# Patient Record
Sex: Female | Born: 1940
Health system: Southern US, Community
[De-identification: ages and names within clinical notes are randomized; demographics above are authoritative.]

## PROBLEM LIST (undated history)

## (undated) DIAGNOSIS — E7800 Pure hypercholesterolemia, unspecified: Secondary | ICD-10-CM

## (undated) DIAGNOSIS — E559 Vitamin D deficiency, unspecified: Secondary | ICD-10-CM

## (undated) DIAGNOSIS — I10 Essential (primary) hypertension: Secondary | ICD-10-CM

## (undated) DIAGNOSIS — C50919 Malignant neoplasm of unspecified site of unspecified female breast: Secondary | ICD-10-CM

## (undated) HISTORY — DX: Pure hypercholesterolemia, unspecified: E78.00

## (undated) HISTORY — DX: Malignant neoplasm of unspecified site of unspecified female breast: C50.919

## (undated) HISTORY — DX: Vitamin D deficiency, unspecified: E55.9

## (undated) HISTORY — PX: OTHER SURGICAL HISTORY: SHX169

## (undated) HISTORY — PX: VESICOVAGINAL FISTULA CLOSURE W/ TAH: SUR271

## (undated) HISTORY — DX: Essential (primary) hypertension: I10

## (undated) HISTORY — PX: ABDOMINAL HYSTERECTOMY: SHX81

---

## 2001-01-11 ENCOUNTER — Encounter: Payer: Self-pay | Admitting: Family Medicine

## 2001-01-11 ENCOUNTER — Ambulatory Visit (HOSPITAL_COMMUNITY): Admission: RE | Admit: 2001-01-11 | Discharge: 2001-01-11 | Payer: Self-pay | Admitting: Family Medicine

## 2001-12-10 ENCOUNTER — Ambulatory Visit (HOSPITAL_COMMUNITY): Admission: RE | Admit: 2001-12-10 | Discharge: 2001-12-10 | Payer: Self-pay | Admitting: Family Medicine

## 2001-12-10 ENCOUNTER — Encounter: Payer: Self-pay | Admitting: Family Medicine

## 2003-01-30 ENCOUNTER — Ambulatory Visit (HOSPITAL_COMMUNITY): Admission: RE | Admit: 2003-01-30 | Discharge: 2003-01-30 | Payer: Self-pay | Admitting: Family Medicine

## 2003-01-30 ENCOUNTER — Encounter: Payer: Self-pay | Admitting: Family Medicine

## 2003-02-14 ENCOUNTER — Ambulatory Visit (HOSPITAL_COMMUNITY): Admission: RE | Admit: 2003-02-14 | Discharge: 2003-02-14 | Payer: Self-pay | Admitting: Family Medicine

## 2003-02-14 ENCOUNTER — Encounter: Payer: Self-pay | Admitting: Family Medicine

## 2003-03-04 ENCOUNTER — Encounter: Payer: Self-pay | Admitting: Orthopedic Surgery

## 2004-02-26 ENCOUNTER — Ambulatory Visit (HOSPITAL_COMMUNITY): Admission: RE | Admit: 2004-02-26 | Discharge: 2004-02-26 | Payer: Self-pay | Admitting: Family Medicine

## 2004-06-27 HISTORY — PX: OTHER SURGICAL HISTORY: SHX169

## 2004-08-26 ENCOUNTER — Ambulatory Visit (HOSPITAL_COMMUNITY): Admission: RE | Admit: 2004-08-26 | Discharge: 2004-08-26 | Payer: Self-pay | Admitting: General Surgery

## 2004-09-13 ENCOUNTER — Ambulatory Visit: Payer: Self-pay | Admitting: Orthopedic Surgery

## 2004-09-24 ENCOUNTER — Ambulatory Visit (HOSPITAL_COMMUNITY): Admission: RE | Admit: 2004-09-24 | Discharge: 2004-09-24 | Payer: Self-pay | Admitting: Orthopedic Surgery

## 2004-09-24 ENCOUNTER — Ambulatory Visit: Payer: Self-pay | Admitting: Orthopedic Surgery

## 2004-09-27 ENCOUNTER — Ambulatory Visit: Payer: Self-pay | Admitting: Orthopedic Surgery

## 2004-09-28 ENCOUNTER — Encounter (HOSPITAL_COMMUNITY): Admission: RE | Admit: 2004-09-28 | Discharge: 2004-10-28 | Payer: Self-pay | Admitting: Orthopedic Surgery

## 2004-10-18 ENCOUNTER — Ambulatory Visit: Payer: Self-pay | Admitting: Orthopedic Surgery

## 2004-10-29 ENCOUNTER — Encounter (HOSPITAL_COMMUNITY): Admission: RE | Admit: 2004-10-29 | Discharge: 2004-11-28 | Payer: Self-pay | Admitting: Orthopedic Surgery

## 2004-11-25 ENCOUNTER — Ambulatory Visit: Payer: Self-pay | Admitting: Orthopedic Surgery

## 2004-12-23 ENCOUNTER — Ambulatory Visit: Payer: Self-pay | Admitting: Orthopedic Surgery

## 2005-03-14 ENCOUNTER — Ambulatory Visit: Payer: Self-pay | Admitting: Orthopedic Surgery

## 2005-06-13 ENCOUNTER — Ambulatory Visit: Payer: Self-pay | Admitting: Orthopedic Surgery

## 2005-11-30 ENCOUNTER — Ambulatory Visit: Payer: Self-pay | Admitting: Orthopedic Surgery

## 2005-12-27 ENCOUNTER — Ambulatory Visit (HOSPITAL_COMMUNITY): Admission: RE | Admit: 2005-12-27 | Discharge: 2005-12-27 | Payer: Self-pay | Admitting: Family Medicine

## 2006-03-02 ENCOUNTER — Ambulatory Visit (HOSPITAL_COMMUNITY): Admission: RE | Admit: 2006-03-02 | Discharge: 2006-03-02 | Payer: Self-pay | Admitting: Family Medicine

## 2006-11-08 ENCOUNTER — Ambulatory Visit (HOSPITAL_COMMUNITY): Admission: RE | Admit: 2006-11-08 | Discharge: 2006-11-08 | Payer: Self-pay | Admitting: Family Medicine

## 2007-05-21 ENCOUNTER — Ambulatory Visit (HOSPITAL_COMMUNITY): Admission: RE | Admit: 2007-05-21 | Discharge: 2007-05-21 | Payer: Self-pay | Admitting: Family Medicine

## 2007-06-28 HISTORY — PX: TOTAL KNEE ARTHROPLASTY: SHX125

## 2008-07-16 ENCOUNTER — Ambulatory Visit (HOSPITAL_COMMUNITY): Admission: RE | Admit: 2008-07-16 | Discharge: 2008-07-16 | Payer: Self-pay | Admitting: Family Medicine

## 2008-07-16 IMAGING — MG MM DIGITAL SCREENING
4 series · 4 of 4 positions shown · non-contrast
Comparison: Prior studies.

DG SCREEN MAMMOGRAM BILATERAL
Bilateral CC and MLO view(s) were taken.
Prior study comparison: [DATE], bilateral screening mammogram.

DIGITAL SCREENING MAMMOGRAM WITH CAD:

[L CC]
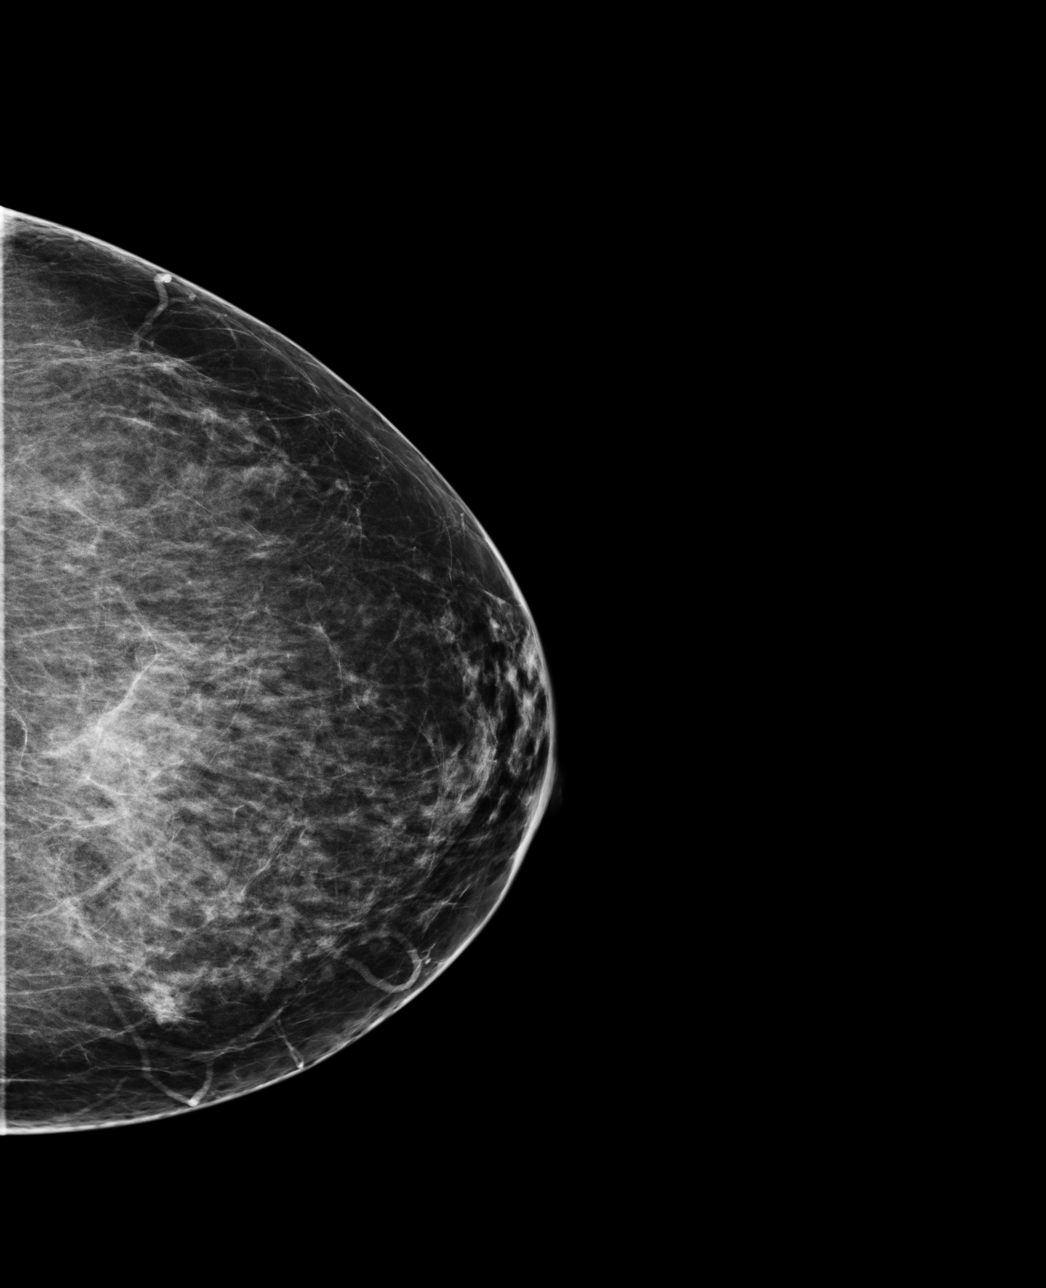

[L MLO]
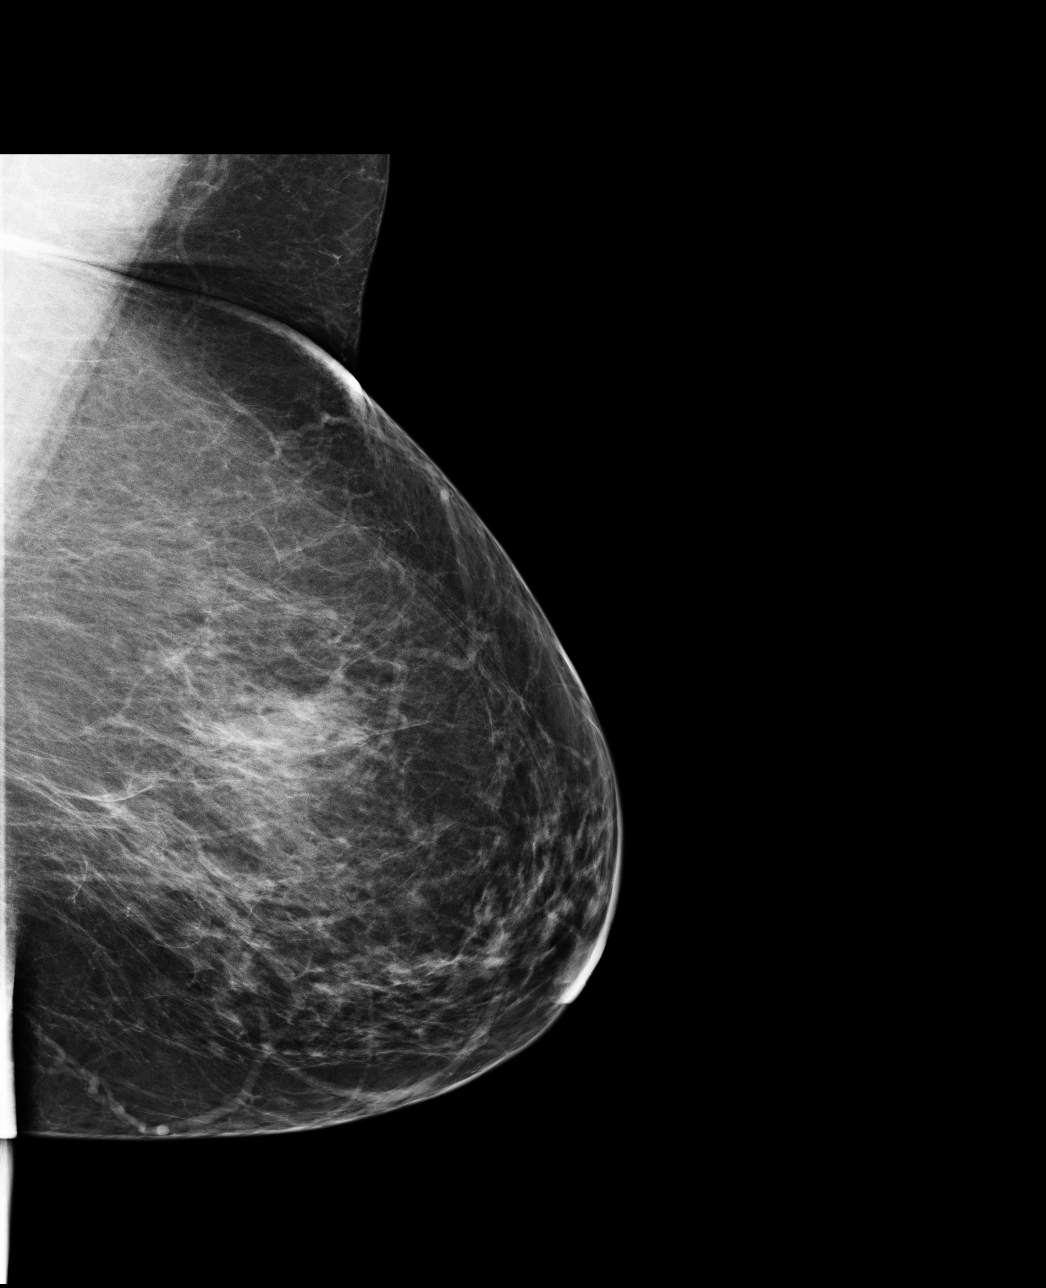

[R CC]
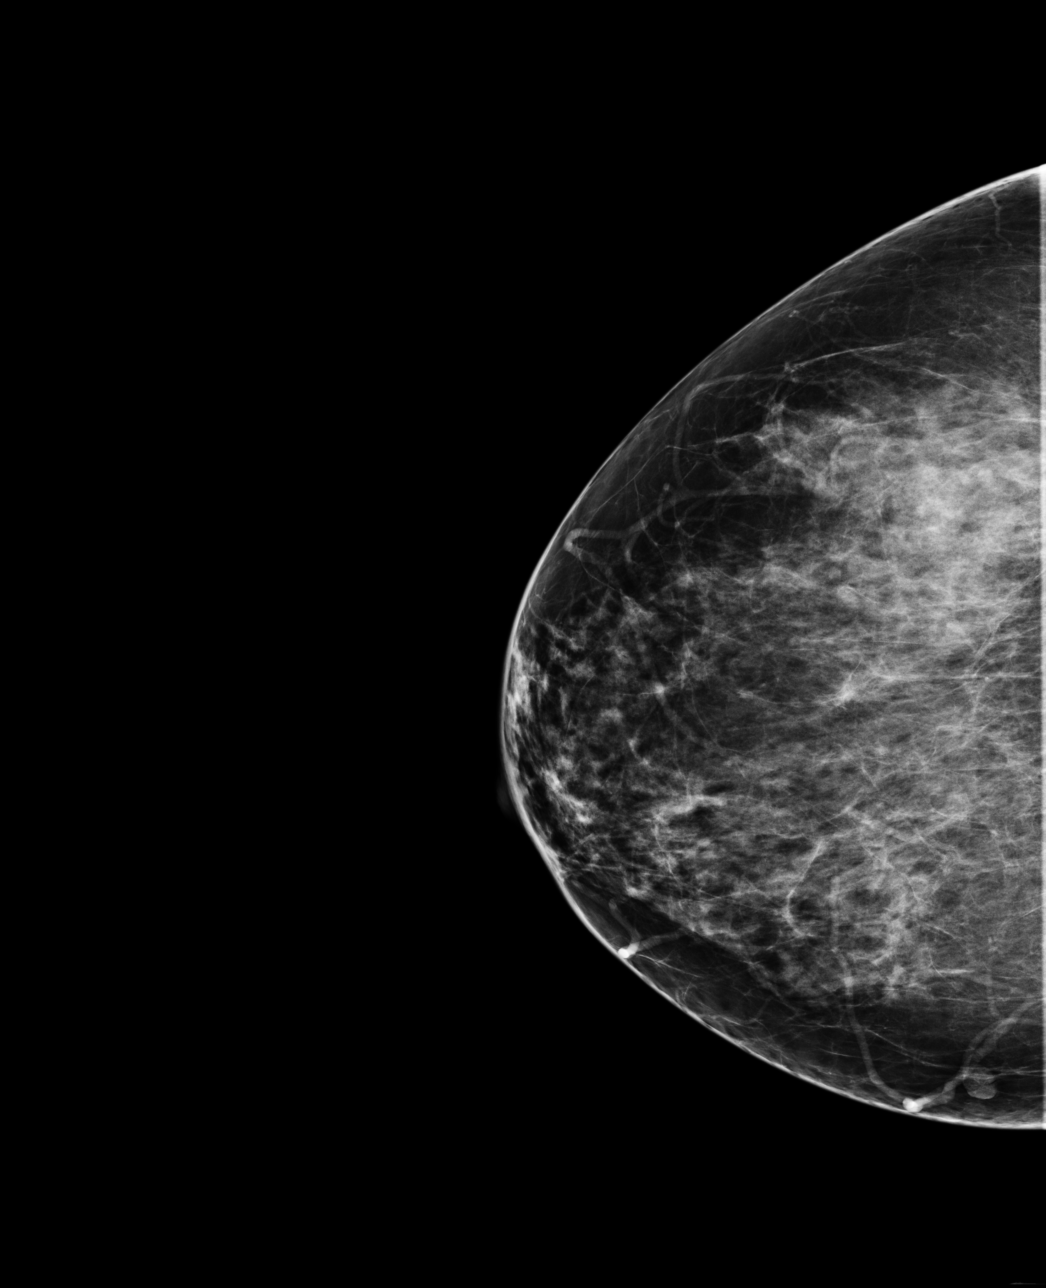

[R MLO]
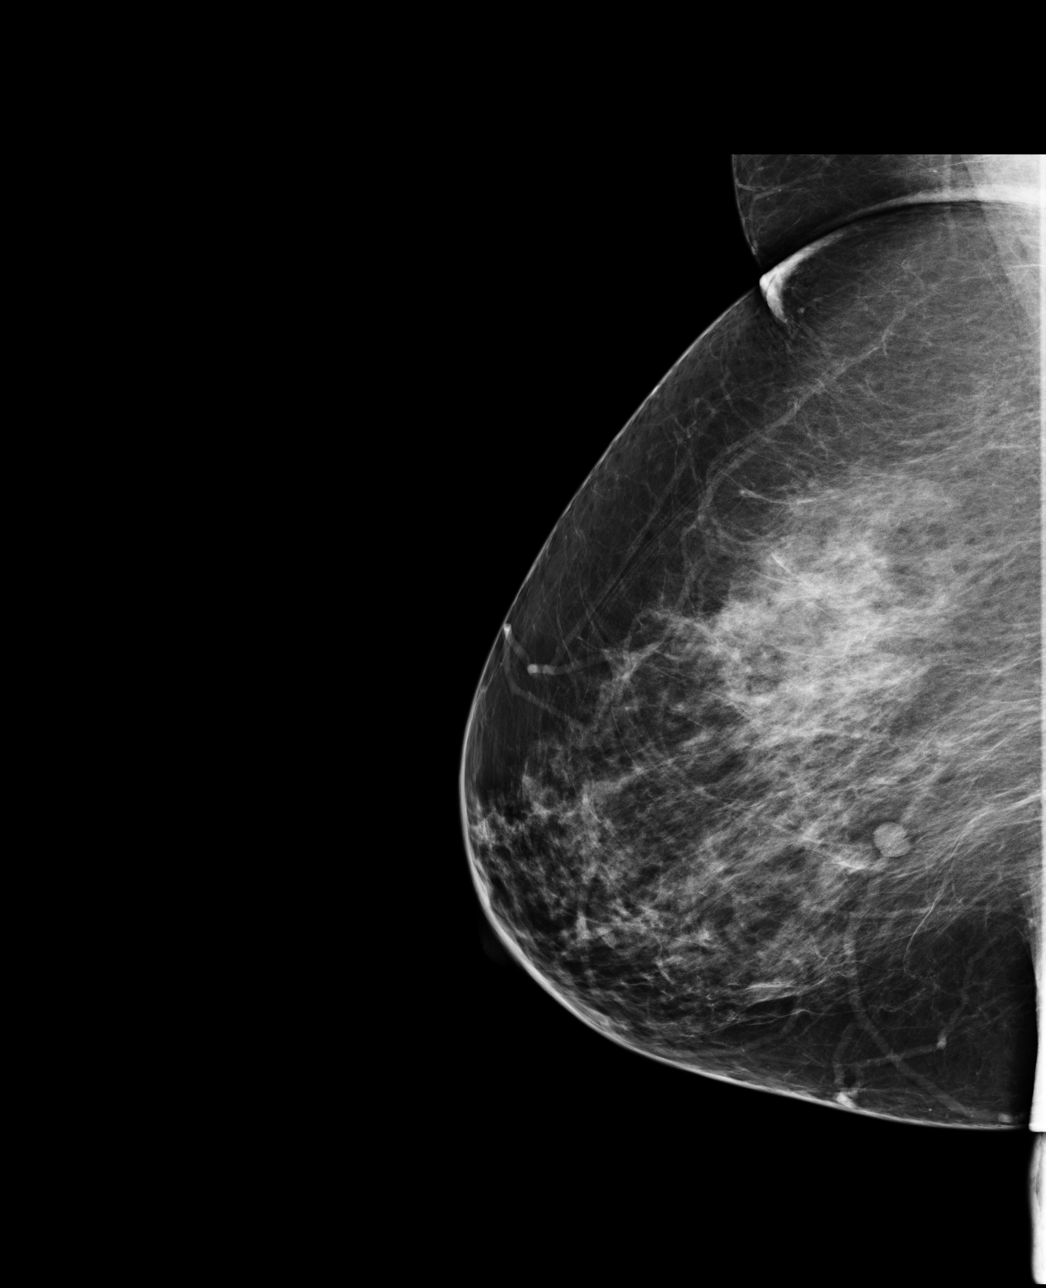

[4 of 4 positions shown; findings below may reference images not displayed]

There are scattered fibroglandular densities.  There is no dominant mass, architectural distortion 
or calcification to suggest malignancy.
IMPRESSION: No mammographic evidence of malignancy.  Suggest yearly screening mammography.

ASSESSMENT: Negative - BI-RADS 1

Screening mammogram in 1 year.
THIS WAS ANALAYZED BY COMPUTER AIDED DETECTION. , THIS PROCEDURE WAS A DIGITAL MAMMOGRAM.

## 2009-07-29 ENCOUNTER — Ambulatory Visit: Payer: Self-pay | Admitting: Orthopedic Surgery

## 2009-07-29 DIAGNOSIS — M129 Arthropathy, unspecified: Secondary | ICD-10-CM | POA: Insufficient documentation

## 2009-08-11 ENCOUNTER — Ambulatory Visit (HOSPITAL_COMMUNITY): Admission: RE | Admit: 2009-08-11 | Discharge: 2009-08-11 | Payer: Self-pay | Admitting: Family Medicine

## 2009-08-11 IMAGING — MG MM DIGITAL SCREENING BILAT W/ CAD
6 series · 6 of 6 positions shown · non-contrast
Comparison: Prior studies.

DG SCREEN MAMMOGRAM BILATERAL
Bilateral CC and MLO view(s) were taken.

DIGITAL SCREENING MAMMOGRAM WITH CAD:

[L CC]
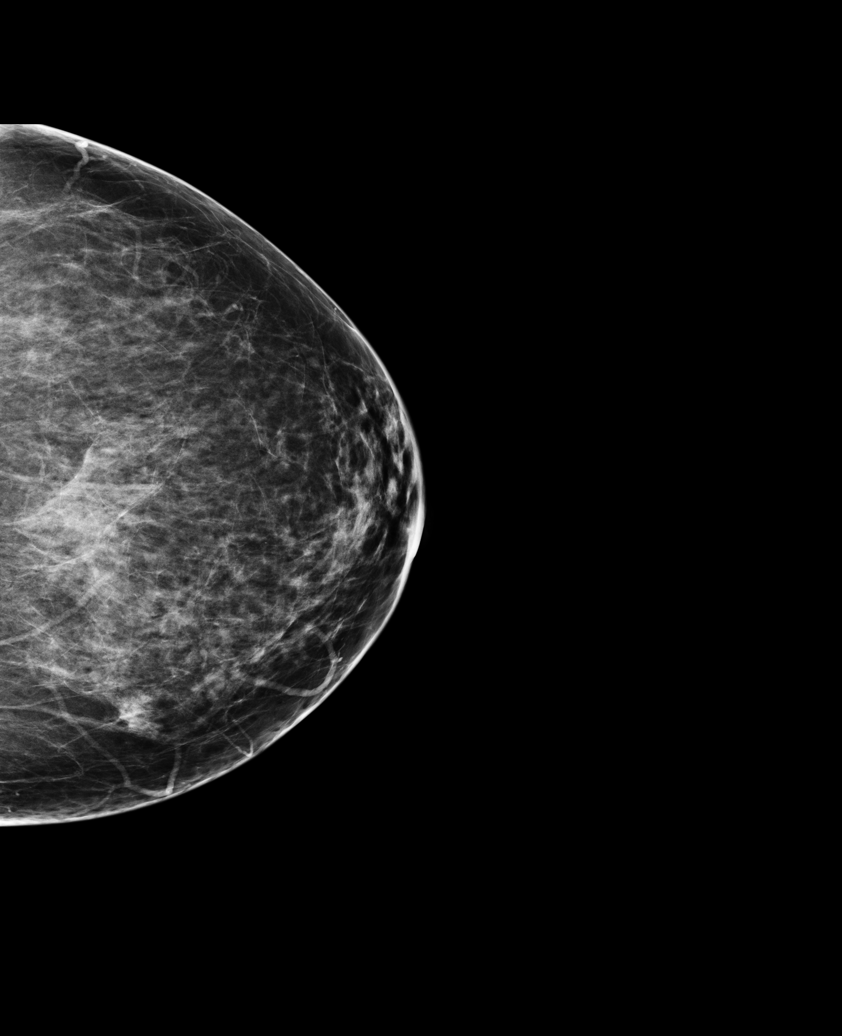

[L MLO (1 of 2)]
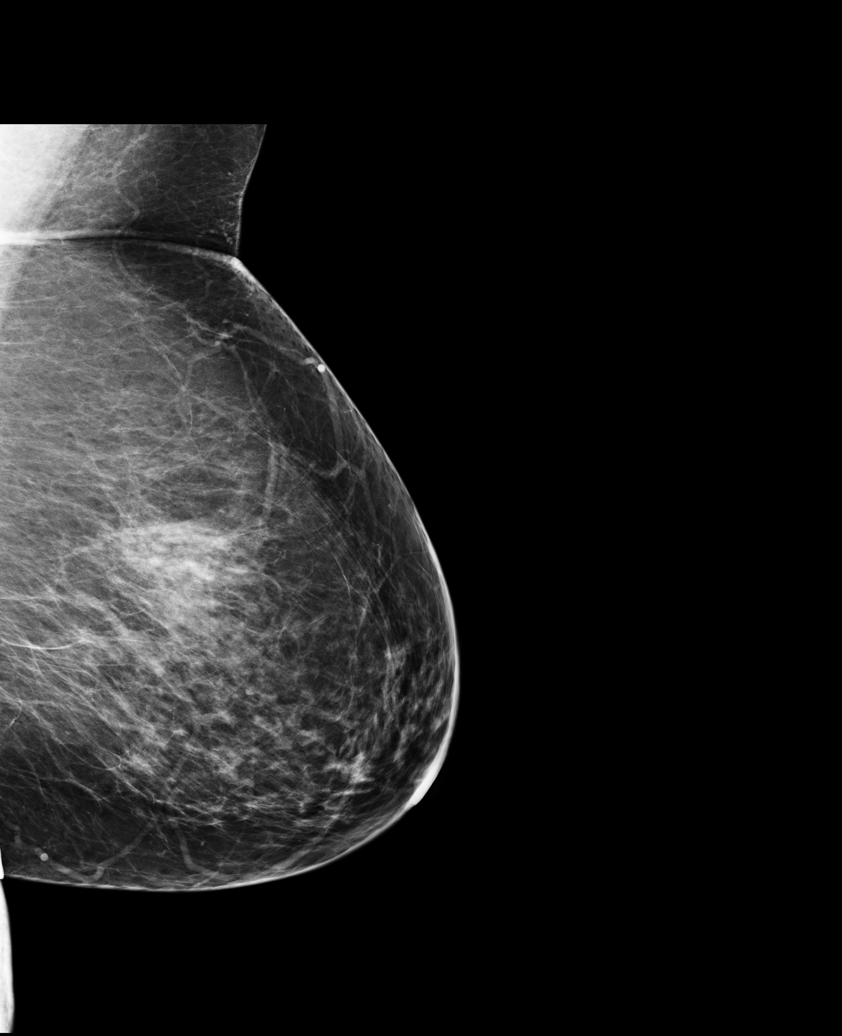

[R CC]
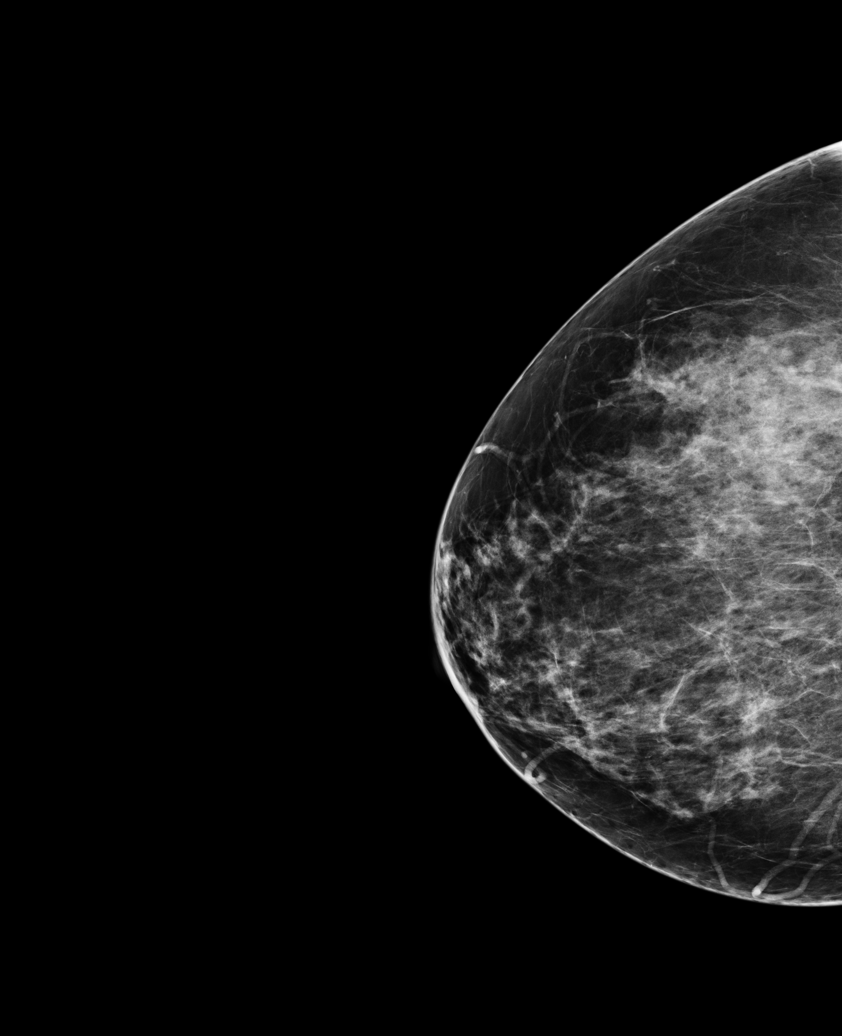

[R MLO (1 of 2)]
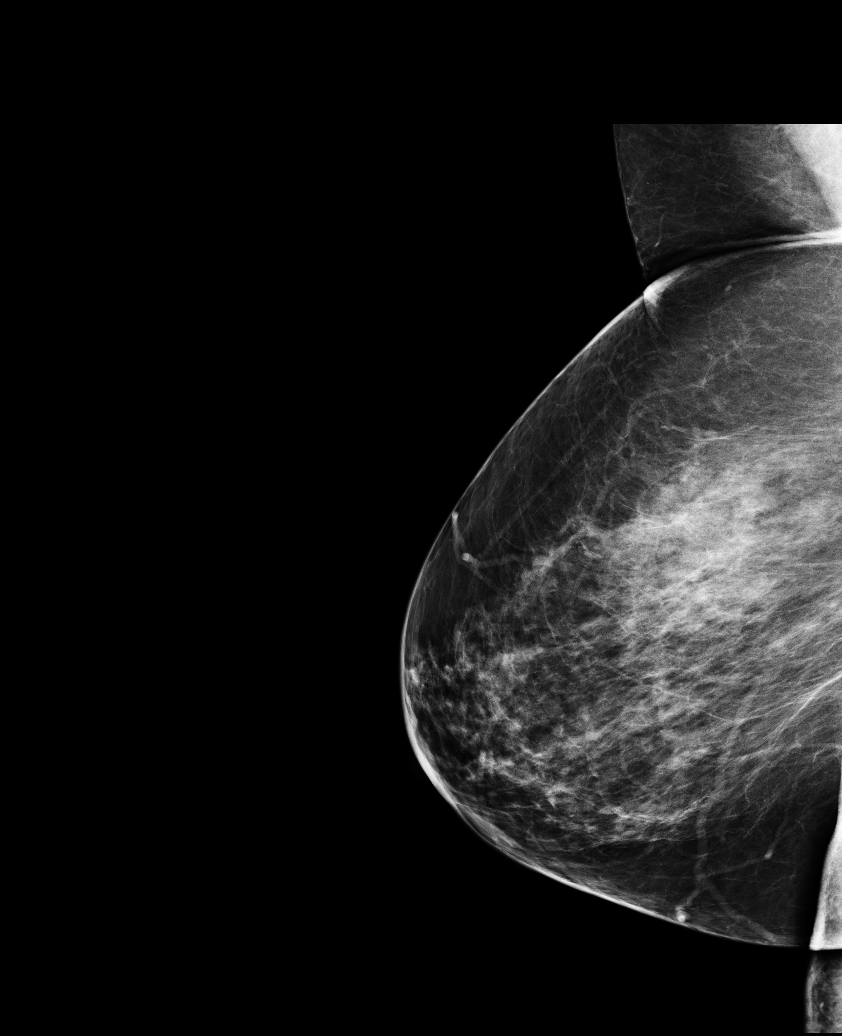

[L MLO (2 of 2)]
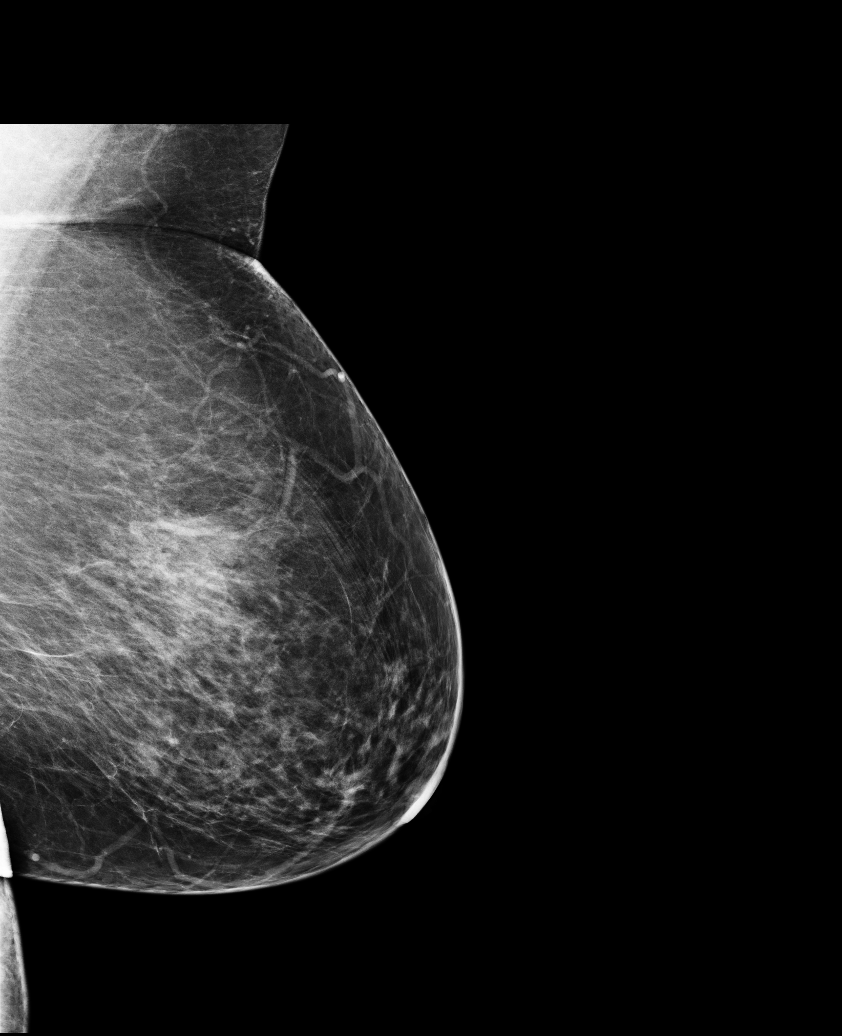

[R MLO (2 of 2)]
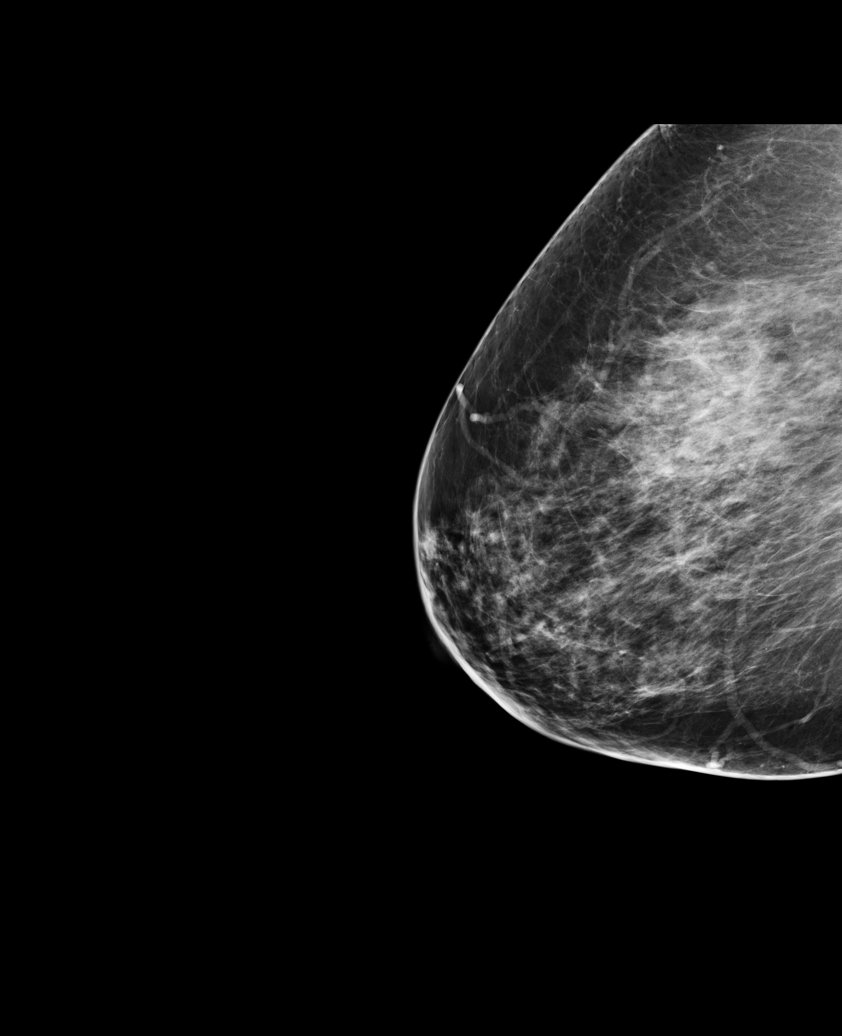

[6 of 6 positions shown; findings below may reference images not displayed]

There are scattered fibroglandular densities.  There is no dominant mass, architectural distortion 
or calcification to suggest malignancy.

Images were processed with CAD.
IMPRESSION: No mammographic evidence of malignancy.  Suggest yearly screening mammography.

A result letter of this screening mammogram will be mailed directly to the patient.

ASSESSMENT: Negative - BI-RADS 1

Screening mammogram in 1 year.
,

## 2009-10-28 ENCOUNTER — Encounter (INDEPENDENT_AMBULATORY_CARE_PROVIDER_SITE_OTHER): Payer: Self-pay | Admitting: *Deleted

## 2009-10-28 ENCOUNTER — Ambulatory Visit: Payer: Self-pay | Admitting: Orthopedic Surgery

## 2009-10-28 DIAGNOSIS — M1710 Unilateral primary osteoarthritis, unspecified knee: Secondary | ICD-10-CM | POA: Insufficient documentation

## 2009-10-30 ENCOUNTER — Encounter (INDEPENDENT_AMBULATORY_CARE_PROVIDER_SITE_OTHER): Payer: Self-pay | Admitting: *Deleted

## 2009-12-07 ENCOUNTER — Encounter: Payer: Self-pay | Admitting: Orthopedic Surgery

## 2009-12-08 ENCOUNTER — Ambulatory Visit: Payer: Self-pay | Admitting: Orthopedic Surgery

## 2009-12-08 ENCOUNTER — Inpatient Hospital Stay (HOSPITAL_COMMUNITY): Admission: RE | Admit: 2009-12-08 | Discharge: 2009-12-11 | Payer: Self-pay | Admitting: Orthopedic Surgery

## 2009-12-08 IMAGING — CR DG KNEE 1-2V PORT*R*
2 series · 2 of 2 positions shown · non-contrast
Comparison: Portable exam [46] hours without priors for comparison

CLINICAL DATA: Osteoarthritis right knee status post knee
replacement

PORTABLE RIGHT KNEE - 1-2 VIEW

[view not recorded (1 of 2)]
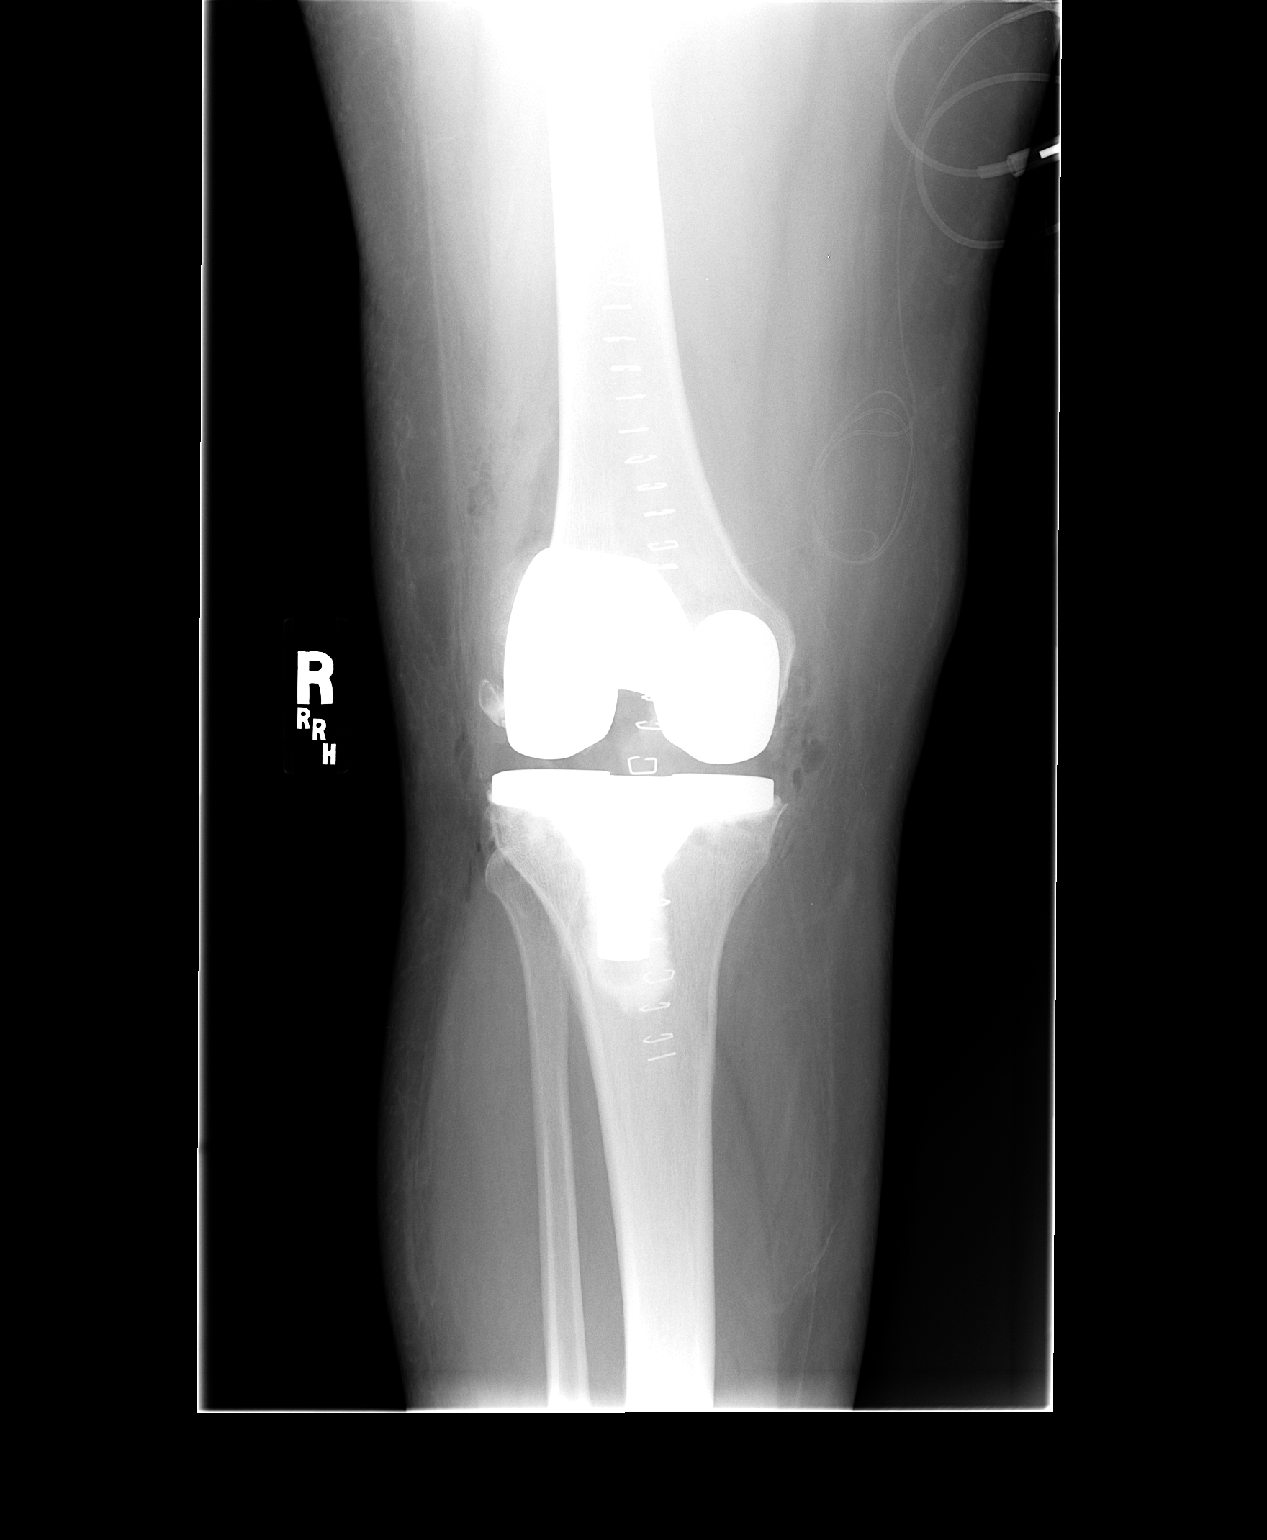

[view not recorded (2 of 2)]
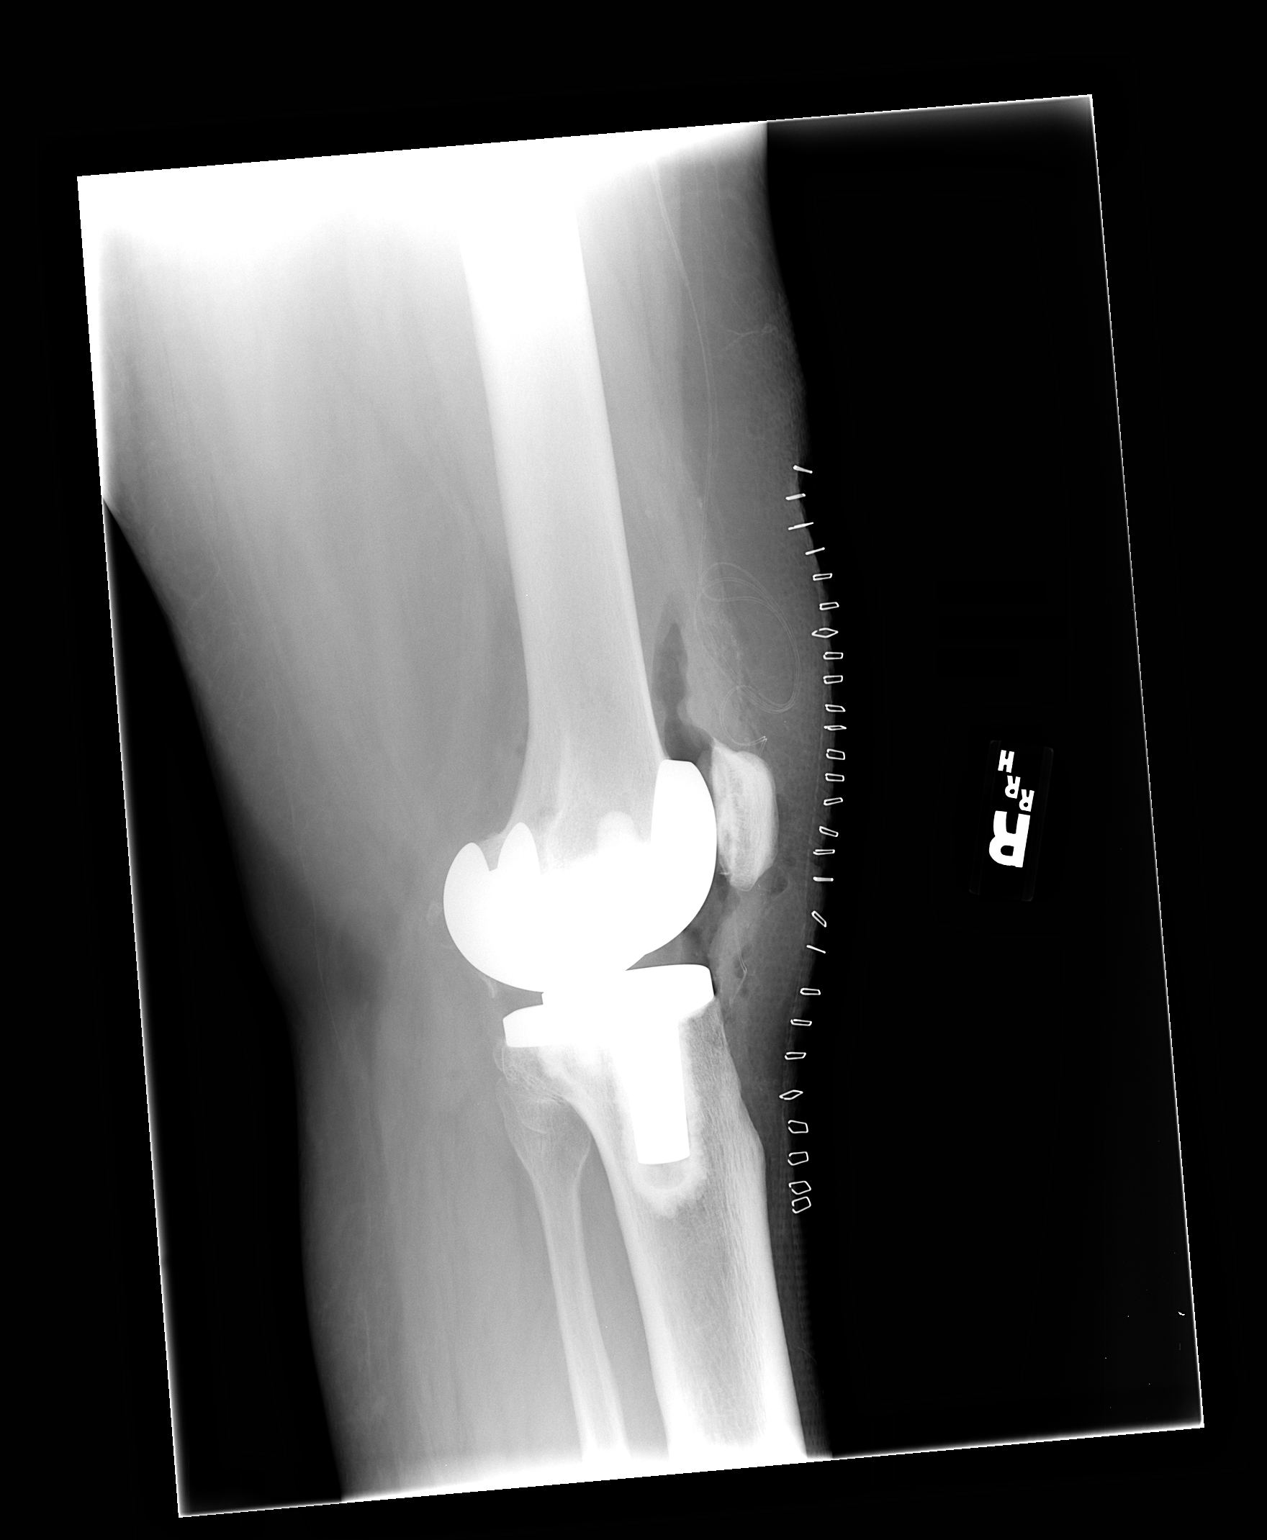

[2 of 2 positions shown; findings below may reference images not displayed]

FINDINGS: Components of right total knee prosthesis identified in expected
positions.
No fracture or bone destruction seen.
Expected soft tissue changes and surgical drain from surgery.
Bones appear demineralized.
Bone fragment or lateral spur identified at lateral margin of
lateral femoral condyle.
IMPRESSION: Right knee prosthesis without acute complication.

## 2009-12-15 ENCOUNTER — Encounter: Payer: Self-pay | Admitting: Orthopedic Surgery

## 2009-12-22 ENCOUNTER — Ambulatory Visit: Payer: Self-pay | Admitting: Orthopedic Surgery

## 2009-12-22 DIAGNOSIS — Z96659 Presence of unspecified artificial knee joint: Secondary | ICD-10-CM | POA: Insufficient documentation

## 2009-12-22 DIAGNOSIS — M216X9 Other acquired deformities of unspecified foot: Secondary | ICD-10-CM | POA: Insufficient documentation

## 2009-12-30 ENCOUNTER — Encounter: Payer: Self-pay | Admitting: Orthopedic Surgery

## 2009-12-31 ENCOUNTER — Ambulatory Visit: Payer: Self-pay | Admitting: Orthopedic Surgery

## 2010-01-07 ENCOUNTER — Encounter: Payer: Self-pay | Admitting: Orthopedic Surgery

## 2010-01-07 ENCOUNTER — Encounter (HOSPITAL_COMMUNITY): Admission: RE | Admit: 2010-01-07 | Discharge: 2010-02-06 | Payer: Self-pay | Admitting: Orthopedic Surgery

## 2010-01-19 ENCOUNTER — Encounter: Payer: Self-pay | Admitting: Orthopedic Surgery

## 2010-01-21 ENCOUNTER — Telehealth: Payer: Self-pay | Admitting: Orthopedic Surgery

## 2010-02-02 ENCOUNTER — Encounter: Payer: Self-pay | Admitting: Orthopedic Surgery

## 2010-02-03 ENCOUNTER — Ambulatory Visit: Payer: Self-pay | Admitting: Orthopedic Surgery

## 2010-02-04 ENCOUNTER — Encounter: Payer: Self-pay | Admitting: Orthopedic Surgery

## 2010-02-09 ENCOUNTER — Encounter (HOSPITAL_COMMUNITY): Admission: RE | Admit: 2010-02-09 | Discharge: 2010-03-11 | Payer: Self-pay | Admitting: Orthopedic Surgery

## 2010-03-04 ENCOUNTER — Ambulatory Visit: Payer: Self-pay | Admitting: Orthopedic Surgery

## 2010-03-08 ENCOUNTER — Encounter: Payer: Self-pay | Admitting: Orthopedic Surgery

## 2010-03-09 ENCOUNTER — Encounter: Payer: Self-pay | Admitting: Orthopedic Surgery

## 2010-03-17 ENCOUNTER — Encounter (HOSPITAL_COMMUNITY): Admission: RE | Admit: 2010-03-17 | Discharge: 2010-03-26 | Payer: Self-pay | Admitting: Orthopedic Surgery

## 2010-03-29 ENCOUNTER — Encounter (HOSPITAL_COMMUNITY): Admission: RE | Admit: 2010-03-29 | Discharge: 2010-04-28 | Payer: Self-pay | Admitting: Orthopedic Surgery

## 2010-04-07 ENCOUNTER — Ambulatory Visit: Payer: Self-pay | Admitting: Orthopedic Surgery

## 2010-04-27 ENCOUNTER — Encounter: Payer: Self-pay | Admitting: Orthopedic Surgery

## 2010-06-09 ENCOUNTER — Ambulatory Visit: Payer: Self-pay | Admitting: Orthopedic Surgery

## 2010-07-27 NOTE — Letter (Signed)
Summary: Generic Letter  [facility]  [address] [place] [address]  [place], [place] [address]   Phone: [phone]  Fax: [phone]    12/07/2009  LODIE WAHEED MR# [id number] DOB: [birth year] Visit Type:  Follow-up Referring Provider:  self Primary Provider:  Dr. Phillips Odor  CC:  right knee pain.  History of Present Illness:     She is a 70 year old woman with the complaint of:  moderate lateral knee pain x 1 year no injury .   She also c/o stiffness, some swelling,  catching and locking,  popping, pain from the  lateral knee to the shin sometimes, but denies numbness in leg, does have  numbness in her feet   Previous medications : Tylenol, topical creme and mobic and tylenol # 3     Meds: Meds: Bisoprolol/HCTZ, Aspirin,  Lasix,  Simvastatin, Vitamin D 50,000 units SO, Mobic 15 mg.  Allergies: 1)  ! Tetracycline  Past History:  Past Medical History: Last updated: 07/29/2009 HTN high cholesterol  Past Surgical History: Last updated: 07/29/2009 Hysterectomoy Small toe surgery left great toe bunion left knee arthroscopy Romeo Apple 2006  Family History: Last updated: 07/29/2009 Family History of Diabetes  Social History: Last updated: 07/29/2009 Patient is married.  no smoking no alcohol occasional caffeine use  Review of Systems Constitutional:  Denies weight loss, weight gain, fever, chills, and fatigue. Cardiovascular:  Denies chest pain, palpitations, fainting, and murmurs. Respiratory:  Denies short of breath, wheezing, couch, tightness, pain on inspiration, and snoring . Gastrointestinal:  Denies heartburn, nausea, vomiting, diarrhea, constipation, and blood in your stools. Genitourinary:  Denies frequency, urgency, difficulty urinating, painful urination, flank pain, and bleeding in urine. Neurologic:  Denies numbness, tingling, unsteady gait, dizziness, tremors, and seizure. Musculoskeletal:  See HPI. Endocrine:  Denies excessive  thirst, exessive urination, and heat or cold intolerance. Psychiatric:  Denies nervousness, depression, anxiety, and hallucinations. Skin:  Denies changes in the skin, poor healing, rash, itching, and redness. HEENT:  Denies blurred or double vision, eye pain, redness, and watering. Immunology:  Denies seasonal allergies, sinus problems, and allergic to bee stings. Hemoatologic:  Denies easy bleeding and brusing.  Physical Exam  Additional Exam:  Constitutional: vital signs see recorded values. General: normal development, nutrition, and grooming. No deformity. Body Habitus is medium. CDV: Observation and palpation was normal  Lymph: palpation of the lymph nodes were normal Skin: inspection and palpation of the skin revealed no abnormalities  Neuro: coordination: normal              DTR's normal              Sensation was normal  Psyche: Alert and oriented x 3. Mood was normal.  Affect: normal  MSK: Gait: abnormal has severe valgus    The upper extremities have normal appearance, ROM, strength and stability.  Right lower extremity severe valgus  ROM is 5-120 Motor is 5/5  Knee stable incl MCL     Impression & Recommendations:  Problem # 1:  OSTEOARTHRITIS, KNEE, RIGHT, SEVERE (ICD-715.96) Assessment New  Valgus severe right knee OA  RT TKA DePuy  Orders: Est. Patient Level IV (69629)  Patient Instructions: 1)  DOS 12/08/09 2)  Post op 1 in our office on 12/22/09 3)  make sure you stop taking plavix, aspirin or other blood thinners a week before surgery 12/01/09 4)  Informed consent process: I have discussed the procedure with the patient. I have answered their questions. The risks of bleeding,  infection, nerve and vascualr injury have been discussed. The diagnosis and reason for surgery have been explained. The patient demonstrates understanding of this discussion. Specific to this procedure risks include:  5)  stiffness 6)  infection / can cause amputation 7)  blood  clots 8)  embolism  9)  pain

## 2010-07-27 NOTE — Miscellaneous (Signed)
Summary: [name]  [facility]   Imported By: Jacklynn Ganong 02/02/2010 11:15:24  _____________________________________________________________________  External Attachment:    Type:   Image     Comment:   External Document

## 2010-07-27 NOTE — Assessment & Plan Note (Signed)
Summary: 4 WK RE-CK RT KNEE/BLUE MED/CAF   Visit Type:  Follow-up Referring Provider:  self Primary Provider:  Dr. Phillips Odor  CC:  post op knee.  History of Present Illness: a 70 year old female followup visit approximately 10 weeks postop   DOS 12/08/09, TKA.  Meds:  ASA 325, Potassium, lasix, HCTZ, Meloxicam, Vitamin D 3.  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    she is currently in a AFO and that is helping her ambulation.  Her knee doesn't hurt at all.  She started to get some feeling back in the peroneal nerve distribution and also getting some activity of the tibialis anterior and the extensor digitorum especially with gravity removed.  Nees rx for ATBS for dentist, allergic to Tetracycline.  exam shows that she has correction of valgus deformity, 110 of flexion with full extension.  With gravity removed she has firing of the tibialis anterior and the extensor digitorum.  She can move her toes in extension well she just has weak ankle dorsiflexion at this point       Allergies: 1)  ! Tetracycline   Impression & Recommendations:  Problem # 1:  TOTAL KNEE FOLLOW-UP (ICD-V43.65)  Orders: Post-Op Check ([address])  Problem # 2:  FOOT DROP, RIGHT (ICD-736.79)  Orders: Post-Op Check (60454)  Problem # 3:  OSTEOARTHRITIS, KNEE, RIGHT, SEVERE (ICD-715.96)  Orders: Post-Op Check (09811)  Patient Instructions: 1)  Please schedule a follow-up appointment in 1 month.

## 2010-07-27 NOTE — Miscellaneous (Signed)
Summary: faxed mm and gentiva for surgery info  Clinical Lists Changes

## 2010-07-27 NOTE — Miscellaneous (Signed)
Summary: PT progress note  PT progress note   Imported By: Jacklynn Ganong 04/08/2010 08:08:50  _____________________________________________________________________  External Attachment:    Type:   Image     Comment:   External Document

## 2010-07-27 NOTE — Progress Notes (Signed)
Summary: Initial evaluation  Initial evaluation   Imported By: Jacklynn Ganong 07/28/2009 11:25:12  _____________________________________________________________________  External Attachment:    Type:   Image     Comment:   External Document

## 2010-07-27 NOTE — Op Note (Signed)
Summary: Left knee arthroscopy  Left knee arthroscopy   Imported By: Jacklynn Ganong 07/28/2009 11:22:55  _____________________________________________________________________  External Attachment:    Type:   Image     Comment:   External Document

## 2010-07-27 NOTE — Assessment & Plan Note (Signed)
Summary: 1 M RE-CK RT KNEE/ANKLE//RESP TO MED/BL MED/CAF   Visit Type:  Follow-up Referring Provider:  self Primary Provider:  Dr. Phillips Odor  CC:  recheck rt ankle and knee.  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  Meds:  ASA 325, Potassium, lasix, HCTZ, Vitamin D 3.  she notes that she still has some numbness in her foot and her toes at night but that the numbness is getting better the pain is minimal and she started to get some dorsiflexion in her foot.  Exam shows 120 of knee flexion with full extension and correction of producing a valgus deformity.  She does have 3/5 strength in the dorsiflexors and the extensors of the foot.  Continue with AFO return 2 months         Allergies: 1)  ! Tetracycline   Impression & Recommendations:  Problem # 1:  TOTAL KNEE FOLLOW-UP (ICD-V43.65)  Orders: Est. Patient Level II ([address])  Problem # 2:  FOOT DROP, RIGHT (ICD-736.79)  Orders: Est. Patient Level II (60[address])  Problem # 3:  OSTEOARTHRITIS, KNEE, RIGHT, SEVERE (ICD-715.96)  Orders: Est. Patient Level II (09811)  Patient Instructions: 1)  BRACE CONTINUE AND RETURN IN 2 MONTHS

## 2010-07-27 NOTE — Miscellaneous (Signed)
Summary: Request for pre-authorization in-patient surgery TKA  Clinical Lists Changes  ontacted insurer [facility] Medicare re: in-patient surgery scheduled at [facility]  12/08/09 - Total Knee Arthroplasty, RT CPT [id number], dx [id number].  Ph [phone] / Per Trinna Post, Fax Clinicals to Fax [phone].  Done.  11/05/09 Called [employer] to fol/up. Spoke w/Leslie Barrera in [employer]; states Leslie Barrera is nurse reviewer - still in review for this date of service. We will be notified.  11/09/09 - Rec'd AUTH # [id number], per Leslie Barrera Medicare. Her direct ph#: [phone].

## 2010-07-27 NOTE — Miscellaneous (Signed)
Summary: [name] progress report  [name] progress report   Imported By: Jacklynn Ganong 01/04/2010 13:26:12  _____________________________________________________________________  External Attachment:    Type:   Image     Comment:   External Document

## 2010-07-27 NOTE — Assessment & Plan Note (Signed)
Summary: rt knee pain needs xr/blue med/bsf   Vital Signs:  Patient profile:   70 year old female Weight:      201 pounds Pulse rate:   68 / minute Resp:     16 per minute  Vitals Entered By: Fuller Canada MD (July 29, 2009 9:57 AM)  Visit Type:  Initial Consult Referring Provider:  self Primary Provider:  Dr. Phillips Odor  CC:  right knee pain.  History of Present Illness: I saw Leslie Barrera in the office today for an initial visit.  She is a 70 years old woman with the complaint of:  right knee pain, lateral.  moderate pain   Pain for a year, no injury.  Has stiffness, some swelling, has catching and locking, has popping, has pain lateral knee to shin sometimes, no numbness in leg, has numbness of the feet.  Takes Tylenol as needed helps, uses topical arthritis creme and this helps alot.  No injections or therapy.  Will have xrays today.  Meds: Bisoprolol/HCTZ, Aspirin,  Lasix,  Simvastatin.  Allergies (verified): 1)  ! Tetracycline  Past History:  Past Medical History: HTN high cholesterol  Past Surgical History: Hysterectomoy Small toe surgery left great toe bunion left knee arthroscopy Romeo Apple 2006  Family History: Family History of Diabetes  Social History: Patient is married.  no smoking no alcohol occasional caffeine use  Review of Systems General:  Denies weight loss, weight gain, fever, chills, and fatigue. Cardiac :  Complains of chest pain; denies angina, heart attack, heart failure, poor circulation, blood clots, and phlebitis. Resp:  Denies short of breath, difficulty breathing, COPD, cough, and pneumonia. GI:  Denies nausea, vomiting, diarrhea, constipation, difficulty swallowing, ulcers, GERD, and reflux. GU:  Denies kidney failure, kidney transplant, kidney stones, burning, poor stream, testicular cancer, blood in urine, and . Neuro:  Complains of numbness and unsteady walking; denies headache, dizziness, migraines, weakness, and  tremor. MS:  Complains of joint pain and joint swelling; denies rheumatoid arthritis, gout, bone cancer, osteoporosis, and . Endo:  Denies thyroid disease, goiter, and diabetes. Psych:  Denies depression, mood swings, anxiety, panic attack, bipolar, and schizophrenia. Derm:  Complains of itching; denies eczema and cancer. EENT:  Complains of ears ringing and bleeding gums; denies poor vision, cataracts, glaucoma, poor hearing, vertigo, sinusitis, hoarseness, and toothaches. Immunology:  Denies seasonal allergies, sinus problems, and allergic to bee stings. Lymphatic:  Denies lymph node cancer and lymph edema.  Physical Exam  Extremities:  15-100r  10 -105lt  valgus bilateral   Impression & Recommendations:  Problem # 1:  ARTHRITIS, KNEES, BILATERAL (ICD-716.98) Assessment New  x-rays 3 views RIGHT knee  Lateral compartment bone to bone, cyst formation, sclerotic bone, multiple osteophytes, valgus alignment  Impression severe valgus osteophytes  Recommended total knee replacement patient is ready in the meantime treatment, codeine for pain patient advised that her total joint class, patient given total knee handout.  Other Orders: Knee x-ray,  3 views (44034) Consultation Level III (74259) Joint Aspirate / Injection, Large (20610) Depo- Medrol 40mg  (D6387)  Patient Instructions: 1)  return in May to schedule TKA

## 2010-07-27 NOTE — Assessment & Plan Note (Signed)
Summary: 3 M RE-CK/DISCUSS SCHED TKA SURG/BLUE MED/CAF   Visit Type:  Follow-up Referring Provider:  self Primary Provider:  Dr. Phillips Odor  CC:  right knee pain.  History of Present Illness:     She is a 70 years old woman with the complaint of:  right knee pain, lateral. Moderate pain   Pain for a year, no injury.  Has stiffness, some swelling, has catching and locking, has popping, has pain lateral knee to shin sometimes, no numbness in leg, has numbness of the feet.  Took Tylenol, topical creme and mobic and tylenol # 3  No injections or therapy.  Meds: Meds: Bisoprolol/HCTZ, Aspirin,  Lasix,  Simvastatin, Vitamin D 50,000 units SO, Mobic 15 mg.  Allergies: 1)  ! Tetracycline  Past History:  Past Medical History: Last updated: 07/29/2009 HTN high cholesterol  Past Surgical History: Last updated: 07/29/2009 Hysterectomoy Small toe surgery left great toe bunion left knee arthroscopy Romeo Apple 2006  Family History: Last updated: 07/29/2009 Family History of Diabetes  Social History: Last updated: 07/29/2009 Patient is married.  no smoking no alcohol occasional caffeine use  Review of Systems Constitutional:  Denies weight loss, weight gain, fever, chills, and fatigue. Cardiovascular:  Denies chest pain, palpitations, fainting, and murmurs. Respiratory:  Denies short of breath, wheezing, couch, tightness, pain on inspiration, and snoring . Gastrointestinal:  Denies heartburn, nausea, vomiting, diarrhea, constipation, and blood in your stools. Genitourinary:  Denies frequency, urgency, difficulty urinating, painful urination, flank pain, and bleeding in urine. Neurologic:  Denies numbness, tingling, unsteady gait, dizziness, tremors, and seizure. Musculoskeletal:  See HPI. Endocrine:  Denies excessive thirst, exessive urination, and heat or cold intolerance. Psychiatric:  Denies nervousness, depression, anxiety, and hallucinations. Skin:  Denies changes in  the skin, poor healing, rash, itching, and redness. HEENT:  Denies blurred or double vision, eye pain, redness, and watering. Immunology:  Denies seasonal allergies, sinus problems, and allergic to bee stings. Hemoatologic:  Denies easy bleeding and brusing.  Physical Exam  Additional Exam:  Constitutional: vital signs see recorded values. General: normal development, nutrition, and grooming. No deformity. Body Habitus is medium. CDV: Observation and palpation was normal  Lymph: palpation of the lymph nodes were normal Skin: inspection and palpation of the skin revealed no abnormalities  Neuro: coordination: normal              DTR's normal              Sensation was normal  Psyche: Alert and oriented x 3. Mood was normal.  Affect: normal  MSK: Gait: abnormal has severe valgus    The upper extremities have normal appearance, ROM, strength and stability.  Right lower extremity severe valgus  ROM is 5-120 Motor is 5/5  Knee stable incl MCL     Impression & Recommendations:  Problem # 1:  OSTEOARTHRITIS, KNEE, RIGHT, SEVERE (ICD-715.96) Assessment New  Valgus severe right knee OA  RT TKA DePuy  Orders: Est. Patient Level IV (56213)  Patient Instructions: 1)  DOS 12/08/09 2)  Post op 1 in our office on 12/22/09 3)  make sure you stop taking plavix, aspirin or other blood thinners a week before surgery 12/01/09 4)  Informed consent process: I have discussed the procedure with the patient. I have answered their questions. The risks of bleeding, infection, nerve and vascualr injury have been discussed. The diagnosis and reason for surgery have been explained. The patient demonstrates understanding of this discussion. Specific to this procedure risks include:  5)  stiffness  6)  infection / can cause amputation 7)  blood clots 8)  embolism  9)  pain

## 2010-07-27 NOTE — Miscellaneous (Signed)
Summary: PT progress note  PT progress note   Imported By: Jacklynn Ganong 03/09/2010 13:37:39  _____________________________________________________________________  External Attachment:    Type:   Image     Comment:   External Document

## 2010-07-27 NOTE — Medication Information (Signed)
Summary: RX Folder  RX Folder   Imported By: Cammie Sickle 07/31/2009 15:03:37  _____________________________________________________________________  External Attachment:    Type:   Image     Comment:   External Document

## 2010-07-27 NOTE — Assessment & Plan Note (Signed)
Summary: RE-CK/BRACE/POST OP TKA RT/SURG 12/08/09/BL MED/CAF   Visit Type:  Follow-up Referring Provider:  self Primary Provider:  Dr. Phillips Odor  CC:  post op TKA.  History of Present Illness: I saw Leslie Barrera in the office today for a followup visit.  She is a 70 years old woman with the complaint of:  post op 1 TKA right knee, Depuy Implant.; correction of valgus knee  DOS 12/08/09.  POD 23  Meds: Norco 5 once a day, Robaxin 500, ASA 325, Potassium, lasix, HCTZ, Meloxicam, Vitamin D 3.  (Today is a one week recheck after AFO for foot drop right foot.  They could not see her until this past Tuesday, was fitted, has not received brace yet)  knee continues to do well therapy notes indicate improve flexion 120  Patient still has a RIGHT foot drop with some sensory neuropathy dorsum of the foot lateral portal the leg  Patient is encouraged to continue passive range of motion exercises and start electrical stimulation to help this as well as outpatient physical therapy she is encouraged to call me if her brace is not ready in 2 weeks      Allergies: 1)  ! Tetracycline   Impression & Recommendations:  Problem # 1:  TOTAL KNEE FOLLOW-UP (ICD-V43.65) Assessment Improved  SET UP OUT PATIENT PT FOR TKA   WITH RIGHT ANKLE PROM ACHILLES STRETCHING AND E STIM PERONEALS AND ANT COMPARTMENT   Orders: Physical Therapy Referral (PT) Post-Op Check ([address])  Problem # 2:  FOOT DROP, RIGHT (ICD-736.79) Assessment: Unchanged  Orders: Post-Op Check (60454)  Problem # 3:  OSTEOARTHRITIS, KNEE, RIGHT, SEVERE (ICD-715.96)  Patient Instructions: 1)  COME BACK IN A MONTH 2)  IF YOU DO NOT HAVE BRACE WITHIN 2 WEEKS CALL us 3)  [place]

## 2010-07-27 NOTE — Medication Information (Signed)
Summary: Tax adviser   Imported By: Cammie Sickle 03/09/2010 10:01:52  _____________________________________________________________________  External Attachment:    Type:   Image     Comment:   External Document

## 2010-07-27 NOTE — Letter (Signed)
Summary: [facility] medical record request  [facility] medical record request   Imported By: Jacklynn Ganong 05/27/2010 13:56:35  _____________________________________________________________________  External Attachment:    Type:   Image     Comment:   External Document

## 2010-07-27 NOTE — Assessment & Plan Note (Signed)
Summary: POST OP 1/TKA RT/SURG 12/08/09/BLUE MED/CAF   Visit Type:  Follow-up Referring Provider:  self Primary Provider:  Dr. Phillips Odor  CC:  post op 1 TKA right.  History of Present Illness: I saw Leslie Barrera in the office today for a followup visit.  She is a 70 years old woman with the complaint of:  post op 1 TKA right knee, Depuy Implant.; correction of valgus knee  DOS 12/08/09.  POD 14.  Meds: Norco 5, Robaxin 500, ASA 325, Potassium, lasix, HCTZ, Meloxicam, Vitamin D 3.  Leslie Barrera PT progress note for review.  Stopped CPM machine a week, was giving her spasms in stomach and rib area.  Primary problem is patient says she has drop foot and this is confirmed by physical therapist.  We did not know about this in the hospital and I did not remember any recollection of the patient complaining of such.  However this is most likely a peroneal nerve stretch secondary to the valgus correction  She does have full extension of the knee and has 90 of flexion her incision looks pretty good  She does indeed have a footdrop with what appears to be no dorsiflexion against gravity I could not test for gravity removed.  No sensory deficit pulses are good there is no evidence of DVT  Recommend AFO RIGHT foot and ankle until peroneal nerve resolved.  Expect resolution of peroneal nerve but may take up to 6 months  I did explain to the patient because of peroneal nerve neurapraxia is after valgus knee correction  She seem to understand.         Allergies: 1)  ! Tetracycline   Impression & Recommendations:  Problem # 1:  OSTEOARTHRITIS, KNEE, RIGHT, SEVERE (ICD-715.96) Assessment Comment Only  doing reasonably well postop except for the peroneal nerve  Orders: Post-Op Check ([address])  Problem # 2:  FOOT DROP, RIGHT (ICD-736.79) Assessment: New  Orders: Physical Therapy Referral (PT) Post-Op Check (60454)  Problem # 3:  TOTAL KNEE FOLLOW-UP (ICD-V43.65) Assessment:  Comment Only  Orders: Post-Op Check (09811)  Patient Instructions: 1)  get AFO brace for right foot drop, you can get from [employer]. 2)  continue therapy 3)  take hose off 4)  Take ASA 325 one tablet two times a day for 4 more weeks 5)  come back Thursday next week

## 2010-07-27 NOTE — Letter (Signed)
Summary: Letter of medical necessity  Letter of medical necessity   Imported By: Jacklynn Ganong 01/21/2010 11:33:44  _____________________________________________________________________  External Attachment:    Type:   Image     Comment:   External Document

## 2010-07-27 NOTE — Progress Notes (Signed)
Summary: About her brace.  Phone Note Call from Patient   Caller: Dr. Romeo Apple Summary of Call: She states she will get her brace from [facility] on 01-26-10. If you need to talk to her her # [phone]. Initial call taken by: Waldon Reining,  January 21, 2010 1:52 PM

## 2010-07-27 NOTE — Miscellaneous (Signed)
Summary: PT Progress note  PT Progress note   Imported By: Jacklynn Ganong 02/04/2010 09:12:23  _____________________________________________________________________  External Attachment:    Type:   Image     Comment:   External Document

## 2010-07-27 NOTE — Miscellaneous (Signed)
SummaryGenevieve Barrera  Home Care orders  [facility] orders   Imported By: Jacklynn Ganong 12/22/2009 11:27:47  _____________________________________________________________________  External Attachment:    Type:   Image     Comment:   External Document

## 2010-07-27 NOTE — Assessment & Plan Note (Signed)
Summary: 1 M RE-CK RT KNEE/BL MEDICARE/CAF   Visit Type:  Follow-up Referring Provider:  self Primary Provider:  Dr. Phillips Odor  CC:  right knee.  History of Present Illness: I saw Leslie Barrera in the office today for a 1 month followup visit.  She is a 70 years old woman with the complaint of:  right knee.  DOS 12/08/09, TKA.  Meds: Norco 5 once a day, Robaxin 500, ASA 325, Potassium, lasix, HCTZ, Meloxicam, Vitamin D 3.  The patient has received the AFO approximately a week ago.  She did not get any therapy on the ankle not sure why.  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  She is 6 weeks out.  Her knee replacement therapy is going well  Recommend followup with me in 4 weeks start electrical stimulation and physical therapy on the ankle.       Allergies: 1)  ! Tetracycline   Impression & Recommendations:  Problem # 1:  TOTAL KNEE FOLLOW-UP (ICD-V43.65) Assessment Comment Only  Orders: Post-Op Check (16109)  Problem # 2:  FOOT DROP, RIGHT (ICD-736.79) Assessment: Comment Only  Orders: Post-Op Check (60454)  Patient Instructions: 1)  Therapy will start working with the ankle 2)  come back in 4 weeks recheck knee and ankle

## 2010-07-27 NOTE — Letter (Signed)
Summary: History form  History form   Imported By: Jacklynn Ganong 07/30/2009 16:45:56  _____________________________________________________________________  External Attachment:    Type:   Image     Comment:   External Document

## 2010-07-27 NOTE — Medication Information (Signed)
Summary: Tax adviser   Imported By: Cammie Sickle 01/01/2010 08:47:20  _____________________________________________________________________  External Attachment:    Type:   Image     Comment:   External Document

## 2010-07-27 NOTE — Miscellaneous (Signed)
Summary: PT clinical evaluation  PT clinical evaluation   Imported By: Jacklynn Ganong 01/14/2010 09:28:39  _____________________________________________________________________  External Attachment:    Type:   Image     Comment:   External Document

## 2010-07-27 NOTE — Progress Notes (Signed)
Summary: Progress note  Progress note   Imported By: Jacklynn Ganong 07/28/2009 11:24:30  _____________________________________________________________________  External Attachment:    Type:   Image     Comment:   External Document

## 2010-07-29 NOTE — Assessment & Plan Note (Signed)
Summary: 2 M RE-CK FOL'G BRACE/BLUE MED/CAF   Visit Type:  Follow-up Referring Provider:  self Primary Provider:  Dr. Phillips Odor  CC:  footdrop after total knee replacement.  History of Present Illness: 70 year old female status post RIGHT knee replacement associated with footdrop approximately 6 months ago  DOS 12/08/09, TKA.  Meds:  ASA 325, Potassium, lasix, HCTZ, Vitamin D 3.  She was treated with an AFO brace returns now for followup visit    Her knee looks great and feels great.  She has 4/5 dorsiflexion of the ankle now. Major improvement.  Continue bracing out of the house can take the brace off in the house. Followup 2 months to see if we can remove the brace          Allergies: 1)  ! Tetracycline   Impression & Recommendations:  Problem # 1:  TOTAL KNEE FOLLOW-UP (ICD-V43.65) Assessment Comment Only  Orders: Est. Patient Level II (62952)  Problem # 2:  FOOT DROP, RIGHT (ICD-736.79) Assessment: Improved  Orders: Est. Patient Level II (84132)  Patient Instructions: 1)  go back to baby aspirin  2)  brace when outside  3)  return in 2 months    Orders Added: 1)  Est. Patient Level II [44010]

## 2010-08-10 ENCOUNTER — Ambulatory Visit (INDEPENDENT_AMBULATORY_CARE_PROVIDER_SITE_OTHER): Payer: Medicare Other | Admitting: Orthopedic Surgery

## 2010-08-10 ENCOUNTER — Encounter: Payer: Self-pay | Admitting: Orthopedic Surgery

## 2010-08-10 DIAGNOSIS — L600 Ingrowing nail: Secondary | ICD-10-CM | POA: Insufficient documentation

## 2010-08-10 DIAGNOSIS — M216X9 Other acquired deformities of unspecified foot: Secondary | ICD-10-CM

## 2010-08-10 DIAGNOSIS — Z96659 Presence of unspecified artificial knee joint: Secondary | ICD-10-CM

## 2010-08-10 DIAGNOSIS — M1710 Unilateral primary osteoarthritis, unspecified knee: Secondary | ICD-10-CM

## 2010-08-18 NOTE — Assessment & Plan Note (Signed)
Summary: 2 m RE-CK BRACE/BLUE MEDICARE/[facility]   Visit Type:  Follow-up Referring Provider:  self Primary Provider:  Dr. Phillips Odor  CC:  recheck AFO brace.  History of Present Illness: 70 year old female status post RIGHT knee replacement associated with footdrop approximately 6 months ago  DOS 12/08/09, TKA.  A valgus knee, with postop foot drop treated with AFO.  Meds:  ASA 325, Potassium, lasix, HCTZ, Vitamin D 3.  she has regained dorsiflexion in her foot. She has a small amount of numbness on the top of the foot, which is gradually decreasing. She is ambulating without the brace at times with no foot drag her toe drag.  She does complain of ingrown toenail of the RIGHT great toe, which I treated with clipping. I've advised her to remove her brace and ambulate as tolerated,  She'll come back in June for her postoperative x-ray at one year.          Allergies: 1)  ! Tetracycline  Physical Exam  Additional Exam:  RIGHT great toe shows an ingrown nail. No redness, but tenderness on the tibial margin. There are no signs of infection.  RIGHT knee looks good, full extension 110 of flexion. Today. No extensor lag with good strength. Footdrop is resolved with dorsiflexion against gravity, as well as manual resistance. Small amount of numbness on the dorsum of the foot.   Impression & Recommendations:  Problem # 1:  TOTAL KNEE FOLLOW-UP (ICD-V43.65) Assessment Improved  Orders: Est. Patient Level III (16109)  Problem # 2:  FOOT DROP, RIGHT (ICD-736.79) Assessment: Improved  Orders: Est. Patient Level III (60454)  Problem # 3:  INGROWN NAIL (ICD-703.0) Assessment: New  Orders: Est. Patient Level III (09811)  Patient Instructions: 1)  june 2012 xrays right TKA  2)  ok to remove brace    Orders Added: 1)  Est. Patient Level III [91478]

## 2010-09-12 LAB — CBC
HCT: 33.6 % — ABNORMAL LOW (ref 36.0–46.0)
HCT: 33.7 % — ABNORMAL LOW (ref 36.0–46.0)
HCT: 34.3 % — ABNORMAL LOW (ref 36.0–46.0)
Hemoglobin: 11.6 g/dL — ABNORMAL LOW (ref 12.0–15.0)
MCHC: 33.7 g/dL (ref 30.0–36.0)
MCV: 87.4 fL (ref 78.0–100.0)
RDW: 14.1 % (ref 11.5–15.5)
WBC: 10.9 10*3/uL — ABNORMAL HIGH (ref 4.0–10.5)
WBC: 8.7 10*3/uL (ref 4.0–10.5)

## 2010-09-12 LAB — DIFFERENTIAL
Basophils Absolute: 0 10*3/uL (ref 0.0–0.1)
Basophils Absolute: 0 10*3/uL (ref 0.0–0.1)
Basophils Relative: 0 % (ref 0–1)
Basophils Relative: 0 % (ref 0–1)
Basophils Relative: 0 % (ref 0–1)
Eosinophils Absolute: 0 10*3/uL (ref 0.0–0.7)
Eosinophils Absolute: 0.1 10*3/uL (ref 0.0–0.7)
Eosinophils Absolute: 0.2 10*3/uL (ref 0.0–0.7)
Eosinophils Relative: 1 % (ref 0–5)
Lymphocytes Relative: 10 % — ABNORMAL LOW (ref 12–46)
Monocytes Absolute: 1.1 10*3/uL — ABNORMAL HIGH (ref 0.1–1.0)
Monocytes Relative: 10 % (ref 3–12)
Monocytes Relative: 11 % (ref 3–12)
Monocytes Relative: 12 % (ref 3–12)
Neutrophils Relative %: 78 % — ABNORMAL HIGH (ref 43–77)

## 2010-09-12 LAB — BASIC METABOLIC PANEL
BUN: 5 mg/dL — ABNORMAL LOW (ref 6–23)
Calcium: 8.7 mg/dL (ref 8.4–10.5)
Chloride: 104 mEq/L (ref 96–112)
Creatinine, Ser: 0.57 mg/dL (ref 0.4–1.2)
Creatinine, Ser: 0.57 mg/dL (ref 0.4–1.2)
Creatinine, Ser: 0.62 mg/dL (ref 0.4–1.2)
GFR calc Af Amer: >60 mL/min (ref >60)
GFR calc Af Amer: >60 mL/min (ref >60)
Glucose, Bld: 102 mg/dL — ABNORMAL HIGH (ref 70–99)
Potassium: 3.3 mEq/L — ABNORMAL LOW (ref 3.5–5.1)
Potassium: 3.9 mEq/L (ref 3.5–5.1)
Sodium: 137 mEq/L (ref 135–145)
Sodium: 137 mEq/L (ref 135–145)
Sodium: 138 mEq/L (ref 135–145)

## 2010-09-13 LAB — CROSSMATCH: Antibody Screen: NEGATIVE

## 2010-09-13 LAB — BASIC METABOLIC PANEL
CO2: 27 mEq/L (ref 19–32)
Calcium: 9.1 mg/dL (ref 8.4–10.5)
Chloride: 107 mEq/L (ref 96–112)
Creatinine, Ser: 0.63 mg/dL (ref 0.4–1.2)
GFR calc Af Amer: >60 mL/min (ref >60)
Glucose, Bld: 104 mg/dL — ABNORMAL HIGH (ref 70–99)
Sodium: 139 mEq/L (ref 135–145)

## 2010-09-13 LAB — CBC
HCT: 36.5 % (ref 36.0–46.0)
Hemoglobin: 12.2 g/dL (ref 12.0–15.0)
MCHC: 33.5 g/dL (ref 30.0–36.0)
Platelets: 160 10*3/uL (ref 150–400)
RDW: 13.8 % (ref 11.5–15.5)
WBC: 6 10*3/uL (ref 4.0–10.5)

## 2010-09-13 LAB — SURGICAL PCR SCREEN
MRSA, PCR: NEGATIVE
Staphylococcus aureus: NEGATIVE

## 2010-09-13 LAB — DIFFERENTIAL
Basophils Relative: 0 % (ref 0–1)
Lymphs Abs: 1.3 10*3/uL (ref 0.7–4.0)
Monocytes Relative: 15 % — ABNORMAL HIGH (ref 3–12)
Neutrophils Relative %: 60 % (ref 43–77)

## 2010-11-12 NOTE — Op Note (Signed)
NAMEMATYLDA, Leslie Barrera             ACCOUNT NO.:  [id number]   MEDICAL RECORD NO.:  [id number]          PATIENT TYPE:  AMB   LOCATION:  DAY                           FACILITY:  [facility]   PHYSICIAN:  Vickki Hearing, M.D.DATE OF BIRTH:  Oct 17, 1940   DATE OF PROCEDURE:  09/24/2004  DATE OF DISCHARGE:                                 OPERATIVE REPORT   PREOPERATIVE DIAGNOSIS:  Osteoarthritis and lateral meniscal tear, left  knee.   POSTOPERATIVE DIAGNOSIS:  osteoarthritis and lateral meniscal tear, left  knee.   PROCEDURE:  Arthroscopy of the left knee with a lateral meniscal partial  resection and joint debridement.   SURGEON:  Vickki Hearing, M.D.   ANESTHESIA:  Spinal.   SPECIMENS:  None.   ESTIMATED BLOOD LOSS:  Minimal.   COMPLICATIONS:  None.   COUNTS:  Correct.   CONDITION:  The patient went to the recovery room in stable condition.   HISTORY OF PRESENT ILLNESS:  70 year old female with lateral knee pain.  She  had mechanical symptoms.  She had examination consistent with a lateral  meniscal tear and osteoarthritis with valgus malalignment.  She had an MRI  which showed she had osteoarthritis with lateral meniscal tear.  Primary  indication was pain and mechanical symptoms.   FINDINGS:  The lateral compartment was grade 4 tibial and femoral chondral  changes.  There was a lateral meniscal tear primarily of the anterior horn.  There was a moderate amount of synovitis, especially in the patellofemoral  and lateral compartment.  The free edge of the medial meniscus had some mild  fraying, but essentially the medial compartment was relatively normal.  The  patellofemoral compartment had grade 4 changes as well.   DESCRIPTION OF PROCEDURE:  The patient was identified as Leslie Barrera in  the preoperative holding area.  Her left knee was marked as the surgical  site and countersigned by the surgeon.  The history and physical was  updated.  The patient was given 1  g of IV Ancef.  She was taken to the  operating room for a spinal anesthetic and then was placed supine on the  operating table where the left knee was prepped and draped using sterile  technique.  At this time, we took a time out as required, and it was  completed as required, confirming the left knee as the surgical site for a  left knee arthroscopy.  The patient was confirmed as Leslie Barrera.   Using a two-incision technique, a diagnostic arthroscopy was performed.  The  medial compartment was essentially benign, except for some free edge fraying  of the medial meniscus at its free edge.  The ACL/PCL area was normal,  except for synovitis.  The lateral compartment was extremely arthritic with  synovitis, and the patellofemoral compartment was extremely arthritic with  synovitis.   Using a motorized shaver, the free edge fraying of the medial meniscus was  debrided.  The lateral meniscectomy was completed, and a debridement of the  patellofemoral compartment was done as well.   We irrigated the knee and closed the portal sites with  Steri-Strips and then  injected the knee with 30 cc of Marcaine.  We placed sterile dressings and a  Cryo/Cuff and took the patient to the recovery room in stable condition.  The postoperative plan is for full weightbearing, physical therapy for three  to four weeks, and a followup on Monday.      SEH/MEDQ  D:  09/24/2004  T:  09/24/2004  Job:  [id number]

## 2010-11-12 NOTE — H&P (Signed)
Leslie Barrera, Leslie Barrera             ACCOUNT NO.:  [id number]   MEDICAL RECORD NO.:  [id number]          PATIENT TYPE:  AMB   LOCATION:  DAY                           FACILITY:  [facility]   PHYSICIAN:  Vickki Hearing, M.D.DATE OF BIRTH:  03-Jun-1941   DATE OF ADMISSION:  DATE OF DISCHARGE:  LH                                HISTORY & PHYSICAL   CHIEF COMPLAINT:  Left knee pain.   PAST MEDICAL HISTORY:  This is a 70 year old female with no history of  injury.  She had pain for 1-2 years in the left knee.  She had an MRI with  __________ pain and locking with giving way. She had osteoarthritis in the  lateral meniscal tear.  She lived with her pain and symptoms for the last  year and one-half, but has now had more significant symptoms and wishes to  have the arthroscopic surgery.   She has no positive findings under medical system review.  She does have a  history of hypertension.  She has had a hysterectomy, small toe surgery  bilaterally, left great toe bunionectomy.  She takes Ziac and a cholesterol-  lowering agent she could not name.  She has a family history of heart  disease.  She is married, does not smoke, has a college degree.  She is  followed by Dr. Phillips Odor and works as a Surveyor, minerals.   PHYSICAL EXAMINATION:  HEAD, EYES, EARS, NOSE AND THROAT:  Normal.  NECK:  Supple.  CHEST:  Clear.  HEART:  Rate and rhythm normal.  ABDOMEN:  Soft.  EXTREMITIES:  Left knee examination-there is lateral joint line tenderness.  There is valgus alignment to the knee.  There is approximately 125-degrees  of flexion, full extension, stable ligaments.   IMPRESSION AND DIAGNOSIS:  Lateral meniscal tear with osteoarthritis.   PLAN:  Arthroscopy of the left knee.      SEH/MEDQ  D:  09/23/2004  T:  09/23/2004  Job:  [id number]

## 2010-12-09 ENCOUNTER — Ambulatory Visit (INDEPENDENT_AMBULATORY_CARE_PROVIDER_SITE_OTHER): Payer: Medicare Other | Admitting: Orthopedic Surgery

## 2010-12-09 DIAGNOSIS — Z96659 Presence of unspecified artificial knee joint: Secondary | ICD-10-CM

## 2010-12-09 DIAGNOSIS — E876 Hypokalemia: Secondary | ICD-10-CM

## 2010-12-09 MED ORDER — POTASSIUM CHLORIDE CRYS ER 20 MEQ PO TBCR: 20.0000 meq | EXTENDED_RELEASE_TABLET | Freq: Every day | ORAL | Status: DC

## 2010-12-09 NOTE — Progress Notes (Signed)
Separately identifiable. X-ray report.  Total knee replacement annual x-ray.  All 3 components are properly aligned. Overall knee alignment is normal. No signs of loosening.  Impression no complicating findings and his postoperative knee replacement film

## 2010-12-09 NOTE — Progress Notes (Signed)
70 year old female status post RIGHT knee replacement associated with footdrop  DOS 12/08/09, TKA.  She is doing well at this time.  Complaint of LEFT knee pain and she needs her potassium refilled.  Review of systems otherwise negative.  Radiographs taken today show prosthesis in good position.  Followup in one year. Potassium refilled.  Copy Dr. Phillips Odor

## 2010-12-10 ENCOUNTER — Telehealth: Payer: Self-pay | Admitting: Orthopedic Surgery

## 2010-12-10 ENCOUNTER — Other Ambulatory Visit (HOSPITAL_COMMUNITY): Payer: Self-pay | Admitting: Family Medicine

## 2010-12-10 DIAGNOSIS — Z139 Encounter for screening, unspecified: Secondary | ICD-10-CM

## 2010-12-10 NOTE — Telephone Encounter (Signed)
April, pharmacist at [facility] [phone]) called about potassium prescription 12/09/10. States instructions are: 20 m, 1 daily, 1 tablet, 5 refills.  Asking if Dr Romeo Apple intended to write for 30 tablets (Vs.1 tablet) as patient had taken in the past.

## 2010-12-14 NOTE — Telephone Encounter (Signed)
30 tablets

## 2010-12-16 ENCOUNTER — Ambulatory Visit (HOSPITAL_COMMUNITY)
Admission: RE | Admit: 2010-12-16 | Discharge: 2010-12-16 | Disposition: A | Discharge status: Home / Self Care | Payer: Medicare Other | Source: Ambulatory Visit | Attending: Family Medicine | Admitting: Family Medicine

## 2010-12-16 DIAGNOSIS — Z139 Encounter for screening, unspecified: Secondary | ICD-10-CM

## 2010-12-16 DIAGNOSIS — Z1231 Encounter for screening mammogram for malignant neoplasm of breast: Secondary | ICD-10-CM | POA: Insufficient documentation

## 2010-12-16 IMAGING — MG MM DIGITAL SCREENING {[facility]}
5 series · 5 of 5 positions shown · non-contrast
Comparison: none

[L CC]
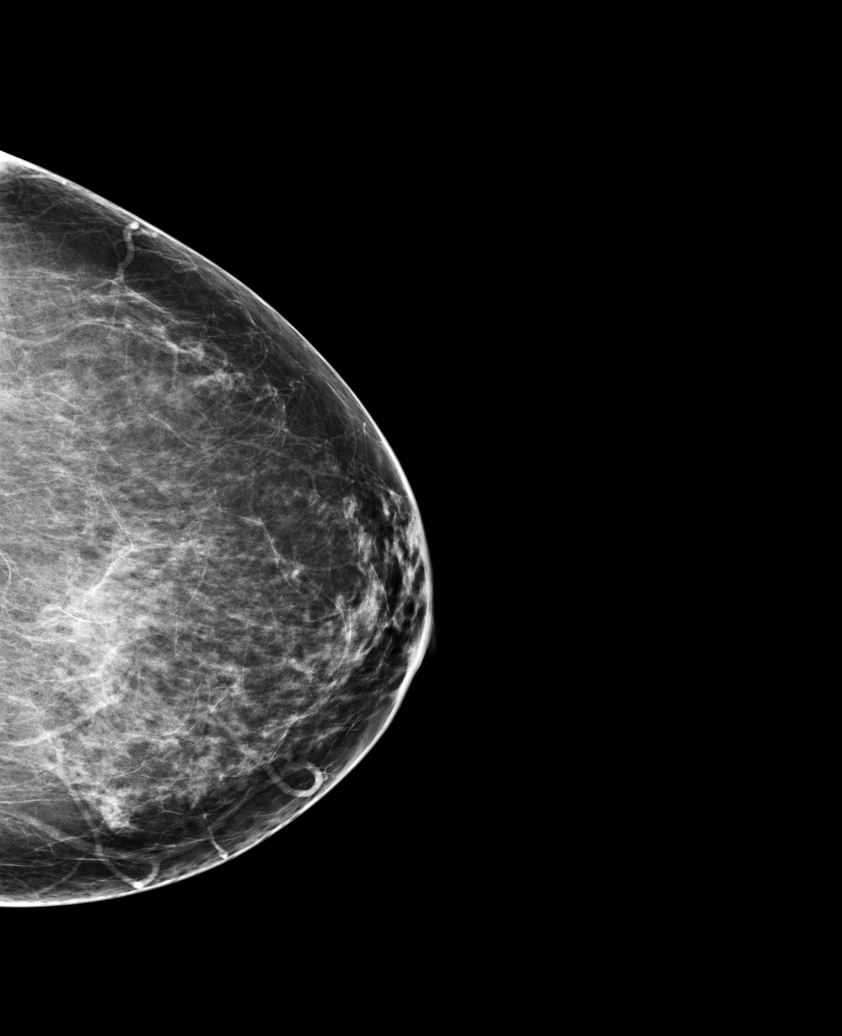

[L MLO]
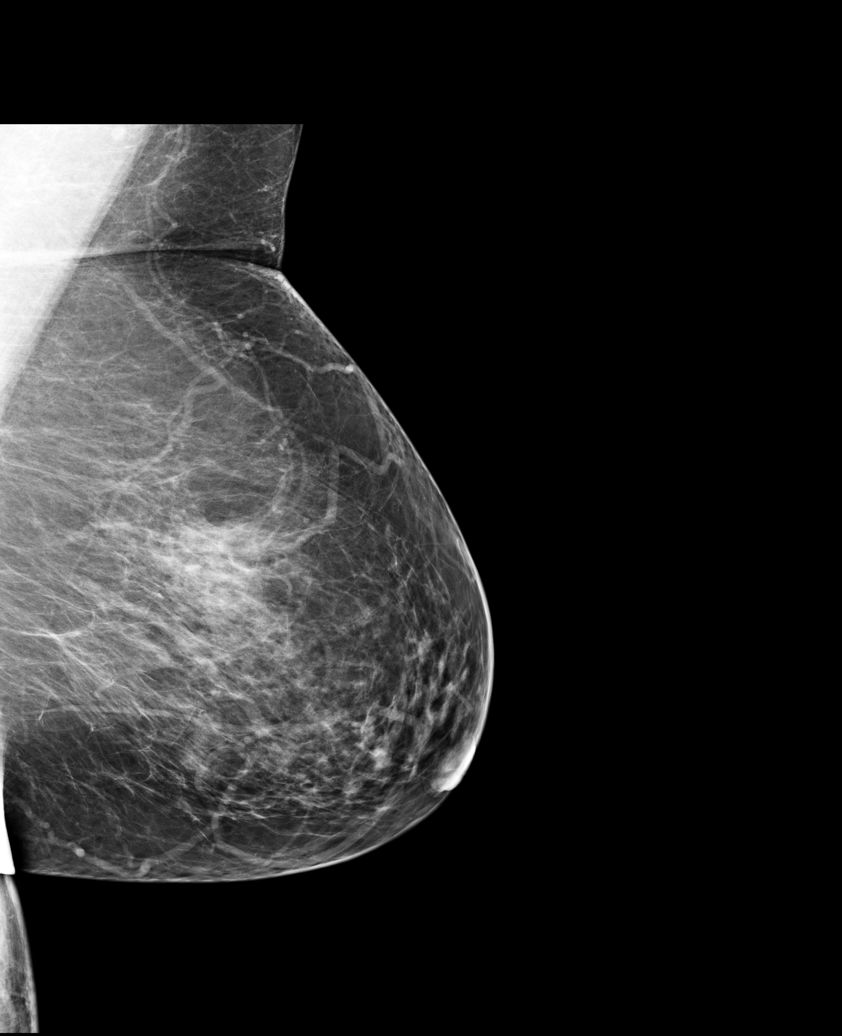

[R CC]
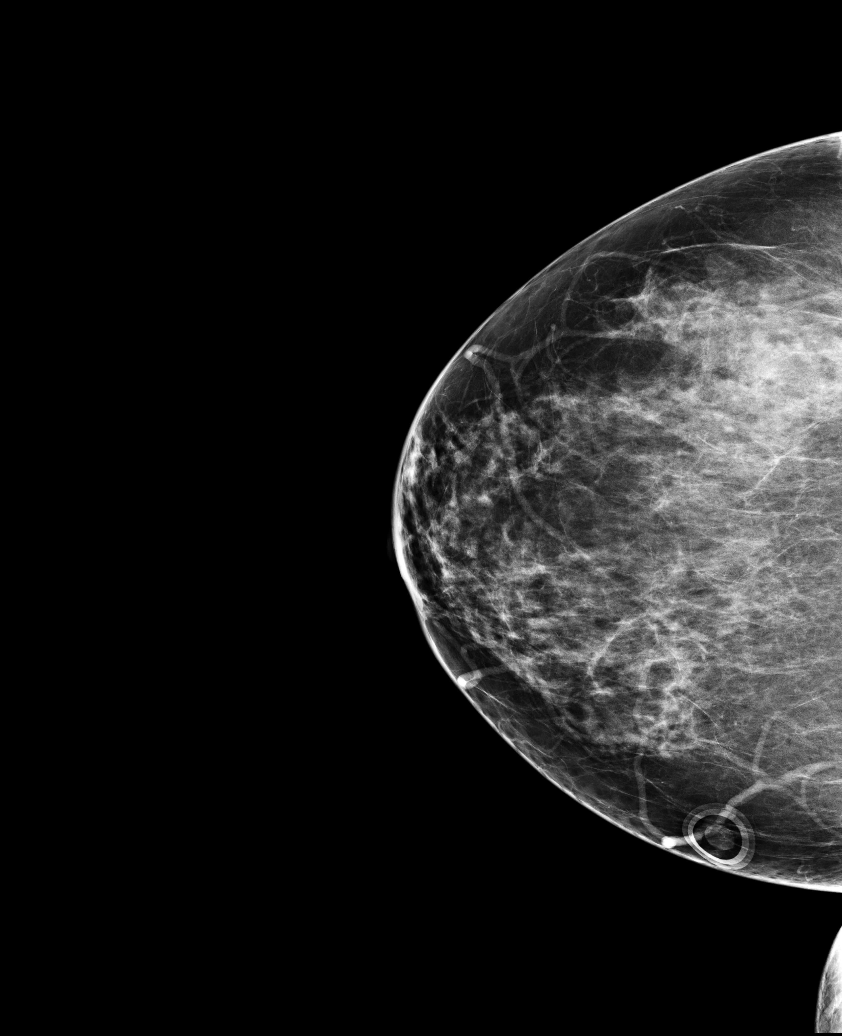

[R MLO (1 of 2)]
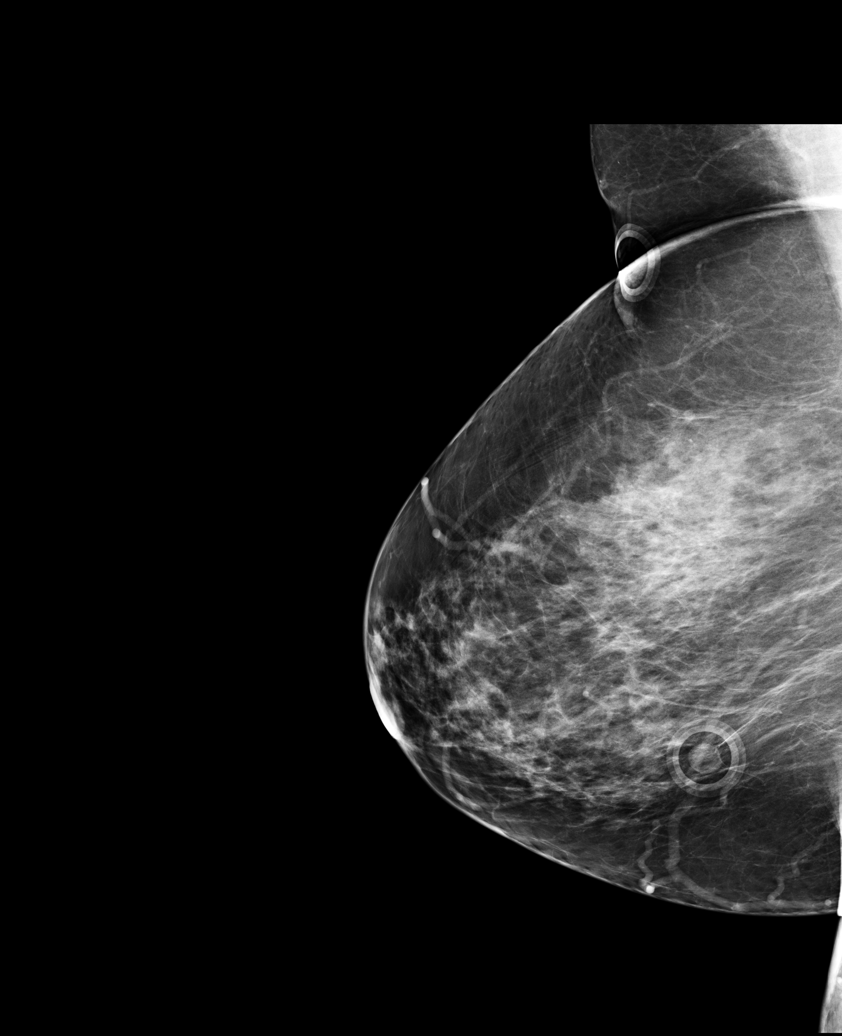

[R MLO (2 of 2)]
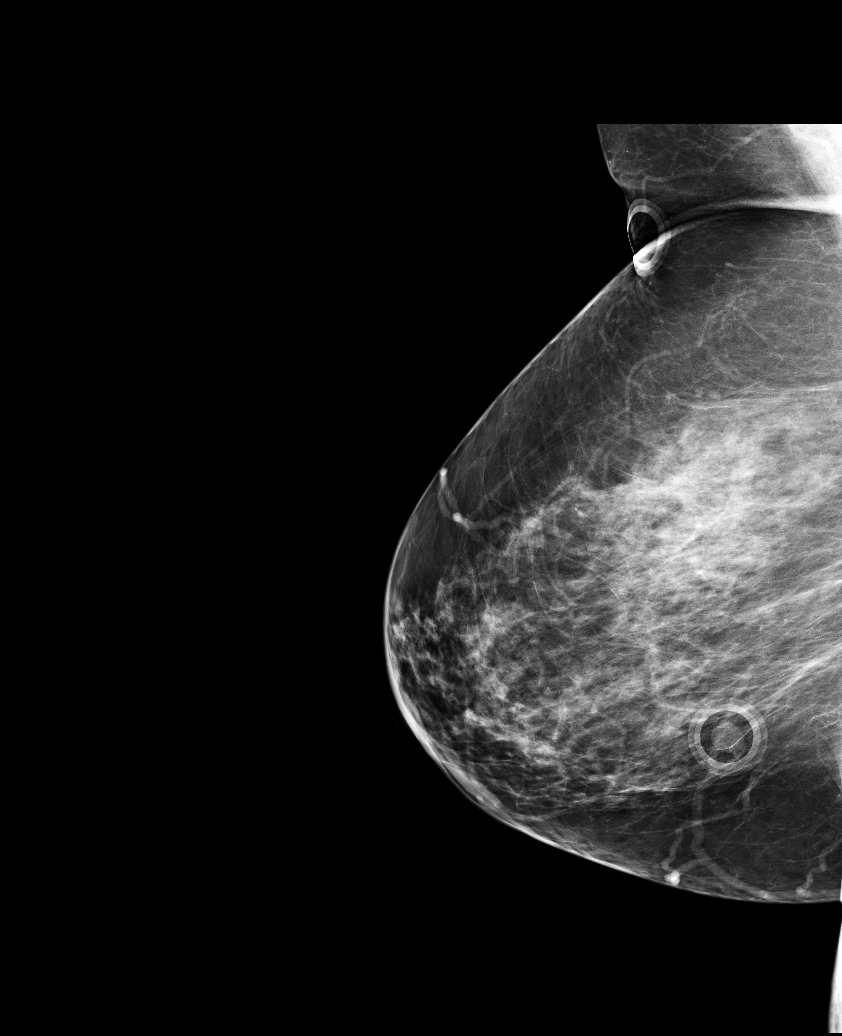

[5 of 5 positions shown; findings below may reference images not displayed]

Canned report from images found in remote index.

Refer to host system for actual result text.

## 2011-04-05 ENCOUNTER — Telehealth: Payer: Self-pay | Admitting: Orthopedic Surgery

## 2011-04-05 NOTE — Telephone Encounter (Signed)
We received a request for the pre-med anti-biotic.  Nurse received.  Patient has called -- notified we are working on it.

## 2011-04-05 NOTE — Telephone Encounter (Signed)
Patient requests prescription for pre-med anti-biotic for dental appointment, today if possible, as her appointment is tomorrow. Pharmacy is [facility]. Patient ph# is [phone].  Please route reply to both nurses.

## 2011-04-06 NOTE — Telephone Encounter (Signed)
It was verbally communicated to nurse upon her return to office yesterday; fax was received; done.

## 2011-04-06 NOTE — Telephone Encounter (Signed)
Should have aske dfor this when she called  Since she needed it that day   Urgent messages should be verbally communicated

## 2011-04-14 ENCOUNTER — Other Ambulatory Visit: Payer: Self-pay | Admitting: *Deleted

## 2011-04-14 MED ORDER — CEPHALEXIN 500 MG PO CAPS: ORAL_CAPSULE | ORAL | Status: AC

## 2011-06-08 ENCOUNTER — Other Ambulatory Visit: Payer: Self-pay | Admitting: *Deleted

## 2011-06-08 MED ORDER — POTASSIUM CHLORIDE CRYS ER 20 MEQ PO TBCR: 20.0000 meq | EXTENDED_RELEASE_TABLET | Freq: Every day | ORAL | Status: DC

## 2011-09-07 ENCOUNTER — Other Ambulatory Visit: Payer: Self-pay | Admitting: Orthopedic Surgery

## 2011-11-07 ENCOUNTER — Other Ambulatory Visit: Payer: Self-pay | Admitting: Orthopedic Surgery

## 2011-11-15 ENCOUNTER — Other Ambulatory Visit (HOSPITAL_BASED_OUTPATIENT_CLINIC_OR_DEPARTMENT_OTHER): Payer: Self-pay | Admitting: Family Medicine

## 2011-11-15 DIAGNOSIS — IMO0001 Reserved for inherently not codable concepts without codable children: Secondary | ICD-10-CM

## 2011-11-25 ENCOUNTER — Other Ambulatory Visit (HOSPITAL_COMMUNITY): Payer: Self-pay | Admitting: Family Medicine

## 2011-11-25 DIAGNOSIS — Z139 Encounter for screening, unspecified: Secondary | ICD-10-CM

## 2011-12-06 ENCOUNTER — Ambulatory Visit (HOSPITAL_COMMUNITY)
Admission: RE | Admit: 2011-12-06 | Discharge: 2011-12-06 | Disposition: A | Discharge status: Home / Self Care | Payer: Medicare Other | Source: Ambulatory Visit | Attending: Family Medicine | Admitting: Family Medicine

## 2011-12-06 DIAGNOSIS — Z139 Encounter for screening, unspecified: Secondary | ICD-10-CM

## 2011-12-06 DIAGNOSIS — E559 Vitamin D deficiency, unspecified: Secondary | ICD-10-CM | POA: Insufficient documentation

## 2011-12-06 DIAGNOSIS — Z13820 Encounter for screening for osteoporosis: Secondary | ICD-10-CM | POA: Insufficient documentation

## 2011-12-13 ENCOUNTER — Ambulatory Visit (INDEPENDENT_AMBULATORY_CARE_PROVIDER_SITE_OTHER): Payer: Medicare Other | Admitting: Orthopedic Surgery

## 2011-12-13 ENCOUNTER — Ambulatory Visit (INDEPENDENT_AMBULATORY_CARE_PROVIDER_SITE_OTHER): Payer: Medicare Other

## 2011-12-13 ENCOUNTER — Encounter: Payer: Self-pay | Admitting: Orthopedic Surgery

## 2011-12-13 VITALS — BP 120/70 | Ht 67.0 in | Wt 207.0 lb

## 2011-12-13 DIAGNOSIS — Z96659 Presence of unspecified artificial knee joint: Secondary | ICD-10-CM

## 2011-12-13 NOTE — Patient Instructions (Addendum)
activities as tolerated 

## 2011-12-13 NOTE — Progress Notes (Signed)
Patient ID: Leslie Barrera, female   DOB: Jun 16, 1941, 71 y.o.   MRN: [id number] Chief Complaint  Patient presents with  . Follow-up    1 year recheck on right knee replacement.   BP 120/70  Ht 5\' 7"  (1.702 m)  Wt 207 lb (93.895 kg)  BMI 32.42 kg/m2  Chief complaint total knee follow-up.  History this is a follow-up visit. Status post RIGHT  total knee replacement. COMPLICATED BY FOOT DROP  NO EVIDENCE OF FOOT DROP, OCCASIONAL PARESTHESIAS  DOS: 6.14.2011  Implant DePuy   Review of systems patient has no complaints.  Exam Physical Exam(6) GENERAL: normal development   CDV: pulses are normal   Skin: normal  Psychiatric: awake, alert and oriented  Neuro: normal sensation  1 ambulation NORMAL NO DEVICES  2 ROM = 110 3 Motor normal  4 Stability normal   Separate x-ray report.  Reason for x-ray, and we'll x-ray follow-up knee replacement.  3 views RIGHT knee.  The implant is aligned normally. There is no loosening.  Impression normal appearing knee replacement.    Assessment: Knee replacement functioning well    Plan: One year follow

## 2011-12-20 ENCOUNTER — Ambulatory Visit (HOSPITAL_COMMUNITY)
Admission: RE | Admit: 2011-12-20 | Discharge: 2011-12-20 | Disposition: A | Discharge status: Home / Self Care | Payer: Medicare Other | Source: Ambulatory Visit | Attending: Family Medicine | Admitting: Family Medicine

## 2011-12-20 DIAGNOSIS — Z1231 Encounter for screening mammogram for malignant neoplasm of breast: Secondary | ICD-10-CM | POA: Insufficient documentation

## 2011-12-20 DIAGNOSIS — IMO0001 Reserved for inherently not codable concepts without codable children: Secondary | ICD-10-CM

## 2011-12-20 IMAGING — MG MM DIGITAL SCREENING BILAT W/ CAD
6 series · 6 of 6 positions shown · non-contrast
Comparison: Previous exams

CLINICAL DATA: Screening. With

DIGITAL SCREENING MAMMOGRAM WITH CAD

[L CC]
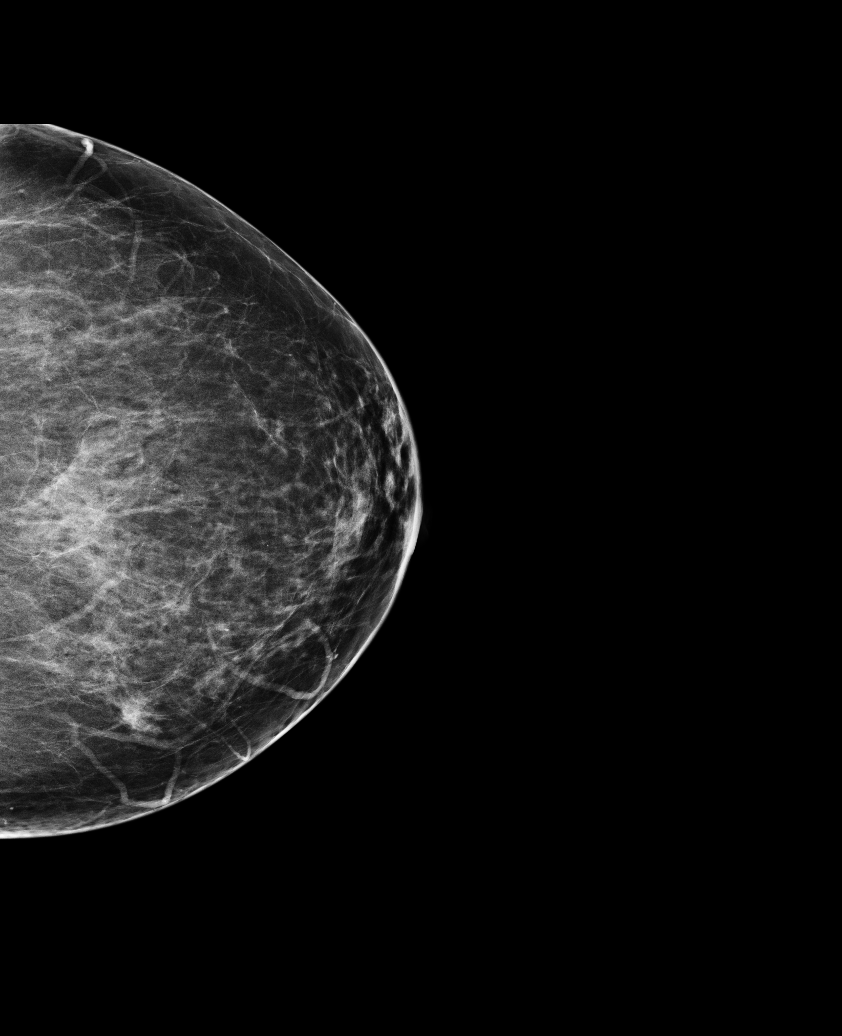

[L MLO (1 of 2)]
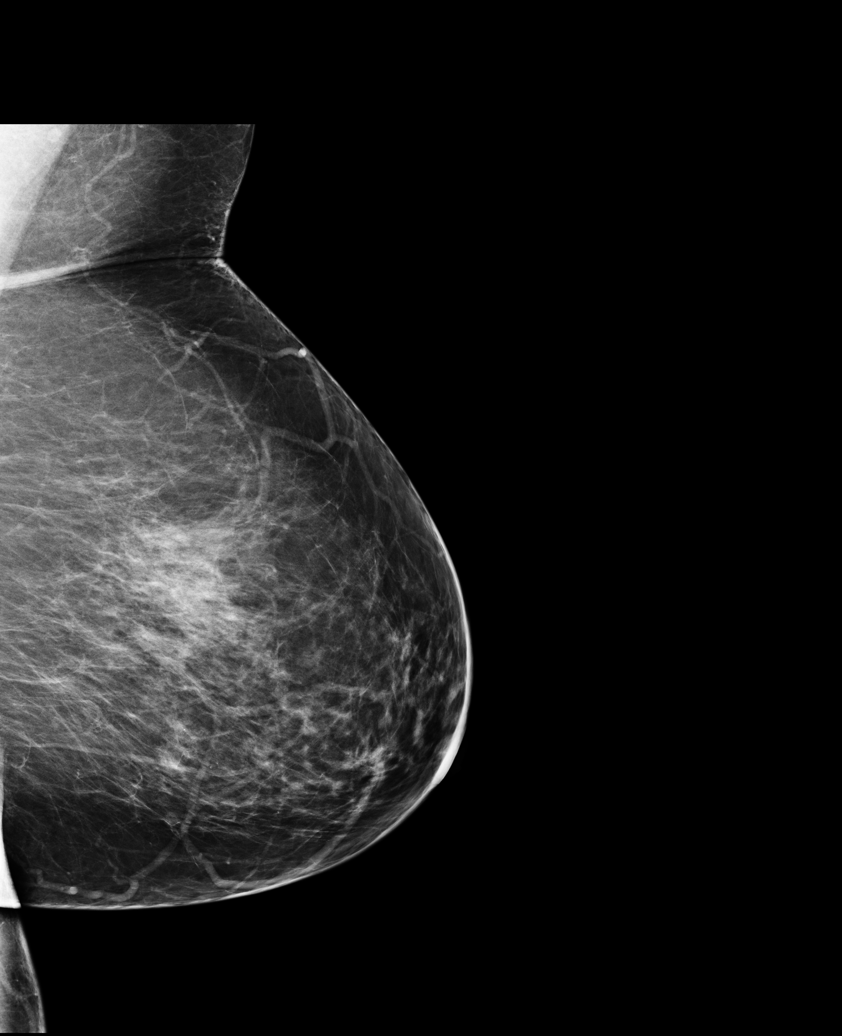

[R CC]
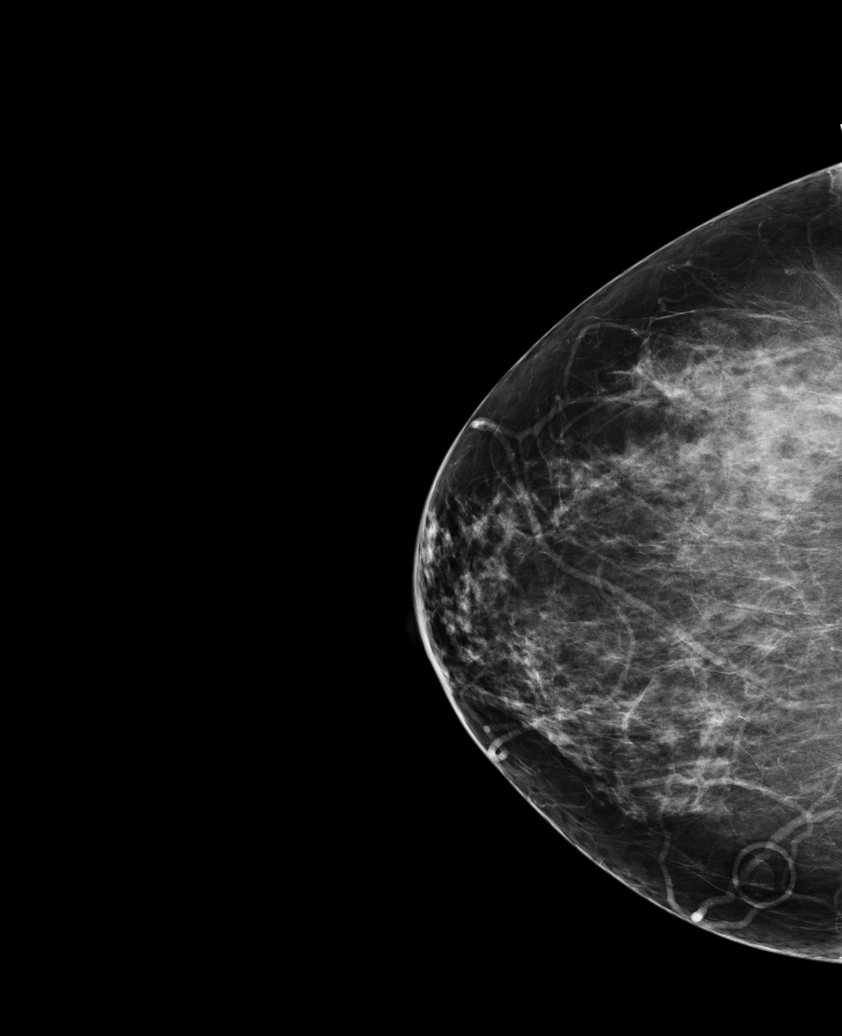

[R MLO (1 of 2)]
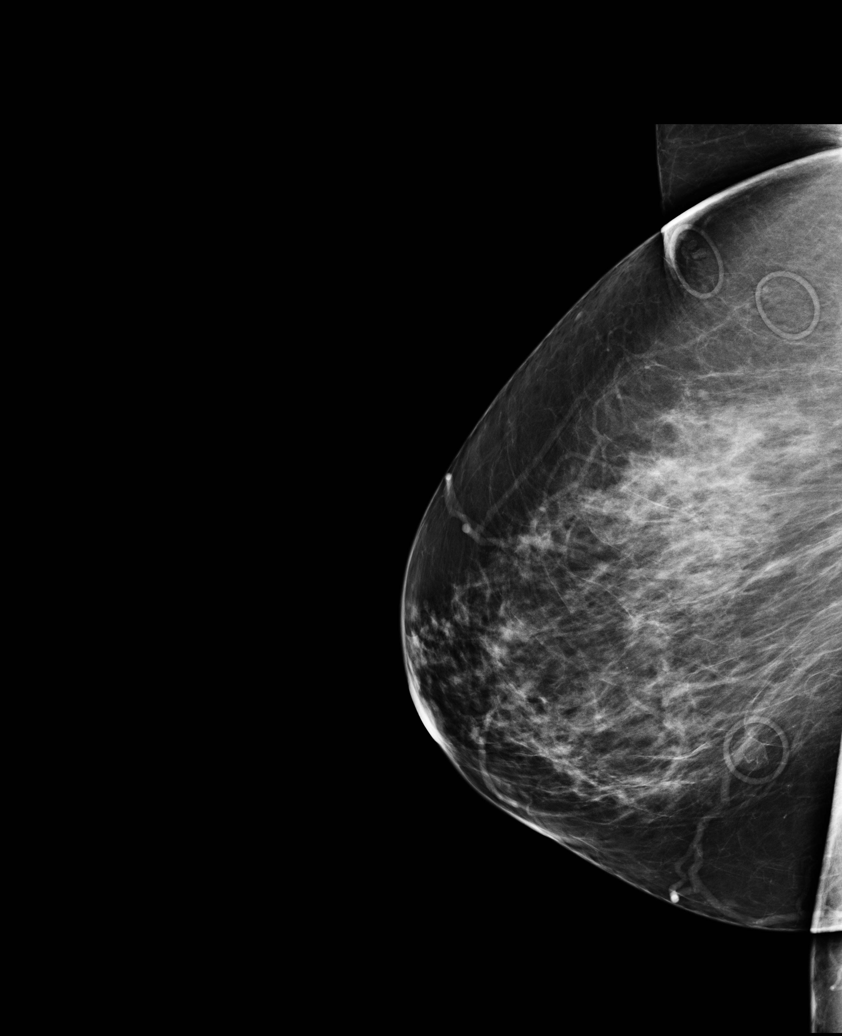

[L MLO (2 of 2)]
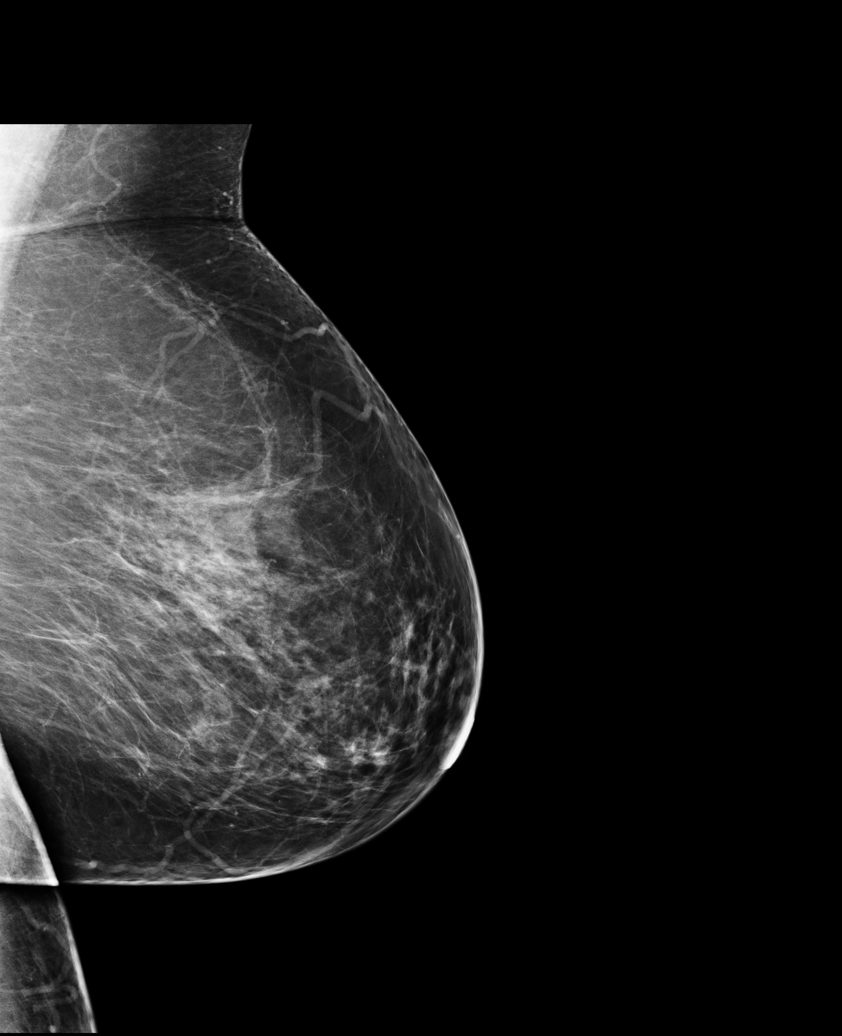

[R MLO (2 of 2)]
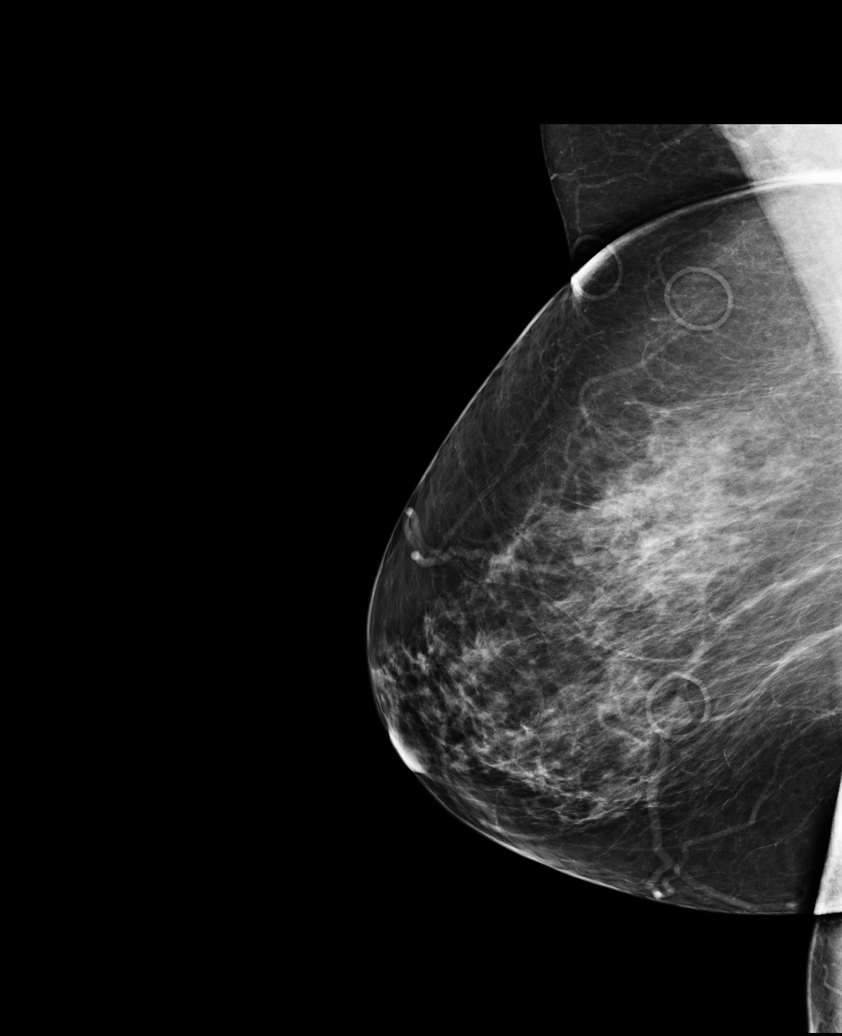

[6 of 6 positions shown; findings below may reference images not displayed]

FINDINGS: The breast tissue is heterogeneously dense. No
suspicious masses, architectural distortion, or calcifications are
present.

Images were processed with CAD.
IMPRESSION: No specific mammographic evidence of malignancy.

A result letter of this screening mammogram will be mailed directly
to the patient.

RECOMMENDATION:
Screening mammogram in one year. (Code:[S2])

BI-RADS CATEGORY 1:  Negative

## 2012-06-06 ENCOUNTER — Other Ambulatory Visit: Payer: Self-pay | Admitting: *Deleted

## 2012-06-06 DIAGNOSIS — E876 Hypokalemia: Secondary | ICD-10-CM

## 2012-06-06 MED ORDER — POTASSIUM CHLORIDE CRYS ER 20 MEQ PO TBCR: 20.0000 meq | EXTENDED_RELEASE_TABLET | Freq: Two times a day (BID) | ORAL | Status: DC

## 2012-06-28 ENCOUNTER — Ambulatory Visit (INDEPENDENT_AMBULATORY_CARE_PROVIDER_SITE_OTHER): Payer: Medicare Other | Admitting: Otolaryngology

## 2012-06-28 DIAGNOSIS — K219 Gastro-esophageal reflux disease without esophagitis: Secondary | ICD-10-CM

## 2012-06-28 DIAGNOSIS — R1312 Dysphagia, oropharyngeal phase: Secondary | ICD-10-CM

## 2012-12-12 ENCOUNTER — Ambulatory Visit: Payer: Medicare Other | Admitting: Orthopedic Surgery

## 2012-12-13 ENCOUNTER — Encounter: Payer: Self-pay | Admitting: Orthopedic Surgery

## 2012-12-13 ENCOUNTER — Ambulatory Visit (INDEPENDENT_AMBULATORY_CARE_PROVIDER_SITE_OTHER): Payer: Medicare Other

## 2012-12-13 ENCOUNTER — Ambulatory Visit (INDEPENDENT_AMBULATORY_CARE_PROVIDER_SITE_OTHER): Payer: Medicare Other | Admitting: Orthopedic Surgery

## 2012-12-13 VITALS — BP 120/60 | Ht 67.0 in | Wt 207.0 lb

## 2012-12-13 DIAGNOSIS — Z96651 Presence of right artificial knee joint: Secondary | ICD-10-CM

## 2012-12-13 DIAGNOSIS — Z96659 Presence of unspecified artificial knee joint: Secondary | ICD-10-CM

## 2012-12-13 NOTE — Progress Notes (Signed)
Patient ID: Leslie Barrera, female   DOB: 05-25-1941, 72 y.o.   MRN: [id number] Chief Complaint  Patient presents with  . Follow-up    Yearly recheck right TKA with XRAY DOS 12/08/09   BP 120/60  Ht 5\' 7"  (1.702 m)  Wt 207 lb (93.895 kg)  BMI 32.41 kg/m2 Encounter Diagnosis  Name Primary?  . S/P knee replacement, right Yes     History the patient had a right total knee arthroplasty complicated by right foot drop. She had preop risk factors of severe valgus deformity. Her foot drop was treated with a splint and has recovered nicely.  She does not have any knee pain or using a cane except on occasion when she has cramps in her right leg she denies any back pain on the review of systems  Vital signs are as recorded she is awake alert and oriented x3 her mood is pleasant her gait is normal her appearance is normal. Her extension is normal at the knee joint she has normal dorsiflexion plantar flexion of the right foot her knee flexion is 115  Her x-ray shows adequate alignment and no loosening  Recommend followup one year

## 2013-05-13 ENCOUNTER — Other Ambulatory Visit: Payer: Self-pay | Admitting: *Deleted

## 2013-05-13 DIAGNOSIS — E876 Hypokalemia: Secondary | ICD-10-CM

## 2013-05-13 MED ORDER — POTASSIUM CHLORIDE CRYS ER 20 MEQ PO TBCR: 20.0000 meq | EXTENDED_RELEASE_TABLET | Freq: Two times a day (BID) | ORAL | Status: DC

## 2013-06-14 ENCOUNTER — Other Ambulatory Visit (HOSPITAL_COMMUNITY): Payer: Self-pay | Admitting: Family Medicine

## 2013-06-14 DIAGNOSIS — Z139 Encounter for screening, unspecified: Secondary | ICD-10-CM

## 2013-06-17 ENCOUNTER — Ambulatory Visit (HOSPITAL_COMMUNITY)
Admission: RE | Admit: 2013-06-17 | Discharge: 2013-06-17 | Disposition: A | Discharge status: Home / Self Care | Payer: Medicare Other | Source: Ambulatory Visit | Attending: Family Medicine | Admitting: Family Medicine

## 2013-06-17 DIAGNOSIS — Z1231 Encounter for screening mammogram for malignant neoplasm of breast: Secondary | ICD-10-CM | POA: Insufficient documentation

## 2013-06-17 DIAGNOSIS — Z139 Encounter for screening, unspecified: Secondary | ICD-10-CM

## 2013-06-17 IMAGING — MG MM DIGITAL SCREENING BILAT W/ CAD
4 series · 4 of 4 positions shown · non-contrast
Comparison: Previous exam(s).

CLINICAL DATA: Screening.

EXAM:
DIGITAL SCREENING BILATERAL MAMMOGRAM WITH CAD

[L CC]
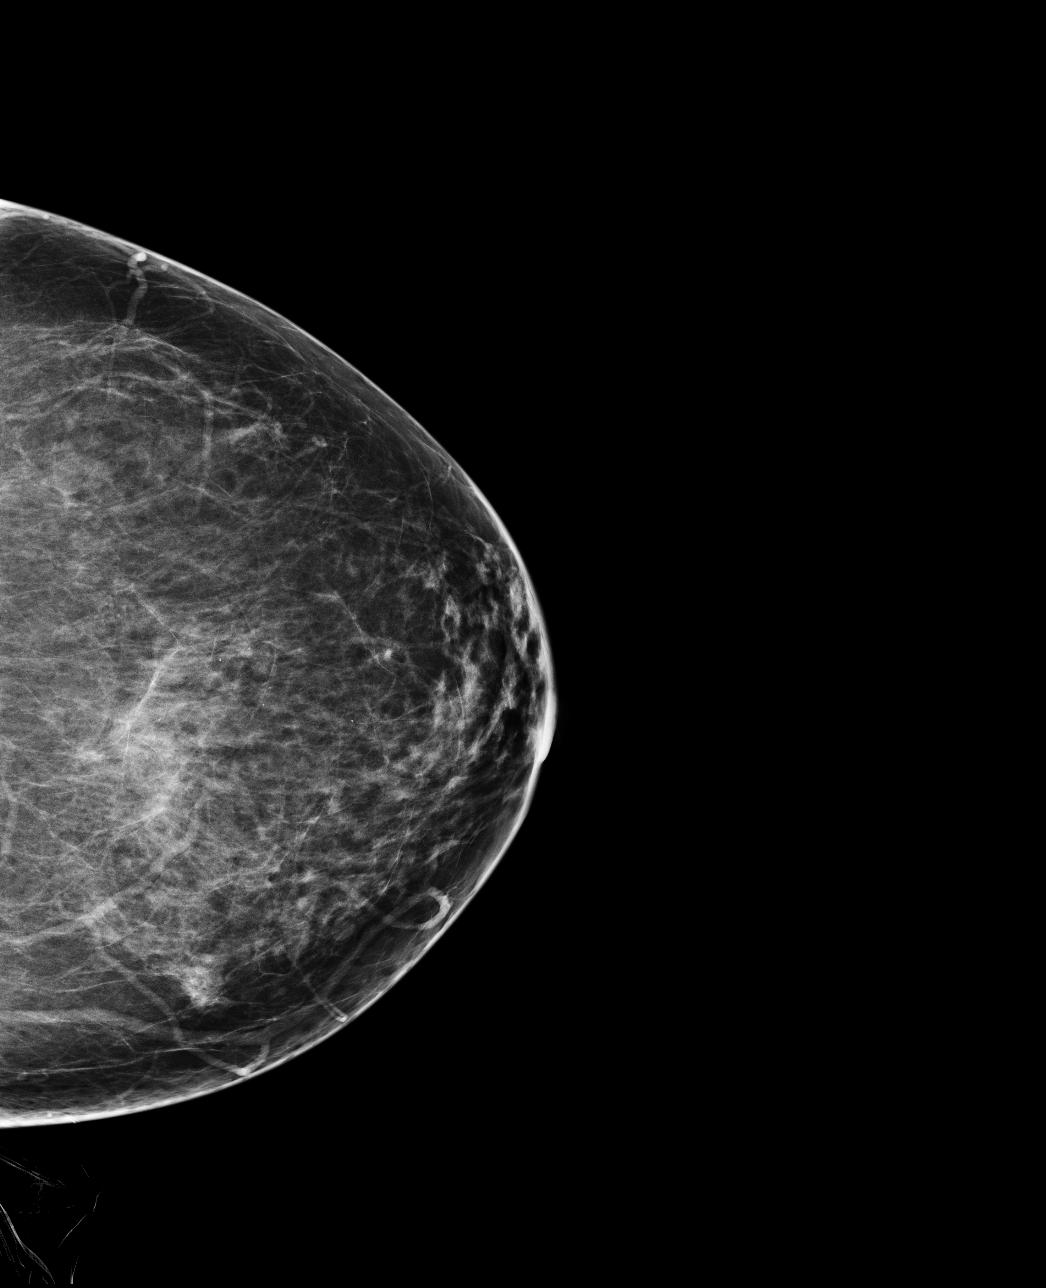

[L MLO]
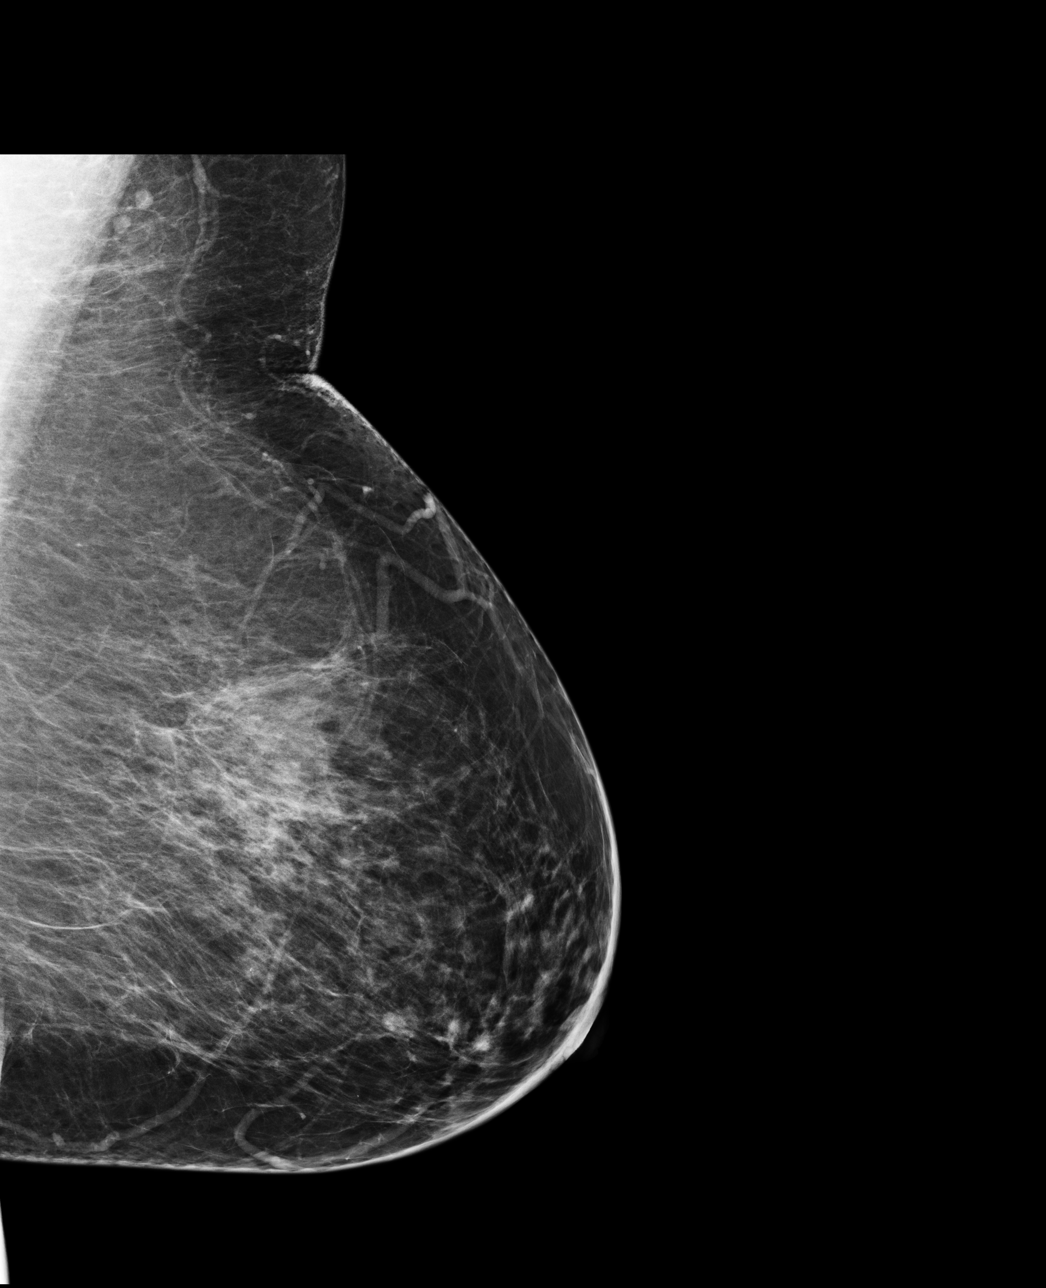

[R CC]
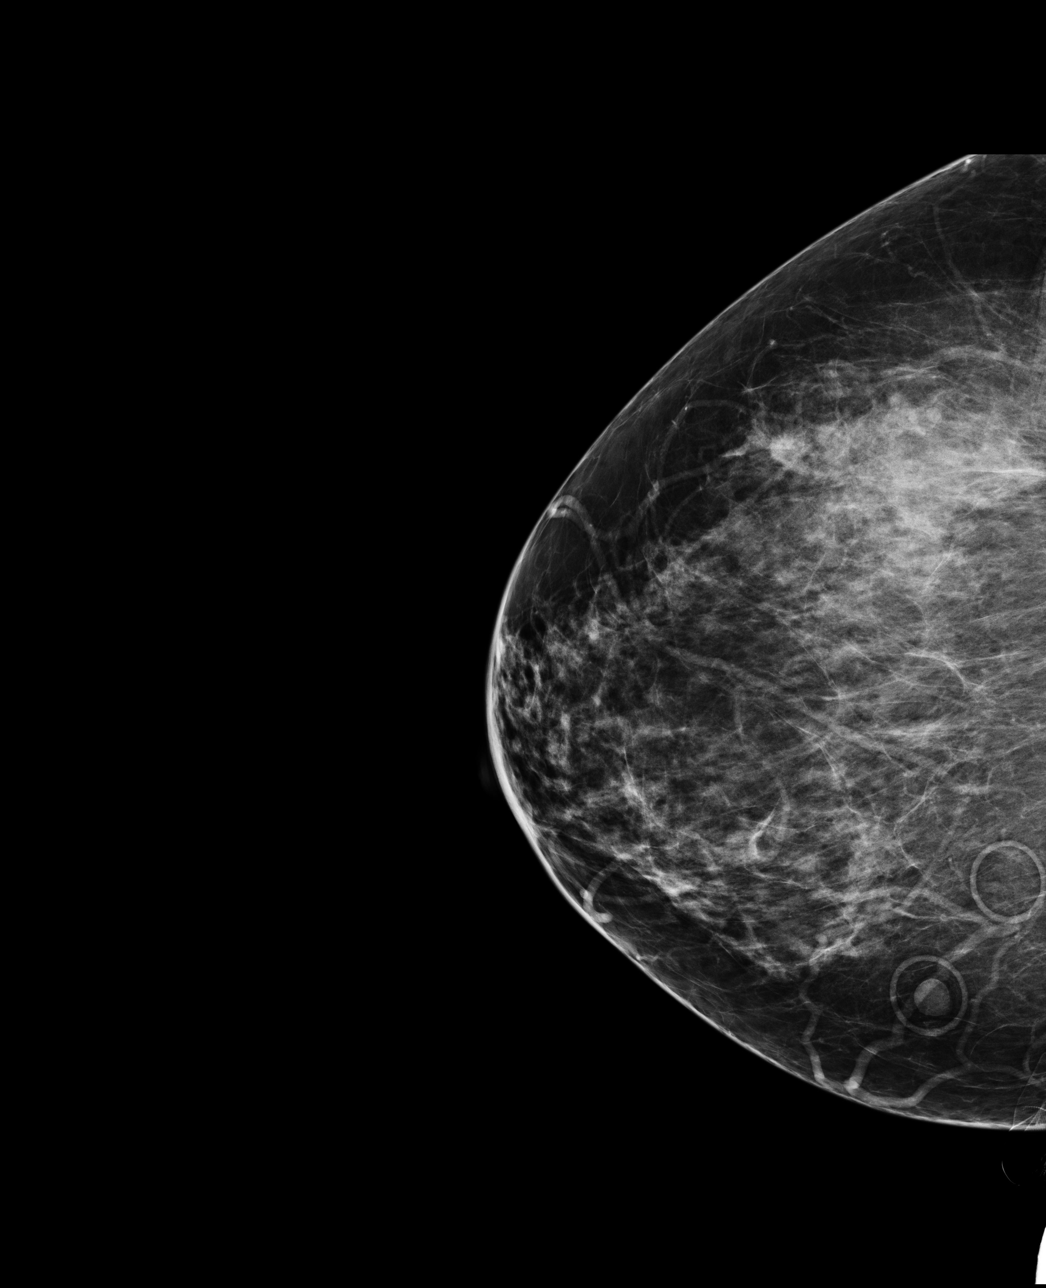

[R MLO]
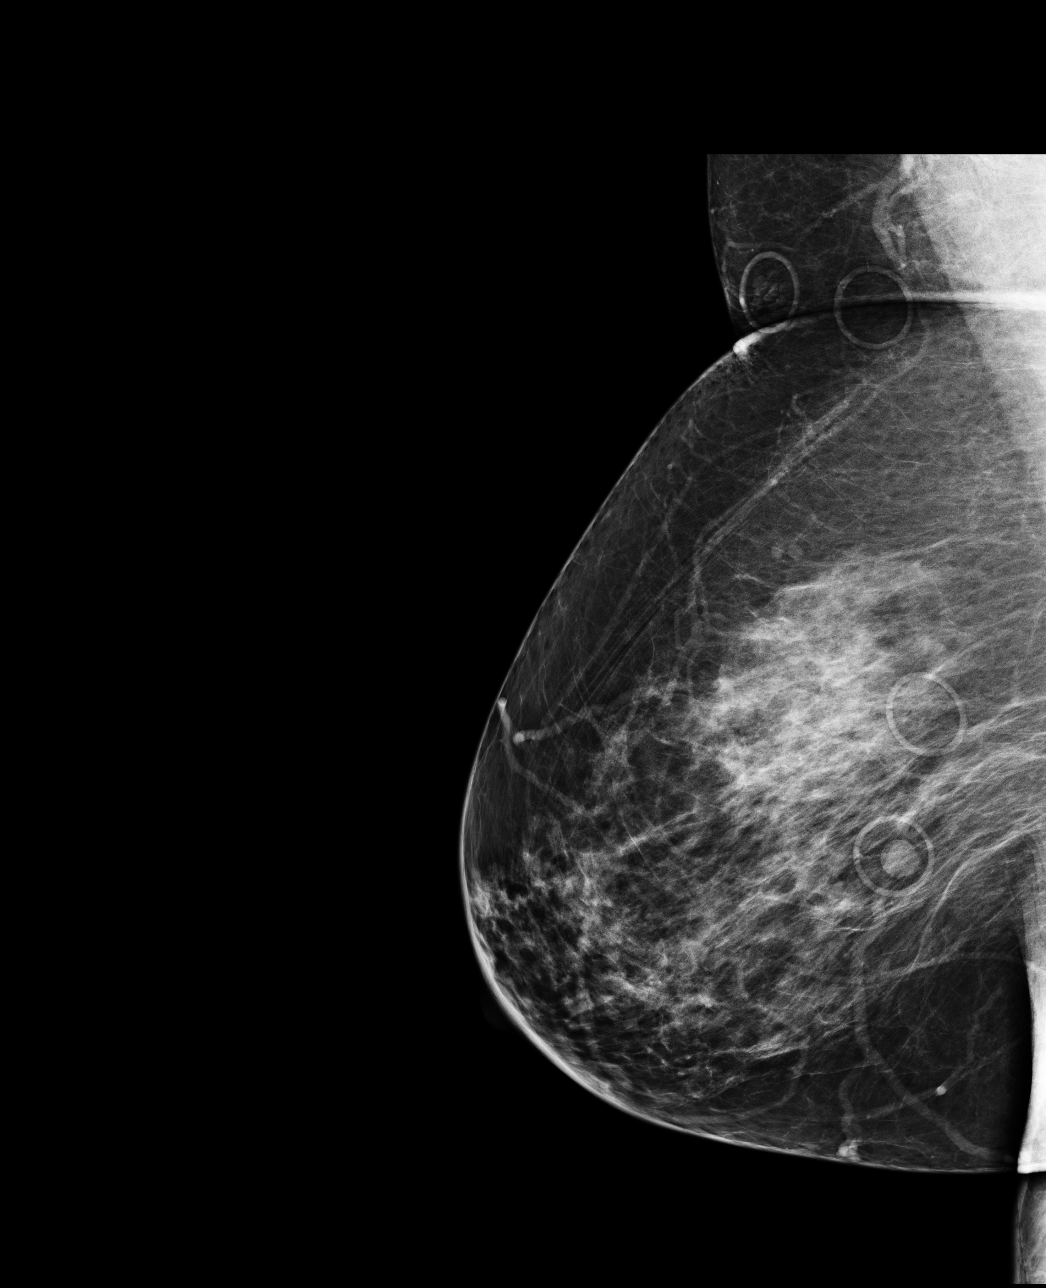

[4 of 4 positions shown; findings below may reference images not displayed]

ACR Breast Density Category c: The breasts are heterogeneously
dense, which may obscure small masses.
FINDINGS: There are no findings suspicious for malignancy. Images were
processed with CAD.
IMPRESSION: No mammographic evidence of malignancy. A result letter of this
screening mammogram will be mailed directly to the patient.

RECOMMENDATION:
Screening mammogram in one year. (Code:[M7])

BI-RADS CATEGORY  1: Negative

## 2013-07-11 ENCOUNTER — Other Ambulatory Visit (HOSPITAL_COMMUNITY): Payer: Self-pay | Admitting: Family Medicine

## 2013-07-11 ENCOUNTER — Ambulatory Visit (HOSPITAL_COMMUNITY)
Admission: RE | Admit: 2013-07-11 | Discharge: 2013-07-11 | Disposition: A | Discharge status: Home / Self Care | Payer: Medicare Other | Source: Ambulatory Visit | Attending: Family Medicine | Admitting: Family Medicine

## 2013-07-11 DIAGNOSIS — J069 Acute upper respiratory infection, unspecified: Secondary | ICD-10-CM | POA: Insufficient documentation

## 2013-07-11 IMAGING — CR DG CHEST 2V
2 series · 2 of 2 positions shown · non-contrast
Comparison: None.

CLINICAL DATA: Acute upper respiratory infections of other multiple
sites

EXAM:
CHEST  2 VIEW

[view not recorded (1 of 2)]
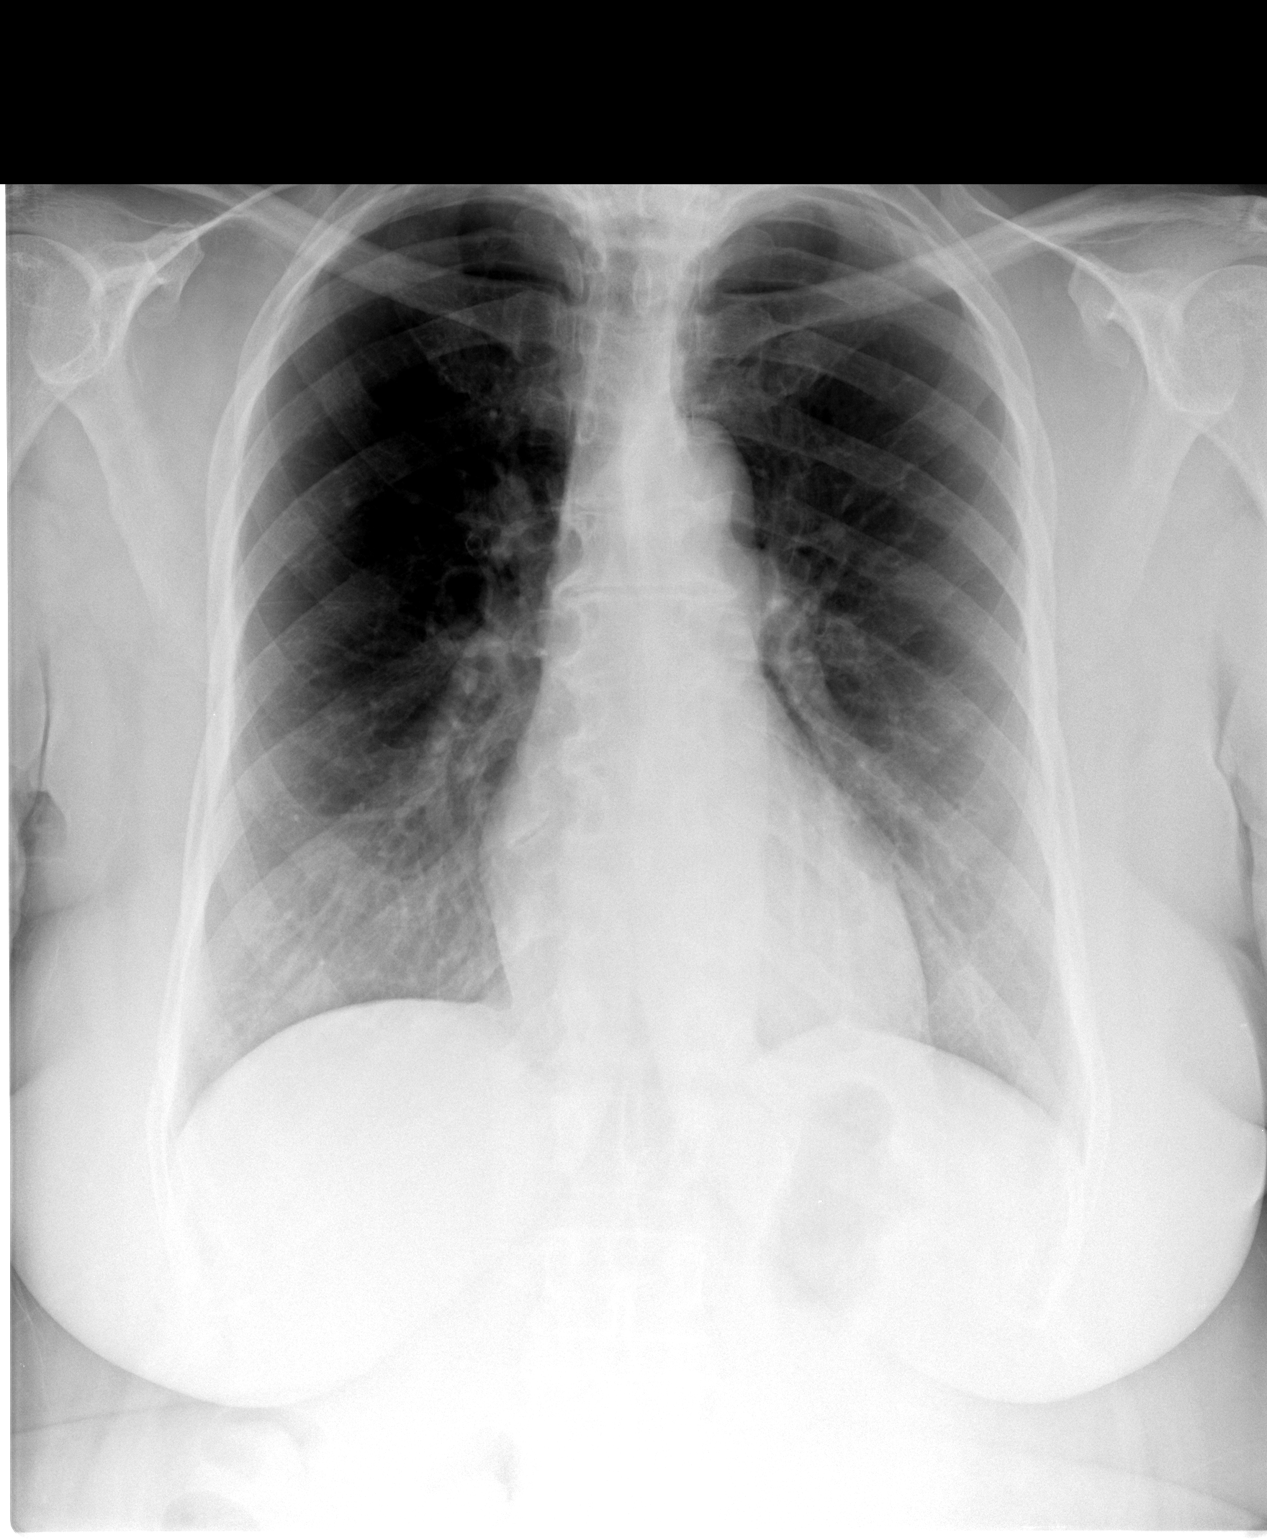

[view not recorded (2 of 2)]
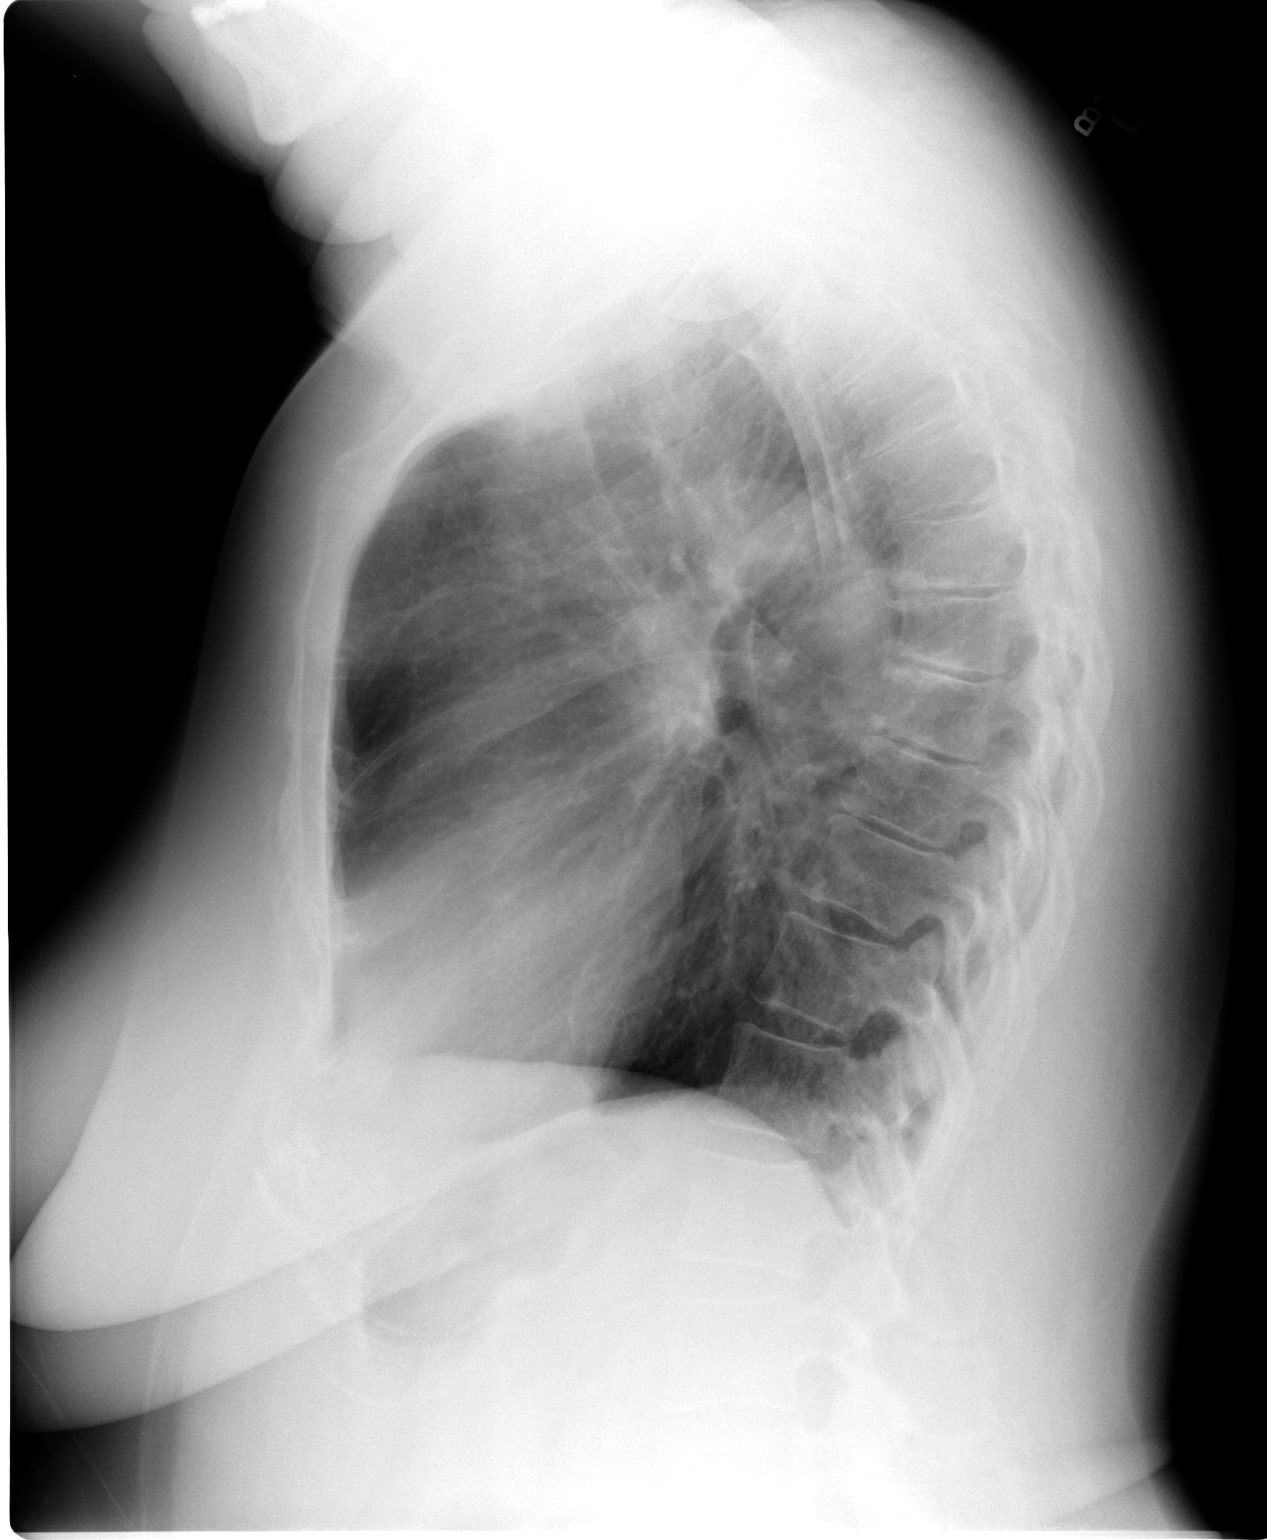

[2 of 2 positions shown; findings below may reference images not displayed]

FINDINGS: The heart size and mediastinal contours are within normal limits.
Both lungs are clear. The visualized skeletal structures are
unremarkable.
IMPRESSION: No active cardiopulmonary disease.

## 2013-12-12 ENCOUNTER — Ambulatory Visit: Payer: Medicare Other | Admitting: Orthopedic Surgery

## 2014-01-14 ENCOUNTER — Encounter: Payer: Self-pay | Admitting: Orthopedic Surgery

## 2014-01-14 ENCOUNTER — Ambulatory Visit (INDEPENDENT_AMBULATORY_CARE_PROVIDER_SITE_OTHER): Payer: Medicare Other | Admitting: Orthopedic Surgery

## 2014-01-14 ENCOUNTER — Ambulatory Visit (INDEPENDENT_AMBULATORY_CARE_PROVIDER_SITE_OTHER): Payer: Medicare Other

## 2014-01-14 VITALS — BP 122/62 | Ht 67.0 in | Wt 202.0 lb

## 2014-01-14 DIAGNOSIS — Z96659 Presence of unspecified artificial knee joint: Secondary | ICD-10-CM

## 2014-01-14 DIAGNOSIS — IMO0002 Reserved for concepts with insufficient information to code with codable children: Secondary | ICD-10-CM

## 2014-01-14 DIAGNOSIS — M1710 Unilateral primary osteoarthritis, unspecified knee: Secondary | ICD-10-CM

## 2014-01-14 DIAGNOSIS — Z96651 Presence of right artificial knee joint: Secondary | ICD-10-CM

## 2014-01-14 NOTE — Progress Notes (Signed)
Patient ID: Leslie Barrera, female   DOB: July 08, 1940, 73 y.o.   MRN: [id number]  History this is the  4th  Yr annual followup status post right  knee replacement  The patient is not having any pain in the knee The patient's function with normal activities is excellent   Review of systems musculoskeletal the patient denies catching locking or giving way in the knee    General appearance is normal, the patient is alert and oriented x3 with normal mood and affect. Knee flexion is  110 The knee is stable in the anterior posterior and medial lateral plane There is no tenderness or swelling  Status post right  total knee  A followup x-ray will be performed at one year from now

## 2014-01-14 NOTE — Patient Instructions (Signed)
Activity as tolerated

## 2014-08-15 ENCOUNTER — Other Ambulatory Visit (HOSPITAL_COMMUNITY): Payer: Self-pay | Admitting: Family Medicine

## 2014-08-15 DIAGNOSIS — Z1231 Encounter for screening mammogram for malignant neoplasm of breast: Secondary | ICD-10-CM

## 2014-08-21 ENCOUNTER — Ambulatory Visit (HOSPITAL_COMMUNITY)
Admission: RE | Admit: 2014-08-21 | Discharge: 2014-08-21 | Disposition: A | Discharge status: Home / Self Care | Payer: Medicare Other | Source: Ambulatory Visit | Attending: Family Medicine | Admitting: Family Medicine

## 2014-08-21 DIAGNOSIS — Z1231 Encounter for screening mammogram for malignant neoplasm of breast: Secondary | ICD-10-CM | POA: Insufficient documentation

## 2014-09-09 DIAGNOSIS — Z6835 Body mass index (BMI) 35.0-35.9, adult: Secondary | ICD-10-CM | POA: Diagnosis not present

## 2014-09-09 DIAGNOSIS — R7301 Impaired fasting glucose: Secondary | ICD-10-CM | POA: Diagnosis not present

## 2014-09-09 DIAGNOSIS — E6609 Other obesity due to excess calories: Secondary | ICD-10-CM | POA: Diagnosis not present

## 2014-09-09 DIAGNOSIS — E782 Mixed hyperlipidemia: Secondary | ICD-10-CM | POA: Diagnosis not present

## 2014-09-09 DIAGNOSIS — I10 Essential (primary) hypertension: Secondary | ICD-10-CM | POA: Diagnosis not present

## 2014-10-24 DIAGNOSIS — Z6835 Body mass index (BMI) 35.0-35.9, adult: Secondary | ICD-10-CM | POA: Diagnosis not present

## 2014-10-24 DIAGNOSIS — B029 Zoster without complications: Secondary | ICD-10-CM | POA: Diagnosis not present

## 2014-10-24 DIAGNOSIS — E6609 Other obesity due to excess calories: Secondary | ICD-10-CM | POA: Diagnosis not present

## 2014-11-14 DIAGNOSIS — Z6835 Body mass index (BMI) 35.0-35.9, adult: Secondary | ICD-10-CM | POA: Diagnosis not present

## 2014-11-14 DIAGNOSIS — L52 Erythema nodosum: Secondary | ICD-10-CM | POA: Diagnosis not present

## 2014-11-14 DIAGNOSIS — E6609 Other obesity due to excess calories: Secondary | ICD-10-CM | POA: Diagnosis not present

## 2014-12-24 ENCOUNTER — Other Ambulatory Visit (HOSPITAL_COMMUNITY): Payer: Self-pay | Admitting: Family Medicine

## 2014-12-24 ENCOUNTER — Ambulatory Visit (HOSPITAL_COMMUNITY)
Admission: RE | Admit: 2014-12-24 | Discharge: 2014-12-24 | Disposition: A | Discharge status: Home / Self Care | Payer: Medicare Other | Source: Ambulatory Visit | Attending: Family Medicine | Admitting: Family Medicine

## 2014-12-24 DIAGNOSIS — M25531 Pain in right wrist: Secondary | ICD-10-CM | POA: Diagnosis not present

## 2014-12-24 DIAGNOSIS — Z1389 Encounter for screening for other disorder: Secondary | ICD-10-CM | POA: Diagnosis not present

## 2014-12-24 DIAGNOSIS — M19031 Primary osteoarthritis, right wrist: Secondary | ICD-10-CM | POA: Diagnosis not present

## 2014-12-24 DIAGNOSIS — M25431 Effusion, right wrist: Secondary | ICD-10-CM | POA: Diagnosis not present

## 2014-12-24 DIAGNOSIS — E6609 Other obesity due to excess calories: Secondary | ICD-10-CM | POA: Diagnosis not present

## 2014-12-24 DIAGNOSIS — Z6834 Body mass index (BMI) 34.0-34.9, adult: Secondary | ICD-10-CM | POA: Diagnosis not present

## 2014-12-24 IMAGING — CR DG WRIST COMPLETE 3+V*R*
4 series · 4 of 4 positions shown · non-contrast
Comparison: None.

CLINICAL DATA: Right posterior and lateral wrist pain extending
into the hand and fingers, no known injury, onset of symptoms
yesterday.

EXAM:
RIGHT WRIST - COMPLETE 3+ VIEW

[view not recorded (1 of 4)]
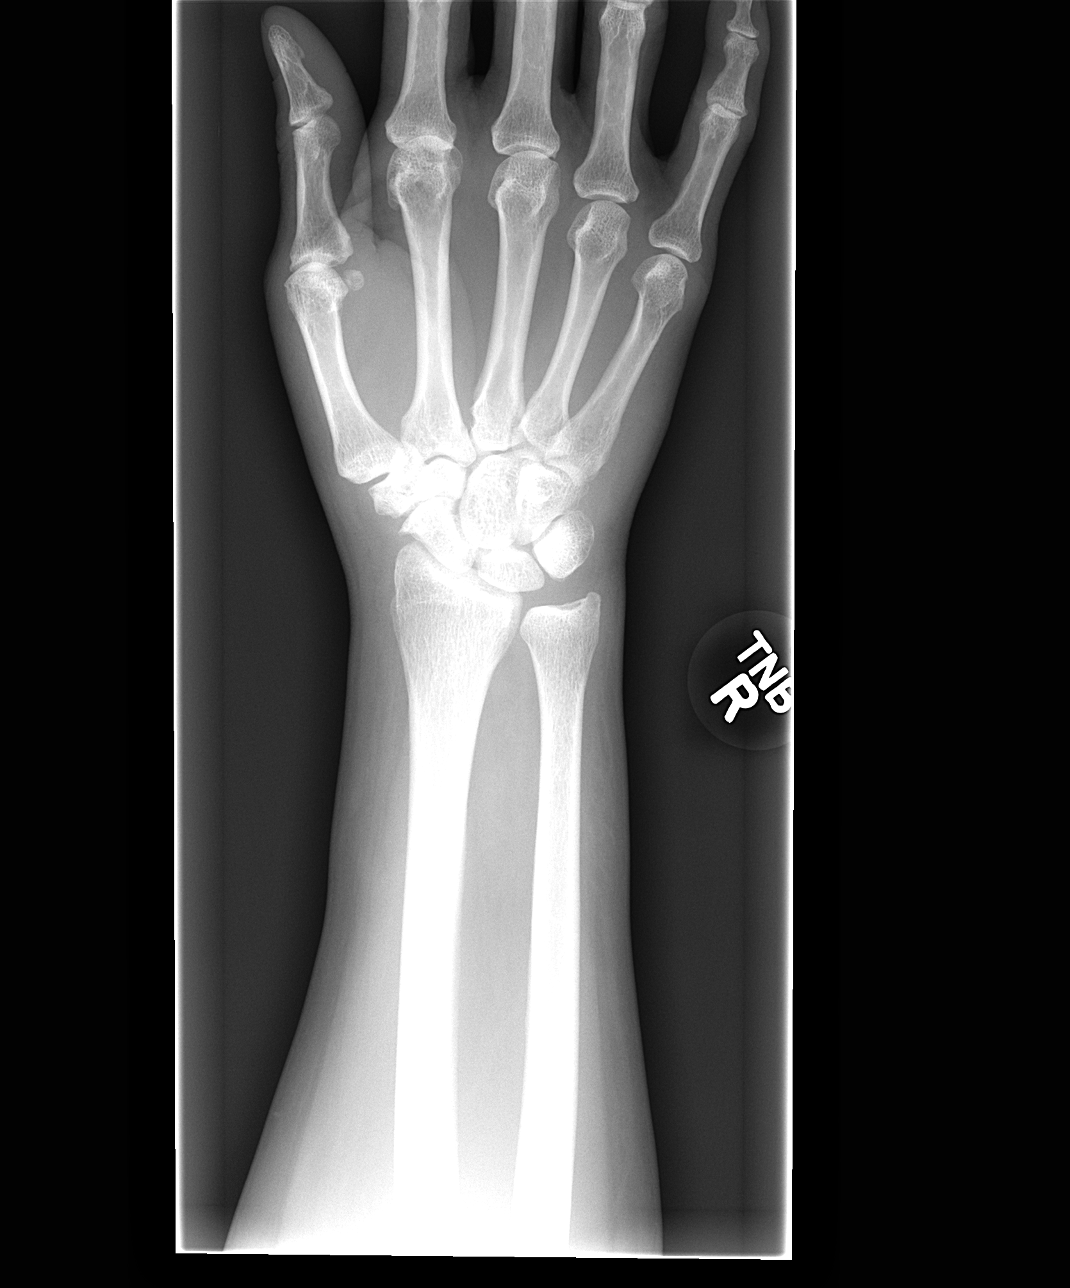

[view not recorded (2 of 4)]
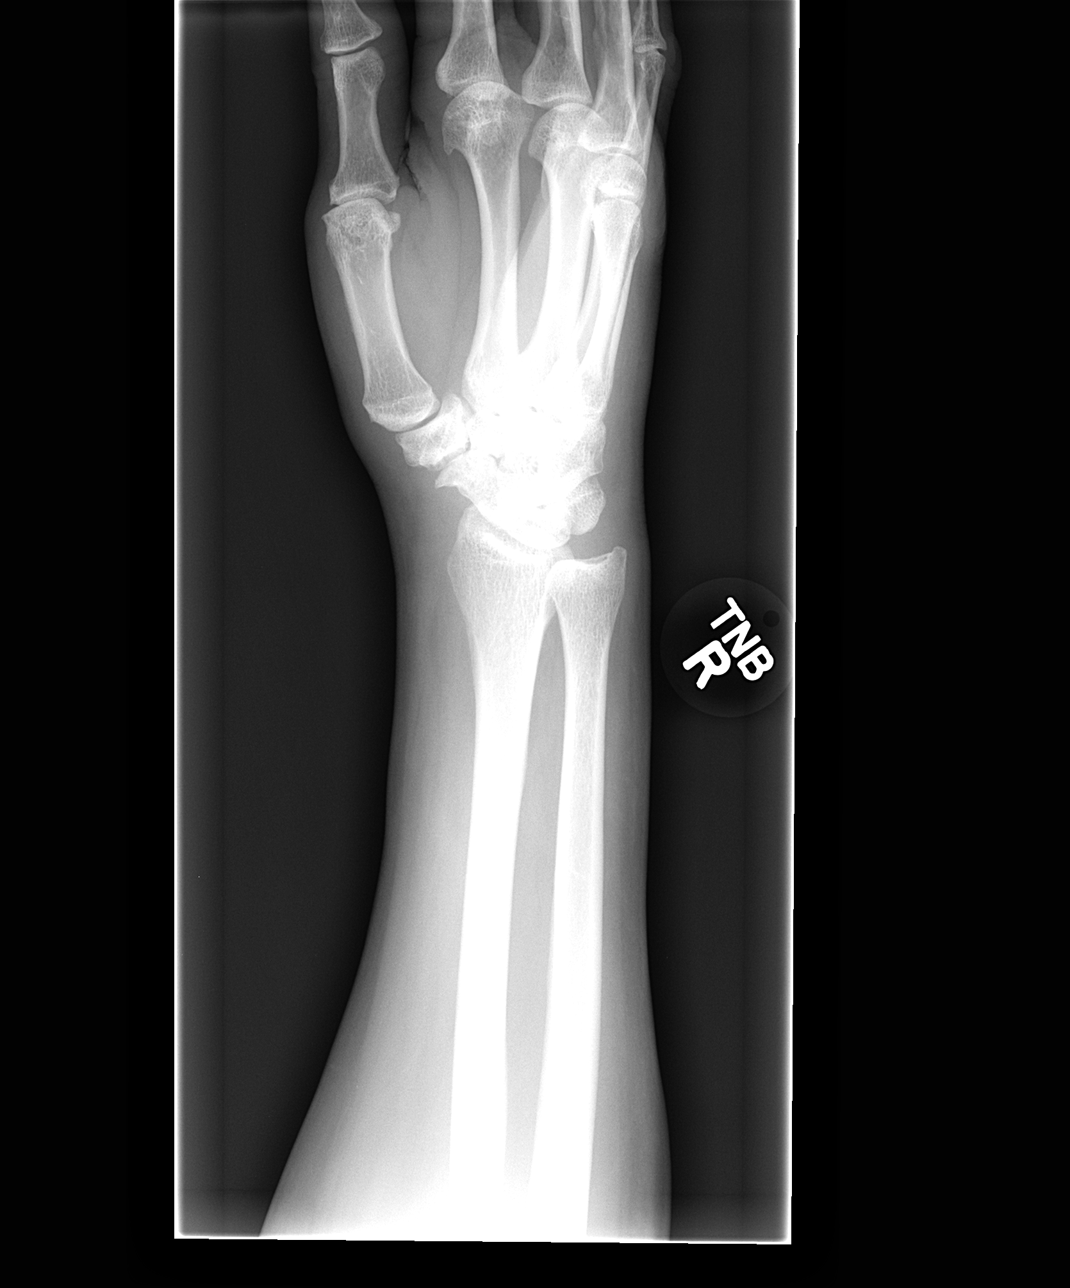

[view not recorded (3 of 4)]
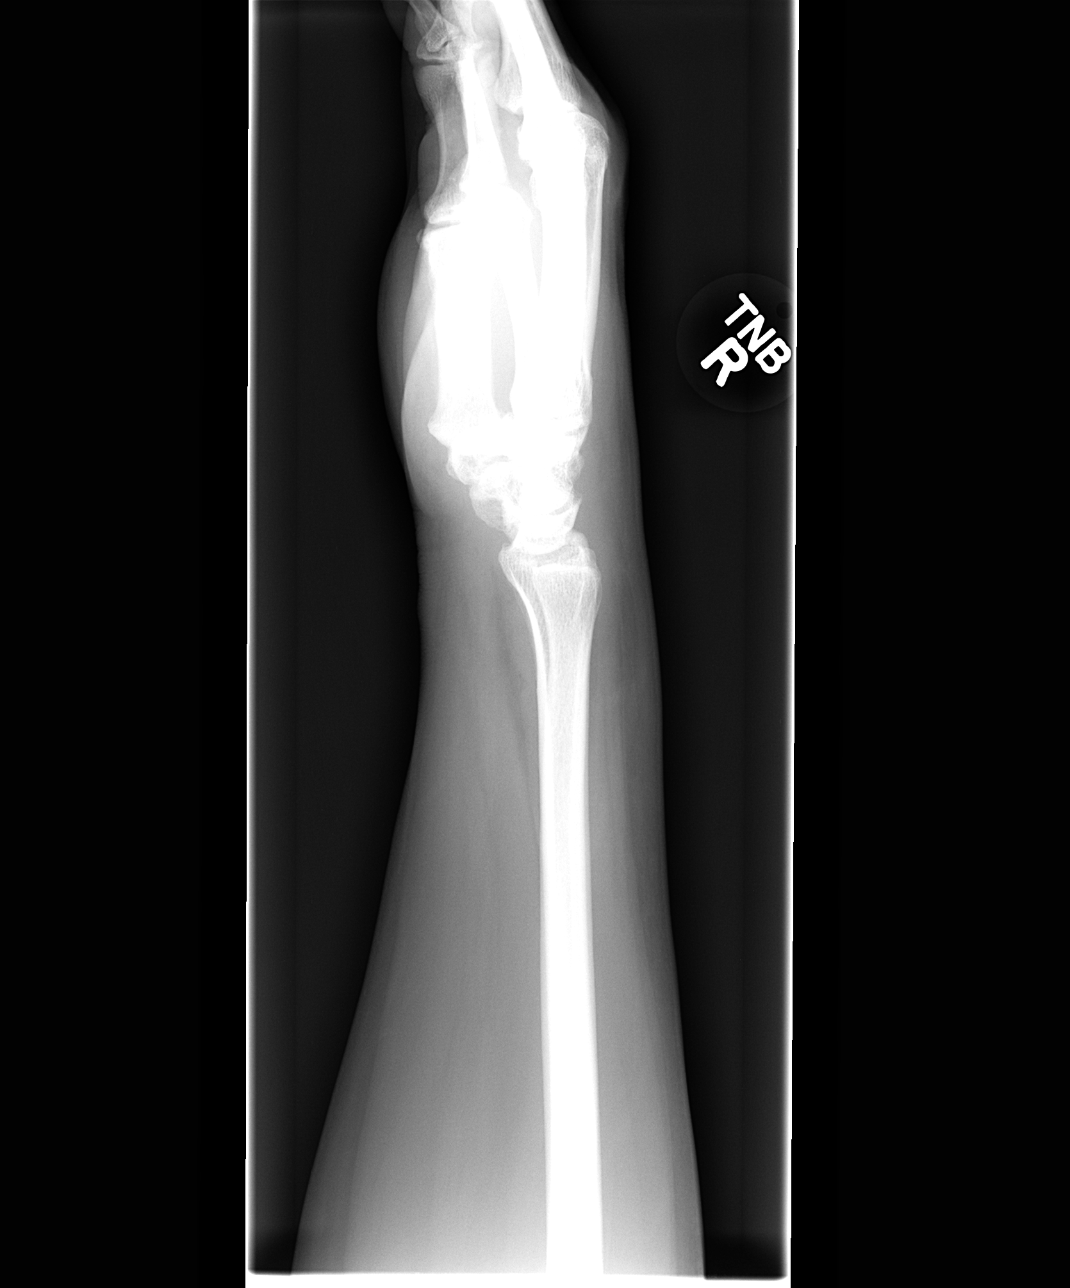

[view not recorded (4 of 4)]
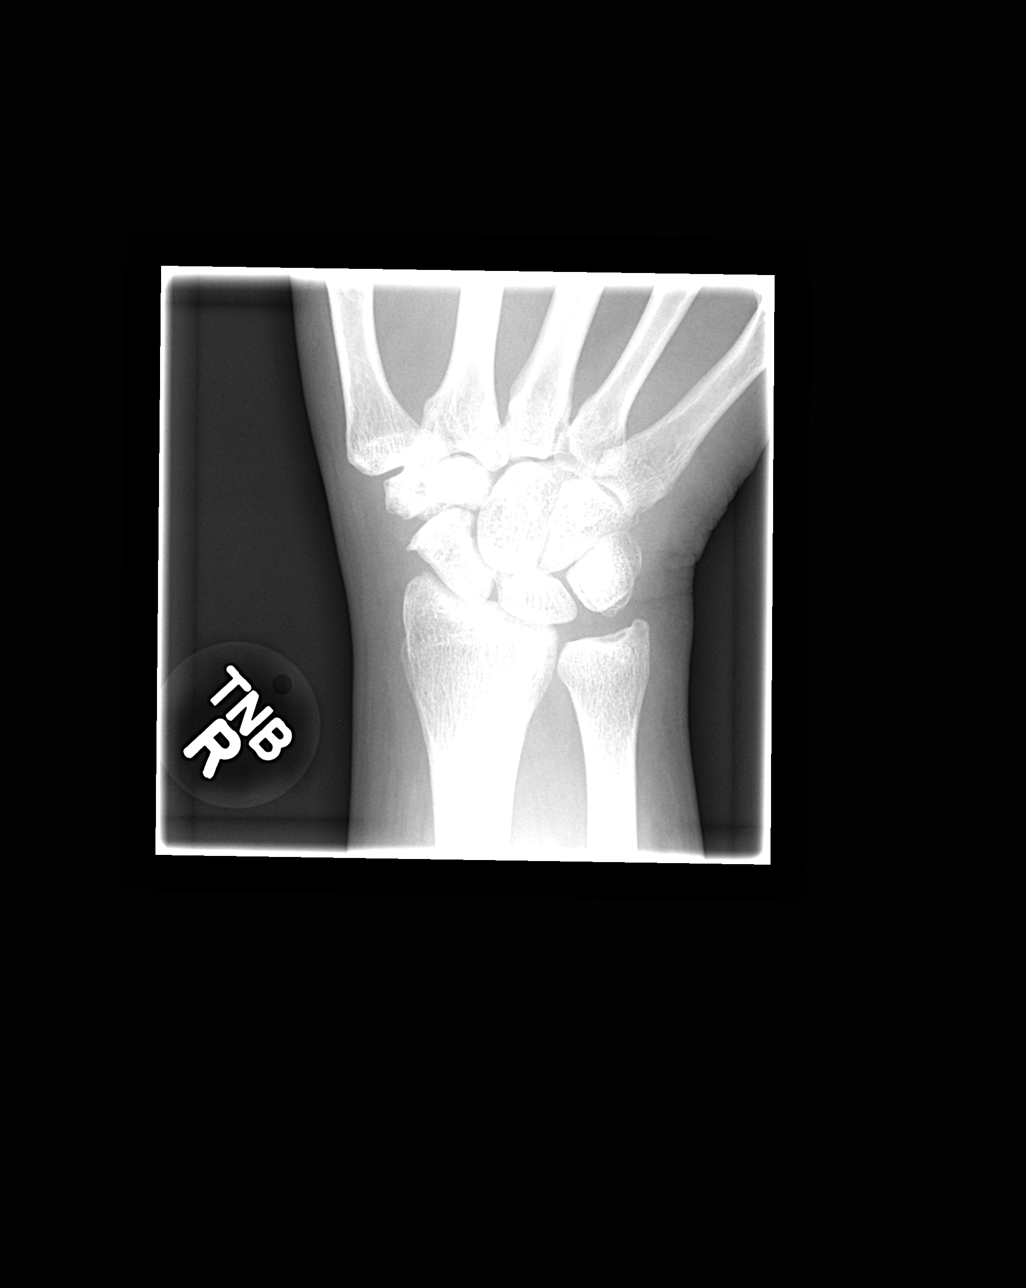

[4 of 4 positions shown; findings below may reference images not displayed]

FINDINGS: The bones of the wrist are adequately mineralized. There are mild
degenerative changes of the intercarpal joints centered along the
distal aspect of the scaphoid as it articulates with the trapezium
and trapezoid. The radiocarpal and ulnocarpal joints are normal. The
carpometacarpal joints are normal for age. There is mild
degenerative change of the first metacarpophalangeal joint. The soft
tissues of the wrist are unremarkable.
IMPRESSION: There are mild intercarpal degenerative changes as well as
degenerative changes of the first metacarpophalangeal joint. There
is no acute bony abnormality.

## 2015-01-13 ENCOUNTER — Ambulatory Visit (INDEPENDENT_AMBULATORY_CARE_PROVIDER_SITE_OTHER): Payer: Medicare Other | Admitting: Orthopedic Surgery

## 2015-01-13 ENCOUNTER — Encounter: Payer: Self-pay | Admitting: Orthopedic Surgery

## 2015-01-13 ENCOUNTER — Ambulatory Visit (INDEPENDENT_AMBULATORY_CARE_PROVIDER_SITE_OTHER): Payer: Medicare Other

## 2015-01-13 VITALS — BP 119/59 | Ht 67.0 in | Wt 202.0 lb

## 2015-01-13 DIAGNOSIS — M129 Arthropathy, unspecified: Secondary | ICD-10-CM | POA: Diagnosis not present

## 2015-01-13 DIAGNOSIS — M1711 Unilateral primary osteoarthritis, right knee: Secondary | ICD-10-CM

## 2015-01-13 DIAGNOSIS — Z96651 Presence of right artificial knee joint: Secondary | ICD-10-CM | POA: Diagnosis not present

## 2015-01-13 NOTE — Progress Notes (Signed)
Patient ID: Leslie Barrera, female   DOB: 23-Feb-1941, 74 y.o.   MRN: [id number] Patient ID: Leslie Barrera, female   DOB: 01/19/1941, 74 y.o.   MRN: [id number]  Post op annual TKA   Chief Complaint  Patient presents with  . Follow-up    Yearly recheck on right knee replacement with xray, DOS 12-08-09.    HPI Leslie Barrera is a 74 y.o. female.  S/p rt tka, 5years  The patient has a well-functioning knee at 5 years without any problems. She did have a postop foot drop from the valgus knee correction which completely resolved with the AFO now she needs no assistive devices for the foot drop and she is having no numbness or tingling or residual.  Review of systems is otherwise unremarkable   Past Medical History  Diagnosis Date  . HTN (hypertension)   . High cholesterol     Past Surgical History  Procedure Laterality Date  . Vesicovaginal fistula closure w/ tah    . Small toe surgery    . Left-great toe bunion    . Left knee arthroscopy  2006    [facility]     Allergies  Allergen Reactions  . Tetracycline     Current Outpatient Prescriptions  Medication Sig Dispense Refill  . aspirin EC 81 MG tablet Take 81 mg by mouth daily.      Marland Kitchen azithromycin (ZITHROMAX) 250 MG tablet     . bisoprolol-hydrochlorothiazide (ZIAC) 10-6.25 MG per tablet     . calcium-vitamin D (OSCAL WITH D) 500-200 MG-UNIT per tablet Take 1 tablet by mouth daily.      . furosemide (LASIX) 40 MG tablet     . neomycin-polymyxin b-dexamethasone (MAXITROL) 3.5-10000-0.1 SUSP     . potassium chloride SA (K-DUR,KLOR-CON) 20 MEQ tablet Take 1 tablet (20 mEq total) by mouth 2 (two) times daily. 60 tablet 5  . simvastatin (ZOCOR) 40 MG tablet Take 40 mg by mouth at bedtime.      . valACYclovir (VALTREX) 1000 MG tablet      No current facility-administered medications for this visit.    Review of Systems Review of Systems   Physical Exam Blood pressure 119/59, height 5\' 7"  (1.702 m), weight 202 lb  (91.627 kg).   Gen. appearance is normal there are no congenital abnormalities   The patient is oriented 3   Mood and affect are normal   Ambulation is without assistive device   Right Knee inspection reveals a well-healed incision with no swelling   Knee flexion 115  Stability in the anteroposterior plane is normal as well as in the medial lateral plane  Motor exam reveals full extension without extensor lag   Data Reviewed KNEE XRAYS : Ordered and read as normal total knee at 5 years  Assessment S/P TKA - right   Plan    Return in a year for postop knee film       Arther Abbott 01/13/2015, 9:39 AM

## 2015-01-15 ENCOUNTER — Ambulatory Visit: Payer: Medicare Other | Admitting: Orthopedic Surgery

## 2015-01-15 DIAGNOSIS — H524 Presbyopia: Secondary | ICD-10-CM | POA: Diagnosis not present

## 2015-01-15 DIAGNOSIS — H5203 Hypermetropia, bilateral: Secondary | ICD-10-CM | POA: Diagnosis not present

## 2015-01-15 DIAGNOSIS — H52223 Regular astigmatism, bilateral: Secondary | ICD-10-CM | POA: Diagnosis not present

## 2015-01-15 DIAGNOSIS — H25819 Combined forms of age-related cataract, unspecified eye: Secondary | ICD-10-CM | POA: Diagnosis not present

## 2015-01-16 DIAGNOSIS — Z0000 Encounter for general adult medical examination without abnormal findings: Secondary | ICD-10-CM | POA: Diagnosis not present

## 2015-01-16 DIAGNOSIS — Z6835 Body mass index (BMI) 35.0-35.9, adult: Secondary | ICD-10-CM | POA: Diagnosis not present

## 2015-01-16 DIAGNOSIS — E6609 Other obesity due to excess calories: Secondary | ICD-10-CM | POA: Diagnosis not present

## 2015-01-16 DIAGNOSIS — Z1389 Encounter for screening for other disorder: Secondary | ICD-10-CM | POA: Diagnosis not present

## 2015-02-24 DIAGNOSIS — Z1211 Encounter for screening for malignant neoplasm of colon: Secondary | ICD-10-CM | POA: Diagnosis not present

## 2015-03-10 NOTE — H&P (Signed)
  NTS SOAP Note  Vital Signs:  Vitals as of: 12/24/4763: Systolic 465: Diastolic 74: Heart Rate 63: Temp 20F: Height 56ft 5in: Weight 209Lbs 0 Ounces: BMI 34.78  BMI : 34.78 kg/m2  Subjective: This 74 year old female presents for of need for screening TCS.  Last had a TCS over ten years ago.  No family h/o colon carcinoma.  Denies any lower gi complaints.  Review of Symptoms:  Constitutional:unremarkable   Head:unremarkable Eyes:unremarkable   sinus Cardiovascular:  unremarkable Respiratory:unremarkable Gastrointestinal:  unremarkable   Genitourinary:unremarkable   joint pain Skin:unremarkable Hematolgic/Lymphatic:unremarkable   Allergic/Immunologic:unremarkable   Past Medical History:  Reviewed  Past Medical History  Surgical History: knee replacement, foot surgeries Medical Problems: HTN, high cholesterol Allergies: tetracycline Medications: kdur, bisoprolol/HCTZ, lasix, simvastatin, norco   Social History:Reviewed  Social History  Preferred Language: English Race:  Black or African American Ethnicity: Not Hispanic / Latino Age: 1 year Marital Status:  M Alcohol: no   Smoking Status: Never smoker reviewed on 02/24/2015 Functional Status reviewed on 02/24/2015 ------------------------------------------------ Bathing: Normal Cooking: Normal Dressing: Normal Driving: Normal Eating: Normal Managing Meds: Normal Oral Care: Normal Shopping: Normal Toileting: Normal Transferring: Normal Walking: Normal Cognitive Status reviewed on 02/24/2015 ------------------------------------------------ Attention: Normal Decision Making: Normal Language: Normal Memory: Normal Motor: Normal Perception: Normal Problem Solving: Normal Visual and Spatial: Normal   Family History:Reviewed  Family Health History Mother, Deceased; Healthy;  Father, Deceased; Healthy;     Objective Information: General:Well appearing, well nourished in no  distress. Heart:RRR, no murmur Lungs:  CTA bilaterally, no wheezes, rhonchi, rales.  Breathing unlabored. Abdomen:Soft, NT/ND, no HSM, no masses. deferred to procedure  Assessment:need for screening TCS  Diagnoses: V76.51  Z12.11 Screening for malignant neoplasm of colon (Encounter for screening for malignant neoplasm of colon)  Procedures: 03546 - OFFICE OUTPATIENT NEW 20 MINUTES    Plan:  Scheduled for TCS on 04/07/15.   Patient Education:Alternative treatments to surgery were discussed with patient (and family).  Risks and benefits  of procedure incluidng bleeding and perforation were fully explained to the patient (and family) who gave informed consent. Patient/family questions were addressed.  Follow-up:Pending Surgery

## 2015-04-07 ENCOUNTER — Encounter (HOSPITAL_COMMUNITY)
Admission: RE | Disposition: A | Discharge status: Home / Self Care | Payer: Self-pay | Source: Ambulatory Visit | Attending: General Surgery

## 2015-04-07 ENCOUNTER — Ambulatory Visit (HOSPITAL_COMMUNITY)
Admission: RE | Admit: 2015-04-07 | Discharge: 2015-04-07 | Disposition: A | Discharge status: Home / Self Care | Payer: Medicare Other | Source: Ambulatory Visit | Attending: General Surgery | Admitting: General Surgery

## 2015-04-07 ENCOUNTER — Encounter (HOSPITAL_COMMUNITY): Payer: Self-pay | Admitting: *Deleted

## 2015-04-07 DIAGNOSIS — Z1211 Encounter for screening for malignant neoplasm of colon: Secondary | ICD-10-CM | POA: Diagnosis not present

## 2015-04-07 DIAGNOSIS — Z79899 Other long term (current) drug therapy: Secondary | ICD-10-CM | POA: Diagnosis not present

## 2015-04-07 DIAGNOSIS — I10 Essential (primary) hypertension: Secondary | ICD-10-CM | POA: Insufficient documentation

## 2015-04-07 DIAGNOSIS — Z96659 Presence of unspecified artificial knee joint: Secondary | ICD-10-CM | POA: Diagnosis not present

## 2015-04-07 HISTORY — PX: COLONOSCOPY: SHX5424

## 2015-04-07 SURGERY — COLONOSCOPY
Anesthesia: Moderate Sedation

## 2015-04-07 MED ORDER — MIDAZOLAM HCL 5 MG/5ML IJ SOLN
INTRAMUSCULAR | Status: DC | PRN
  Administered 2015-04-07: 2 mg via INTRAVENOUS

## 2015-04-07 MED ORDER — STERILE WATER FOR IRRIGATION IR SOLN
Status: DC | PRN
  Administered 2015-04-07: 08:00:00

## 2015-04-07 MED ORDER — MEPERIDINE HCL 50 MG/ML IJ SOLN
INTRAMUSCULAR | Status: DC | PRN
  Administered 2015-04-07: 50 mg via INTRAVENOUS

## 2015-04-07 MED ORDER — MIDAZOLAM HCL 5 MG/5ML IJ SOLN
INTRAMUSCULAR | Status: AC
  Filled 2015-04-07: qty 10

## 2015-04-07 MED ORDER — MEPERIDINE HCL 50 MG/ML IJ SOLN
INTRAMUSCULAR | Status: AC
  Filled 2015-04-07: qty 1

## 2015-04-07 NOTE — Op Note (Signed)
[facility] [address] [place], [address]   COLONOSCOPY PROCEDURE REPORT     EXAM DATE: 05-04-2015  PATIENT NAME:      Leslie Barrera, Leslie Barrera           MR #:      [id number]  BIRTHDATE:       02/16/1941      VISIT #:     [id number]  ATTENDING:     Aviva Signs, MD     STATUS:     outpatient ASSISTANT:  INDICATIONS:  The patient is a 74 yr old female here for a colonoscopy due to average risk patient for colon cancer. PROCEDURE PERFORMED:     Colonoscopy, screening MEDICATIONS:     Demerol 50 mg IV and Versed 2 mg IV ESTIMATED BLOOD LOSS:     None  CONSENT: The patient understands the risks and benefits of the procedure and understands that these risks include, but are not limited to: sedation, allergic reaction, infection, perforation and/or bleeding. Alternative means of evaluation and treatment include, among others: physical exam, x-rays, and/or surgical intervention. The patient elects to proceed with this endoscopic procedure.  DESCRIPTION OF PROCEDURE: During intra-op preparation period all mechanical & medical equipment was checked for proper function. Hand hygiene and appropriate measures for infection prevention was taken. After the risks, benefits and alternatives of the procedure were thoroughly explained, Informed consent was verified, confirmed and timeout was successfully executed by the treatment team. A digital exam revealed no abnormalities of the rectum. The EC-[id number] ([id number]) endoscope was introduced through the anus and advanced to the cecum, which was identified by both the appendix and ileocecal valve. adequate The instrument was then slowly withdrawn as the colon was fully examined.Estimated blood loss is zero unless otherwise noted in this procedure report.   COLON FINDINGS: A normal appearing cecum, ileocecal valve, and appendiceal orifice were identified.  The ascending, transverse, descending, sigmoid colon, and rectum  appeared unremarkable. Retroflexed views revealed no abnormalities. The scope was then completely withdrawn from the patient and the procedure terminated.  SCOPE WITHDRAWAL TIME: 6    ADVERSE EVENTS:      There were no immediate complications.  IMPRESSIONS:     Normal colonoscopy  RECOMMENDATIONS:     Return to the care of your primary provider. GI follow up as needed RECALL:  _____________________________ Aviva Signs, MD eSigned:  Aviva Signs, MD 05/04/2015 7:52 AM   cc:   CPT CODES: ICD CODES:  The ICD and CPT codes recommended by this software are interpretations from the data that the clinical staff has captured with the software.  The verification of the translation of this report to the ICD and CPT codes and modifiers is the sole responsibility of the health care institution and practicing physician where this report was generated.  [employer] will not be held responsible for the validity of the ICD and CPT codes included on this report.  AMA assumes no liability for data contained or not contained herein. CPT is a Designer, television/film set of the [employer].

## 2015-04-07 NOTE — Interval H&P Note (Signed)
History and Physical Interval Note:  04/07/2015 7:29 AM  Leslie Barrera  has presented today for surgery, with the diagnosis of screening  The various methods of treatment have been discussed with the patient and family. After consideration of risks, benefits and other options for treatment, the patient has consented to  Procedure(s): COLONOSCOPY (N/A) as a surgical intervention .  The patient's history has been reviewed, patient examined, no change in status, stable for surgery.  I have reviewed the patient's chart and labs.  Questions were answered to the patient's satisfaction.     Aviva Signs A

## 2015-04-07 NOTE — Discharge Instructions (Signed)
Colonoscopy, Care After °Refer to this sheet in the next few weeks. These instructions provide you with information on caring for yourself after your procedure. Your health care provider may also give you more specific instructions. Your treatment has been planned according to current medical practices, but problems sometimes occur. Call your health care provider if you have any problems or questions after your procedure. °WHAT TO EXPECT AFTER THE PROCEDURE  °After your procedure, it is typical to have the following: °· A small amount of blood in your stool. °· Moderate amounts of gas and mild abdominal cramping or bloating. °HOME CARE INSTRUCTIONS °· Do not drive, operate machinery, or sign important documents for 24 hours. °· You may shower and resume your regular physical activities, but move at a slower pace for the first 24 hours. °· Take frequent rest periods for the first 24 hours. °· Walk around or put a warm pack on your abdomen to help reduce abdominal cramping and bloating. °· Drink enough fluids to keep your urine clear or pale yellow. °· You may resume your normal diet as instructed by your health care provider. Avoid heavy or fried foods that are hard to digest. °· Avoid drinking alcohol for 24 hours or as instructed by your health care provider. °· Only take over-the-counter or prescription medicines as directed by your health care provider. °· If a tissue sample (biopsy) was taken during your procedure: °¨ Do not take aspirin or blood thinners for 7 days, or as instructed by your health care provider. °¨ Do not drink alcohol for 7 days, or as instructed by your health care provider. °¨ Eat soft foods for the first 24 hours. °SEEK MEDICAL CARE IF: °You have persistent spotting of blood in your stool 2-3 days after the procedure. °SEEK IMMEDIATE MEDICAL CARE IF: °· You have more than a small spotting of blood in your stool. °· You pass large blood clots in your stool. °· Your abdomen is swollen  (distended). °· You have nausea or vomiting. °· You have a fever. °· You have increasing abdominal pain that is not relieved with medicine. °  °This information is not intended to replace advice given to you by your health care provider. Make sure you discuss any questions you have with your health care provider. °  °Document Released: 01/26/2004 Document Revised: 04/03/2013 Document Reviewed: 02/18/2013 °Elsevier Interactive Patient Education ©2016 [employer] ° °

## 2015-04-13 ENCOUNTER — Encounter (HOSPITAL_COMMUNITY): Payer: Self-pay | Admitting: General Surgery

## 2015-10-19 DIAGNOSIS — L723 Sebaceous cyst: Secondary | ICD-10-CM | POA: Diagnosis not present

## 2015-11-19 DIAGNOSIS — L658 Other specified nonscarring hair loss: Secondary | ICD-10-CM | POA: Diagnosis not present

## 2015-11-19 DIAGNOSIS — B880 Other acariasis: Secondary | ICD-10-CM | POA: Diagnosis not present

## 2015-12-14 DIAGNOSIS — Z1389 Encounter for screening for other disorder: Secondary | ICD-10-CM | POA: Diagnosis not present

## 2015-12-14 DIAGNOSIS — R7309 Other abnormal glucose: Secondary | ICD-10-CM | POA: Diagnosis not present

## 2015-12-14 DIAGNOSIS — J069 Acute upper respiratory infection, unspecified: Secondary | ICD-10-CM | POA: Diagnosis not present

## 2015-12-14 DIAGNOSIS — E782 Mixed hyperlipidemia: Secondary | ICD-10-CM | POA: Diagnosis not present

## 2016-01-14 ENCOUNTER — Ambulatory Visit: Payer: Medicare Other | Admitting: Orthopedic Surgery

## 2016-01-26 ENCOUNTER — Encounter: Payer: Self-pay | Admitting: Orthopedic Surgery

## 2016-01-26 ENCOUNTER — Ambulatory Visit (HOSPITAL_COMMUNITY)
Admission: RE | Admit: 2016-01-26 | Discharge: 2016-01-26 | Disposition: A | Discharge status: Home / Self Care | Payer: Medicare Other | Source: Ambulatory Visit | Attending: Orthopedic Surgery | Admitting: Orthopedic Surgery

## 2016-01-26 ENCOUNTER — Ambulatory Visit (INDEPENDENT_AMBULATORY_CARE_PROVIDER_SITE_OTHER): Payer: Medicare Other | Admitting: Orthopedic Surgery

## 2016-01-26 VITALS — BP 135/72 | Ht 67.0 in | Wt 199.0 lb

## 2016-01-26 DIAGNOSIS — Z96651 Presence of right artificial knee joint: Secondary | ICD-10-CM | POA: Diagnosis not present

## 2016-01-26 DIAGNOSIS — Z471 Aftercare following joint replacement surgery: Secondary | ICD-10-CM | POA: Diagnosis not present

## 2016-01-26 IMAGING — DX DG KNEE AP/LAT W/ SUNRISE*R*
3 series · 3 of 3 positions shown · non-contrast
Comparison: [DATE]

CLINICAL DATA: Follow-up right knee replacement.

EXAM:
RIGHT KNEE 3 VIEWS

[knee ap]
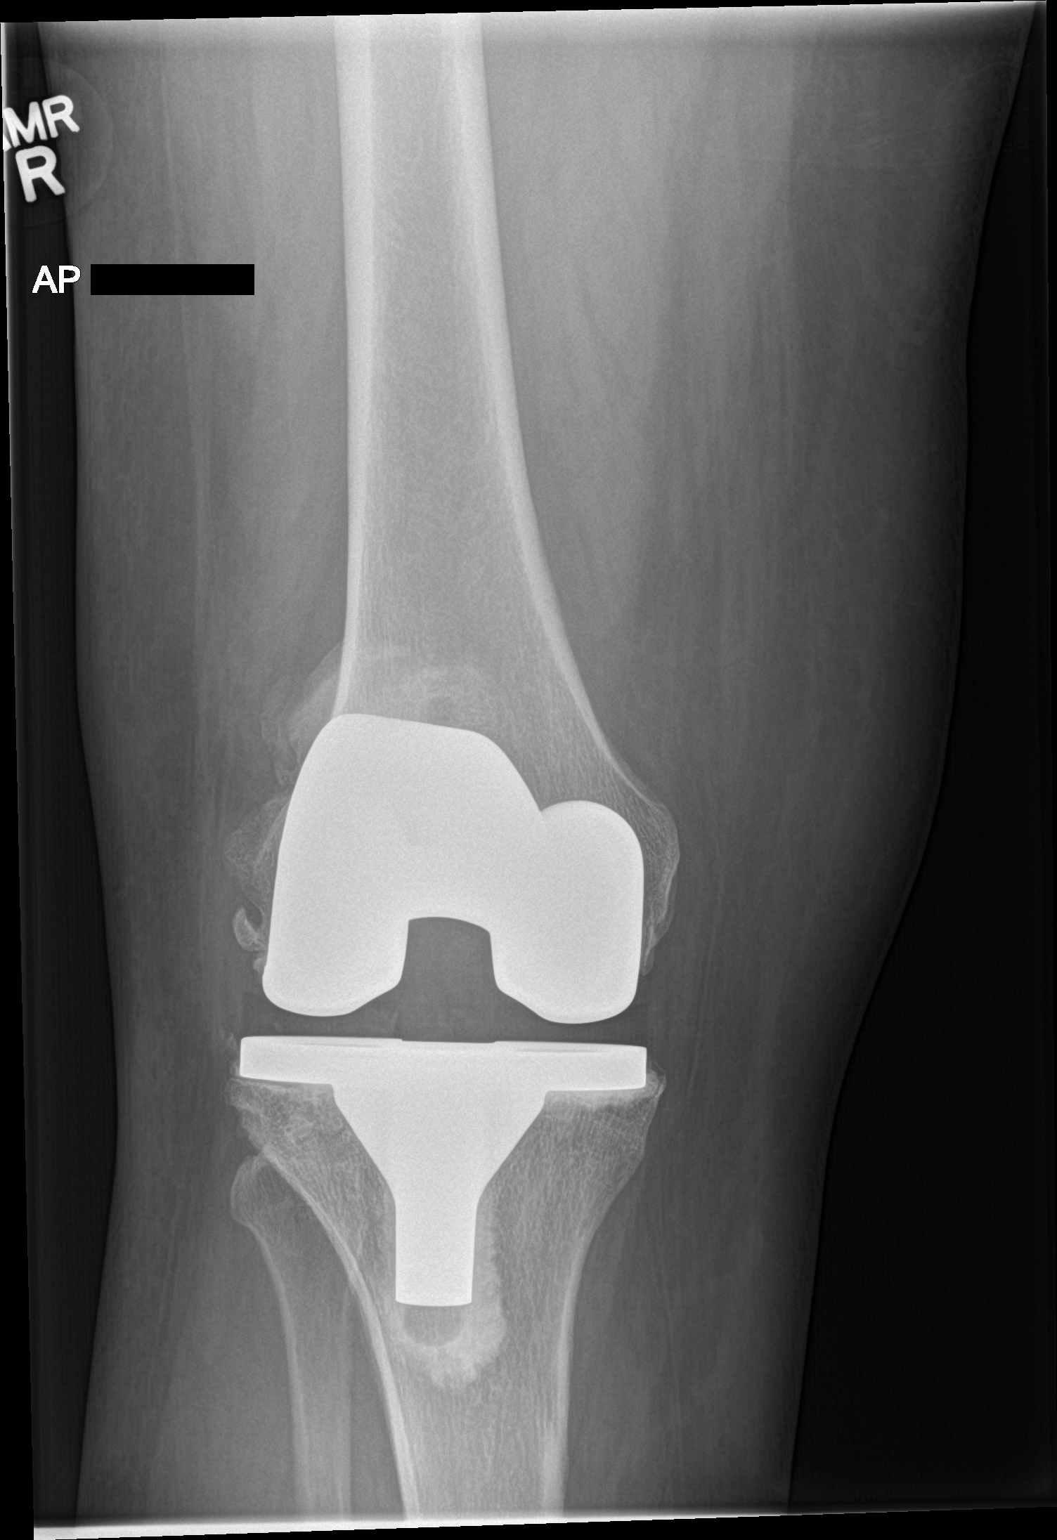

[knee lat]
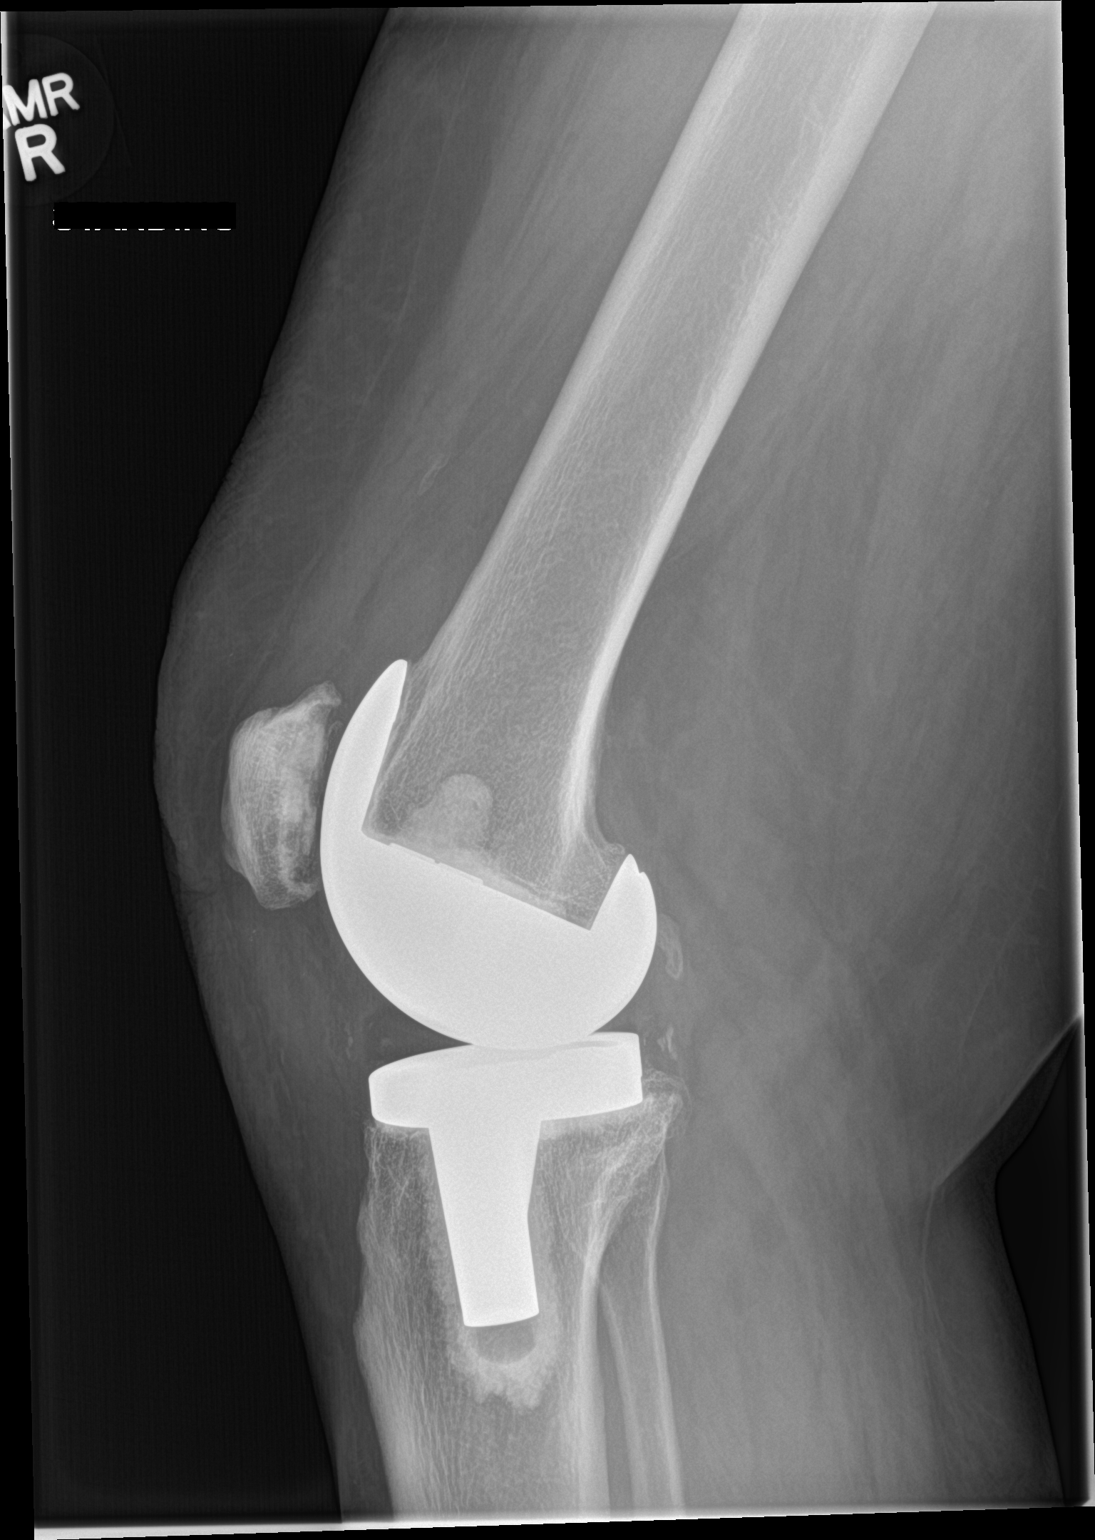

[knee sunrise]
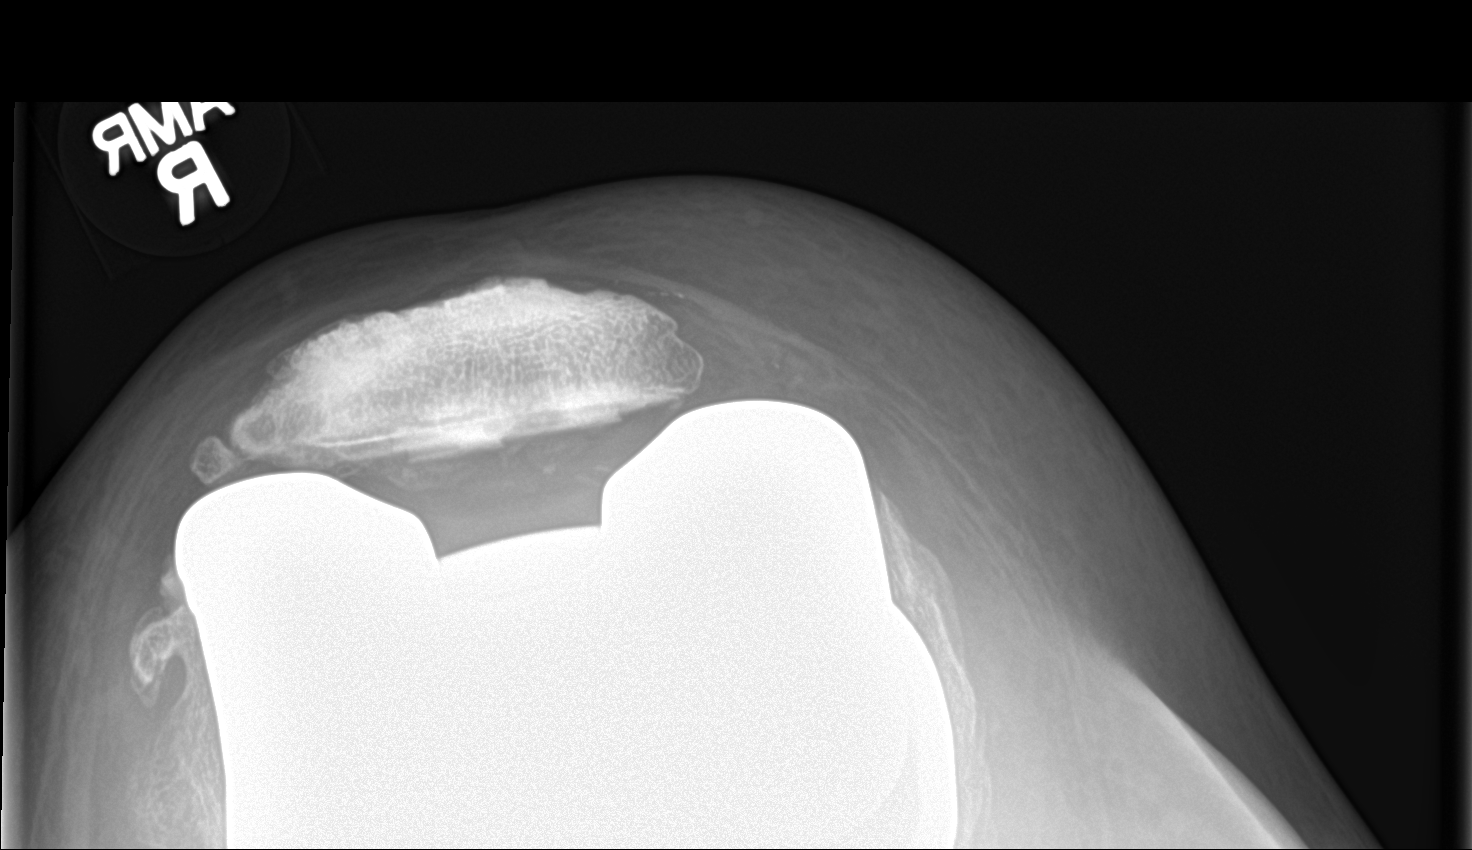

[3 of 3 positions shown; findings below may reference images not displayed]

FINDINGS: Probable small chronic joint effusion. The total knee arthroplasty
remains well seated. No periprosthetic fracture or new sclerosis.
Stable liner height.
IMPRESSION: Stable appearance of total knee arthroplasty.

## 2016-01-26 NOTE — Progress Notes (Signed)
Patient ID: Leslie Barrera, female   DOB: March 23, 1941, 75 y.o.   MRN: [id number]  Post op annual TKA   Chief Complaint  Patient presents with  . Follow-up    ANNUAL XRAY RT TKA, DOS 12/08/09    HPI Leslie Barrera is a 75 y.o. female.  Presents with no complaints regarding her right knee. It's doing well. She had a foot drop after valgus knee correction and completely recovered  Review of systems Complains of osteoarthritis pain left knee considering total knee surgery but not now   Past Medical History:  Diagnosis Date  . High cholesterol   . HTN (hypertension)     Past Surgical History:  Procedure Laterality Date  . COLONOSCOPY N/A 04/07/2015   Procedure: COLONOSCOPY;  Surgeon: Aviva Signs, MD;  Location: [facility];  Service: Gastroenterology;  Laterality: N/A;  . left knee arthroscopy  2006   Leslie Barrera  . left-great toe bunion    . Small toe surgery    . TOTAL KNEE ARTHROPLASTY Right 2009  . VESICOVAGINAL FISTULA CLOSURE W/ TAH       Allergies  Allergen Reactions  . Tetracycline Rash    Current Outpatient Prescriptions  Medication Sig Dispense Refill  . amoxicillin (AMOXIL) 500 MG capsule Take 2,000 mg by mouth as directed.    Marland Kitchen aspirin EC 81 MG tablet Take 81 mg by mouth daily.      . bisoprolol-hydrochlorothiazide (ZIAC) 10-6.25 MG per tablet Take 1 tablet by mouth daily.     . calcium-vitamin D (OSCAL WITH D) 500-200 MG-UNIT per tablet Take 1 tablet by mouth daily.      . furosemide (LASIX) 40 MG tablet Take 40 mg by mouth daily.     . simvastatin (ZOCOR) 40 MG tablet Take 40 mg by mouth at bedtime.       No current facility-administered medications for this visit.     Review of Systems Review of Systems Left knee pain  Physical Exam Blood pressure 135/72, height 5\' 7"  (1.702 m), weight 199 lb (90.3 kg).   Gen. appearance is normal there are no congenital abnormalities   The patient is oriented 3   Mood and affect are normal   Ambulation  is without assistive device   Knee inspection reveals a well-healed incision with no swelling   Knee flexion 115 with no extension deficit  Stability in the anteroposterior plane is normal as well as in the medial lateral plane  Motor exam reveals full extension without extensor lag   Data Reviewed KNEE XRAYS : Ordered and sent to the hospital for x-ray x-ray interpretation by me is she has a stable total knee with good alignment and no complications or loosening  Report also read as normal  Assessment S/P right TKA   Plan    Repeat x-ray one year       Leslie Barrera 01/26/2016, 10:19 AM

## 2016-02-16 ENCOUNTER — Other Ambulatory Visit (HOSPITAL_COMMUNITY): Payer: Self-pay | Admitting: Family Medicine

## 2016-02-16 DIAGNOSIS — Z1231 Encounter for screening mammogram for malignant neoplasm of breast: Secondary | ICD-10-CM

## 2016-02-24 ENCOUNTER — Encounter (HOSPITAL_COMMUNITY): Payer: Self-pay

## 2016-02-24 ENCOUNTER — Ambulatory Visit (HOSPITAL_COMMUNITY)
Admission: RE | Admit: 2016-02-24 | Discharge: 2016-02-24 | Disposition: A | Discharge status: Home / Self Care | Payer: Medicare Other | Source: Ambulatory Visit | Attending: Family Medicine | Admitting: Family Medicine

## 2016-02-24 DIAGNOSIS — Z1231 Encounter for screening mammogram for malignant neoplasm of breast: Secondary | ICD-10-CM

## 2016-03-17 ENCOUNTER — Ambulatory Visit (HOSPITAL_COMMUNITY)
Admission: RE | Admit: 2016-03-17 | Discharge: 2016-03-17 | Disposition: A | Discharge status: Home / Self Care | Payer: Medicare Other | Source: Ambulatory Visit | Attending: Family Medicine | Admitting: Family Medicine

## 2016-03-17 DIAGNOSIS — Z1231 Encounter for screening mammogram for malignant neoplasm of breast: Secondary | ICD-10-CM | POA: Diagnosis not present

## 2016-03-17 DIAGNOSIS — R921 Mammographic calcification found on diagnostic imaging of breast: Secondary | ICD-10-CM | POA: Diagnosis not present

## 2016-03-17 IMAGING — MG 2D DIGITAL SCREENING BILATERAL MAMMOGRAM WITH CAD AND ADJUNCT TO
6 of 10 series · 6 of 26 positions shown · non-contrast
Comparison: Previous exam(s).

CLINICAL DATA: Screening.

EXAM:
2D DIGITAL SCREENING BILATERAL MAMMOGRAM WITH CAD AND ADJUNCT TOMO

[R MLO (1 of 2)]
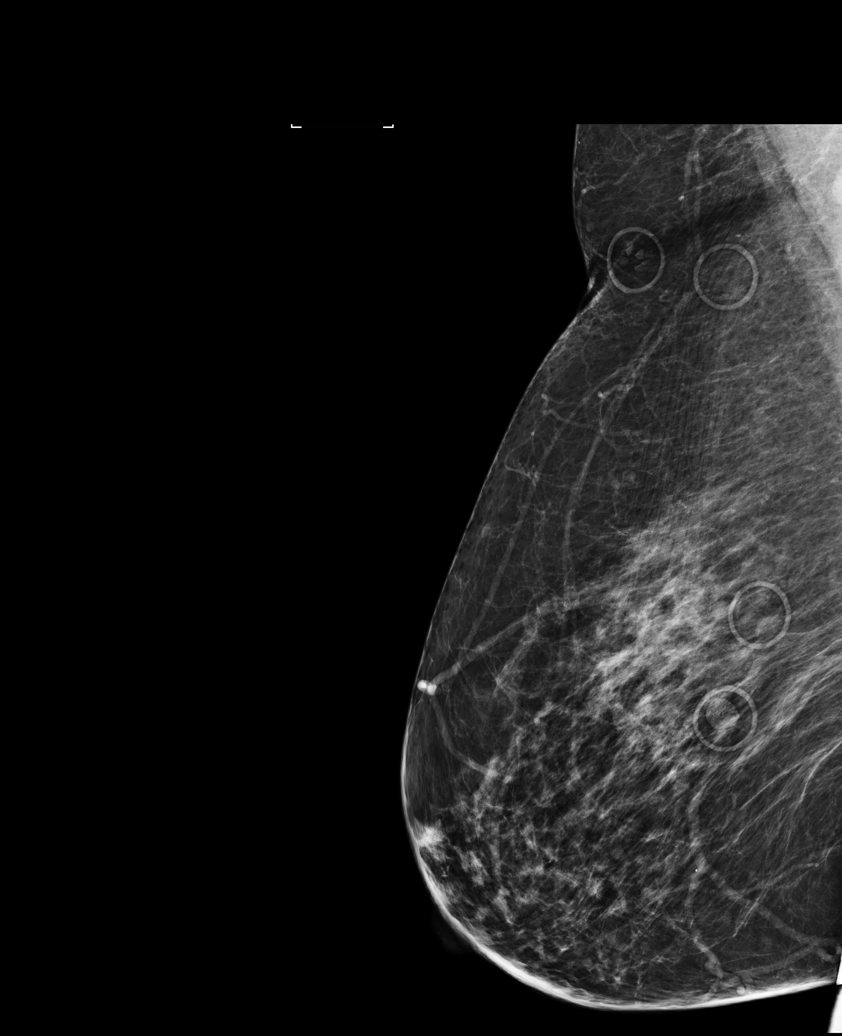

[L MLO (1 of 2)]
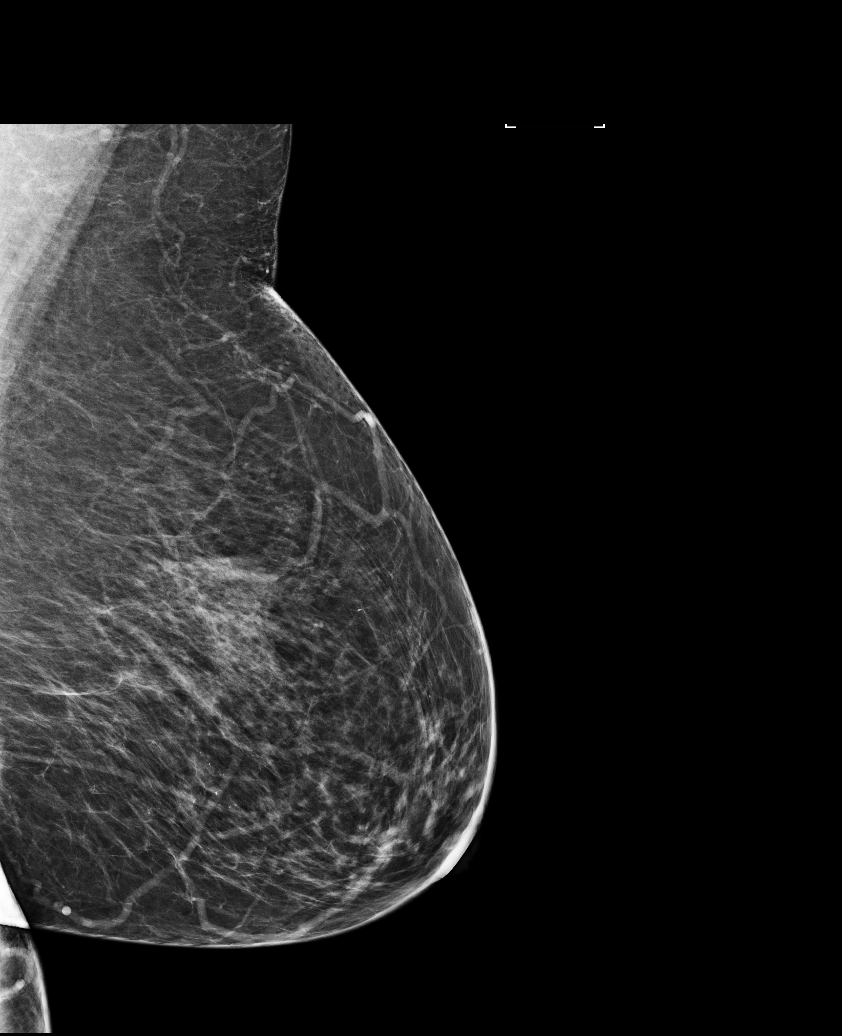

[R MLO (2 of 2)]
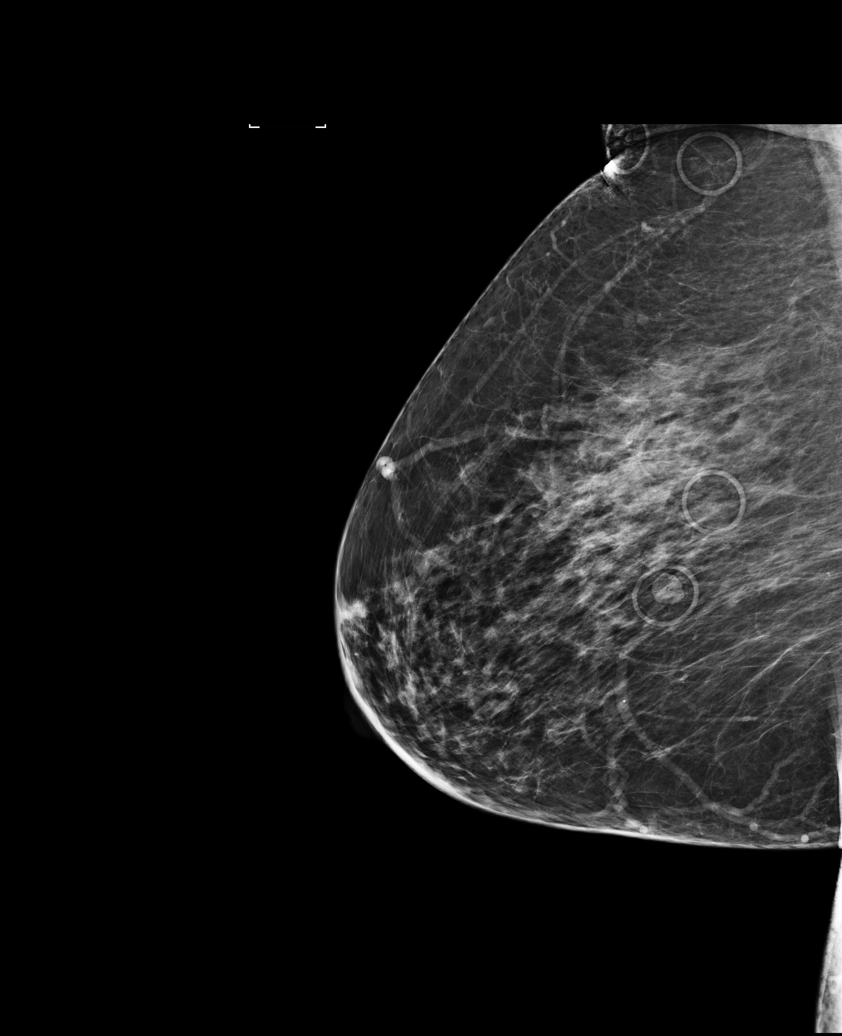

[R CC]
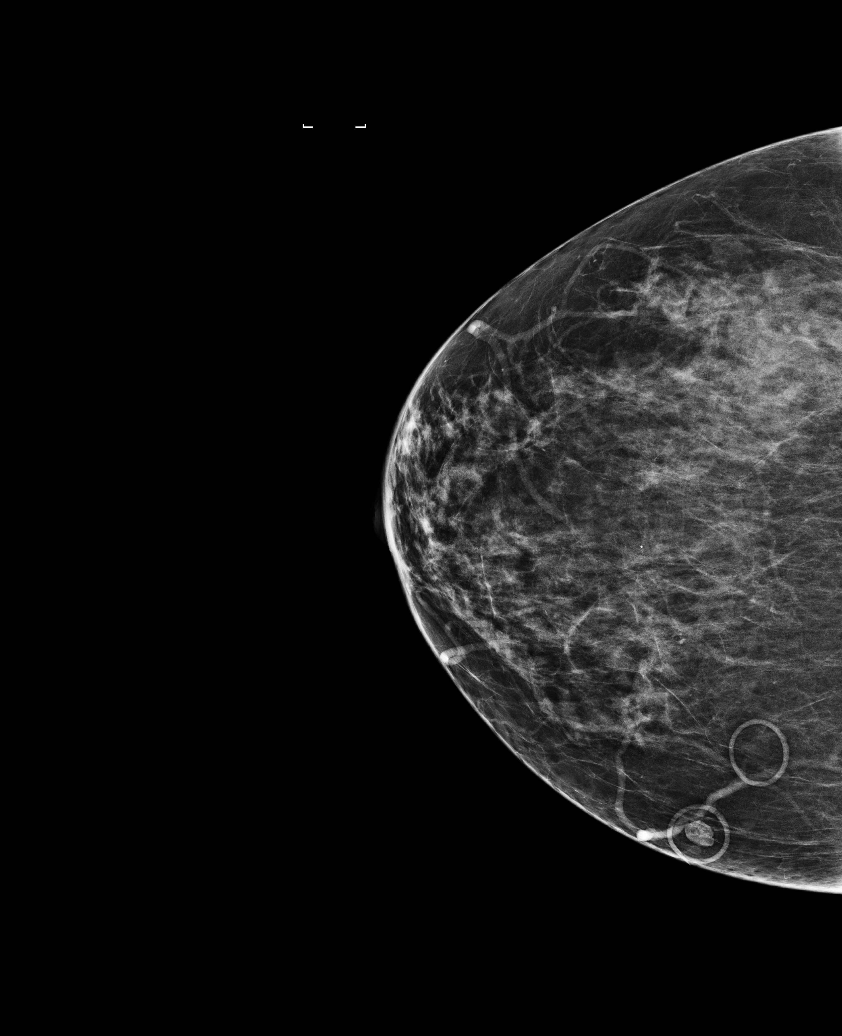

[L MLO (2 of 2)]
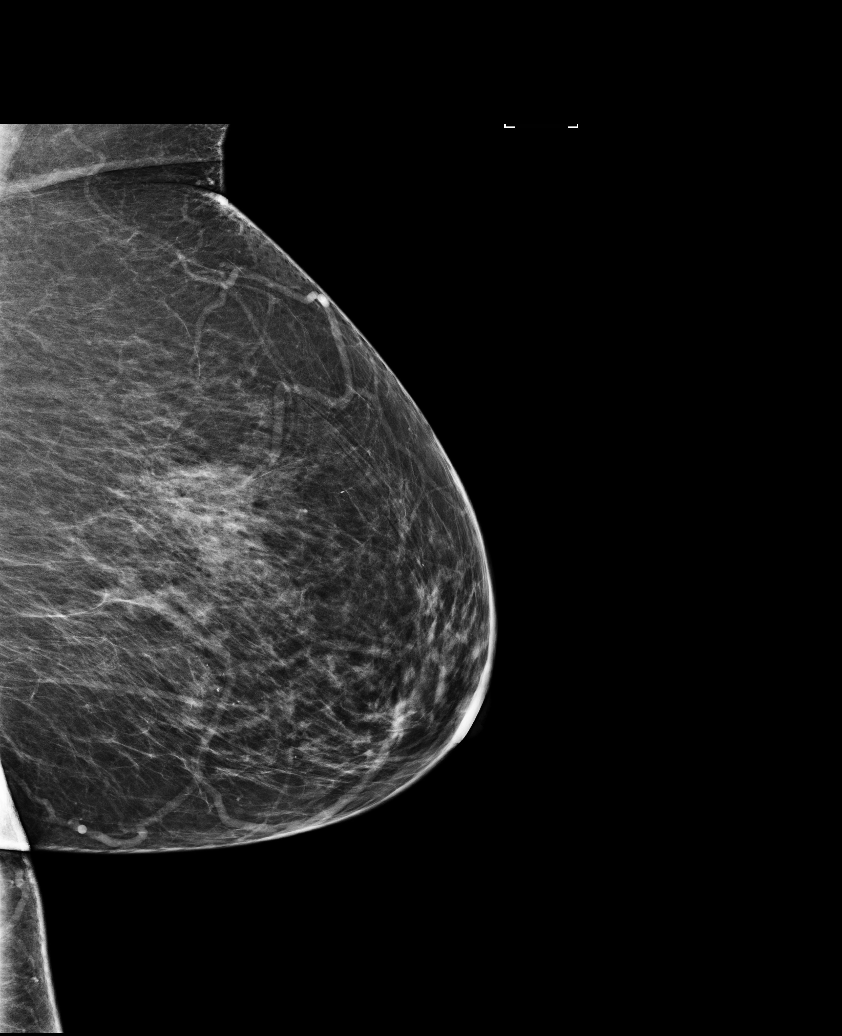

[L CC]
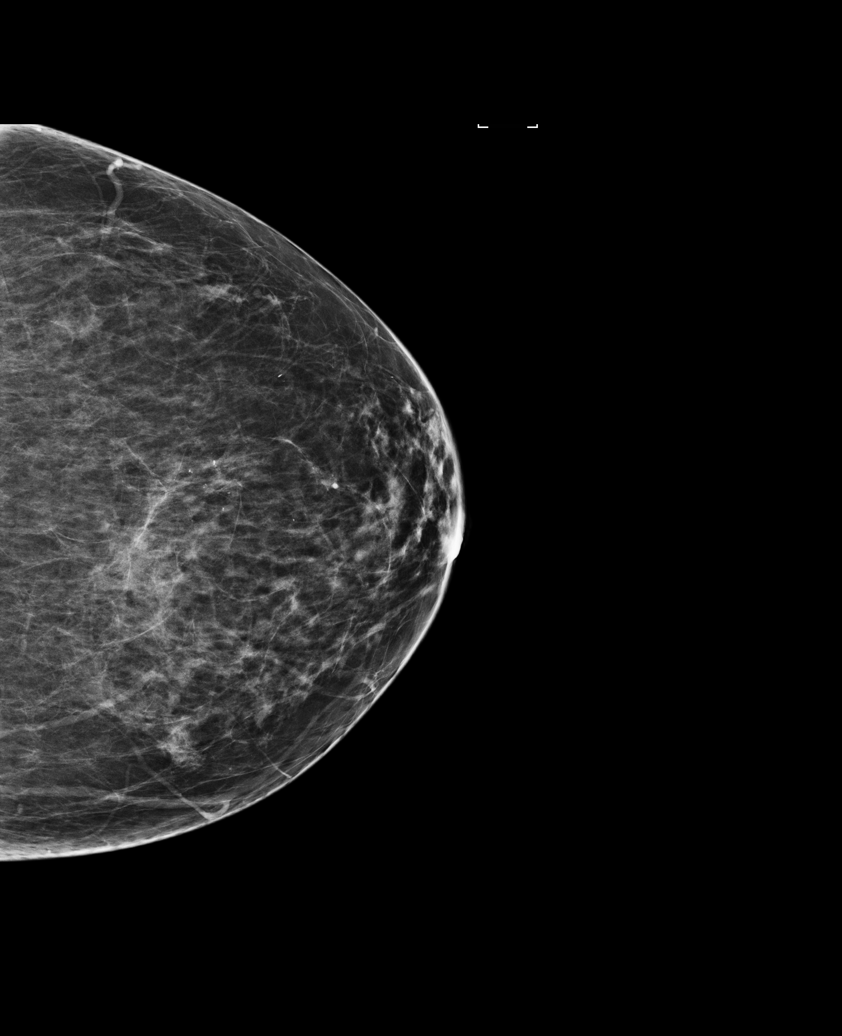

[6 of 26 positions shown; findings below may reference images not displayed]

ACR Breast Density Category b: There are scattered areas of
fibroglandular density.
FINDINGS: In the left breast, calcifications warrant further evaluation. In
the right breast, no findings suspicious for malignancy. Images were
processed with CAD.
IMPRESSION: Further evaluation is suggested for calcifications in the left
breast.

RECOMMENDATION:
Diagnostic mammogram of the left breast. (Code:[[place]])

The patient will be contacted regarding the findings, and additional
imaging will be scheduled.

BI-RADS CATEGORY  0: Incomplete. Need additional imaging evaluation
and/or prior mammograms for comparison.

## 2016-03-21 ENCOUNTER — Other Ambulatory Visit: Payer: Self-pay | Admitting: Family Medicine

## 2016-03-21 DIAGNOSIS — R928 Other abnormal and inconclusive findings on diagnostic imaging of breast: Secondary | ICD-10-CM

## 2016-03-29 ENCOUNTER — Other Ambulatory Visit (HOSPITAL_COMMUNITY): Payer: Self-pay | Admitting: Family Medicine

## 2016-03-29 DIAGNOSIS — R928 Other abnormal and inconclusive findings on diagnostic imaging of breast: Secondary | ICD-10-CM

## 2016-04-05 ENCOUNTER — Ambulatory Visit (HOSPITAL_COMMUNITY)
Admission: RE | Admit: 2016-04-05 | Discharge: 2016-04-05 | Disposition: A | Discharge status: Home / Self Care | Payer: Medicare Other | Source: Ambulatory Visit | Attending: Family Medicine | Admitting: Family Medicine

## 2016-04-05 DIAGNOSIS — R921 Mammographic calcification found on diagnostic imaging of breast: Secondary | ICD-10-CM | POA: Diagnosis not present

## 2016-04-05 DIAGNOSIS — R928 Other abnormal and inconclusive findings on diagnostic imaging of breast: Secondary | ICD-10-CM

## 2016-04-19 DIAGNOSIS — H524 Presbyopia: Secondary | ICD-10-CM | POA: Diagnosis not present

## 2016-04-19 DIAGNOSIS — H52221 Regular astigmatism, right eye: Secondary | ICD-10-CM | POA: Diagnosis not present

## 2016-04-19 DIAGNOSIS — H5203 Hypermetropia, bilateral: Secondary | ICD-10-CM | POA: Diagnosis not present

## 2016-04-19 DIAGNOSIS — H35462 Secondary vitreoretinal degeneration, left eye: Secondary | ICD-10-CM | POA: Diagnosis not present

## 2016-05-12 DIAGNOSIS — R7309 Other abnormal glucose: Secondary | ICD-10-CM | POA: Diagnosis not present

## 2016-05-12 DIAGNOSIS — Z1389 Encounter for screening for other disorder: Secondary | ICD-10-CM | POA: Diagnosis not present

## 2016-05-12 DIAGNOSIS — E782 Mixed hyperlipidemia: Secondary | ICD-10-CM | POA: Diagnosis not present

## 2016-05-12 DIAGNOSIS — I10 Essential (primary) hypertension: Secondary | ICD-10-CM | POA: Diagnosis not present

## 2016-05-12 DIAGNOSIS — E559 Vitamin D deficiency, unspecified: Secondary | ICD-10-CM | POA: Diagnosis not present

## 2016-06-13 DIAGNOSIS — Z1389 Encounter for screening for other disorder: Secondary | ICD-10-CM | POA: Diagnosis not present

## 2016-06-13 DIAGNOSIS — E559 Vitamin D deficiency, unspecified: Secondary | ICD-10-CM | POA: Diagnosis not present

## 2016-06-13 DIAGNOSIS — R7309 Other abnormal glucose: Secondary | ICD-10-CM | POA: Diagnosis not present

## 2016-11-14 ENCOUNTER — Other Ambulatory Visit (HOSPITAL_COMMUNITY): Payer: Self-pay | Admitting: Family Medicine

## 2016-11-14 ENCOUNTER — Encounter: Payer: Self-pay | Admitting: Orthopedic Surgery

## 2016-11-14 DIAGNOSIS — Z1231 Encounter for screening mammogram for malignant neoplasm of breast: Secondary | ICD-10-CM

## 2016-11-14 DIAGNOSIS — Z09 Encounter for follow-up examination after completed treatment for conditions other than malignant neoplasm: Secondary | ICD-10-CM

## 2016-11-30 DIAGNOSIS — R921 Mammographic calcification found on diagnostic imaging of breast: Secondary | ICD-10-CM | POA: Diagnosis not present

## 2016-12-01 ENCOUNTER — Other Ambulatory Visit: Payer: Self-pay | Admitting: Family Medicine

## 2016-12-01 DIAGNOSIS — R921 Mammographic calcification found on diagnostic imaging of breast: Secondary | ICD-10-CM

## 2016-12-13 ENCOUNTER — Ambulatory Visit
Admission: RE | Admit: 2016-12-13 | Discharge: 2016-12-13 | Disposition: A | Discharge status: Home / Self Care | Payer: Medicare Other | Source: Ambulatory Visit | Attending: Family Medicine | Admitting: Family Medicine

## 2016-12-13 DIAGNOSIS — N6324 Unspecified lump in the left breast, lower inner quadrant: Secondary | ICD-10-CM | POA: Diagnosis not present

## 2016-12-13 DIAGNOSIS — R921 Mammographic calcification found on diagnostic imaging of breast: Secondary | ICD-10-CM

## 2016-12-13 IMAGING — MG STEREOTACTIC CORE NEEDLE BIOPSY
1 series · 1 of 1 positions shown · non-contrast
Comparison: Previous exams.

ADDENDUM:
Pathology revealed FIBROADENOMATOID NODULE WITH CALCIFICATIONS of
the Left breast, lower inner quadrant. This was found to be
concordant by Dr. AGNIESZKA KINGA. Pathology results were discussed
with the patient by telephone. The patient reported doing well after
the biopsy with tenderness at the site. Post biopsy instructions and
care were reviewed and questions were answered. The patient was
encouraged to call The [REDACTED] for any
additional concerns. The patient was instructed to return for annual
screening mammography at [HOSPITAL] in [HOSPITAL][HOSPITAL].

Pathology results reported by AGNIESZKA KINGA, RN on [DATE].
CLINICAL DATA: Patient presents for stereotactic guided core biopsy
of calcifications in the lower inner quadrant of the left breast.
EXAM:
LEFT BREAST STEREOTACTIC CORE NEEDLE BIOPSY

[L SPECIMEN]
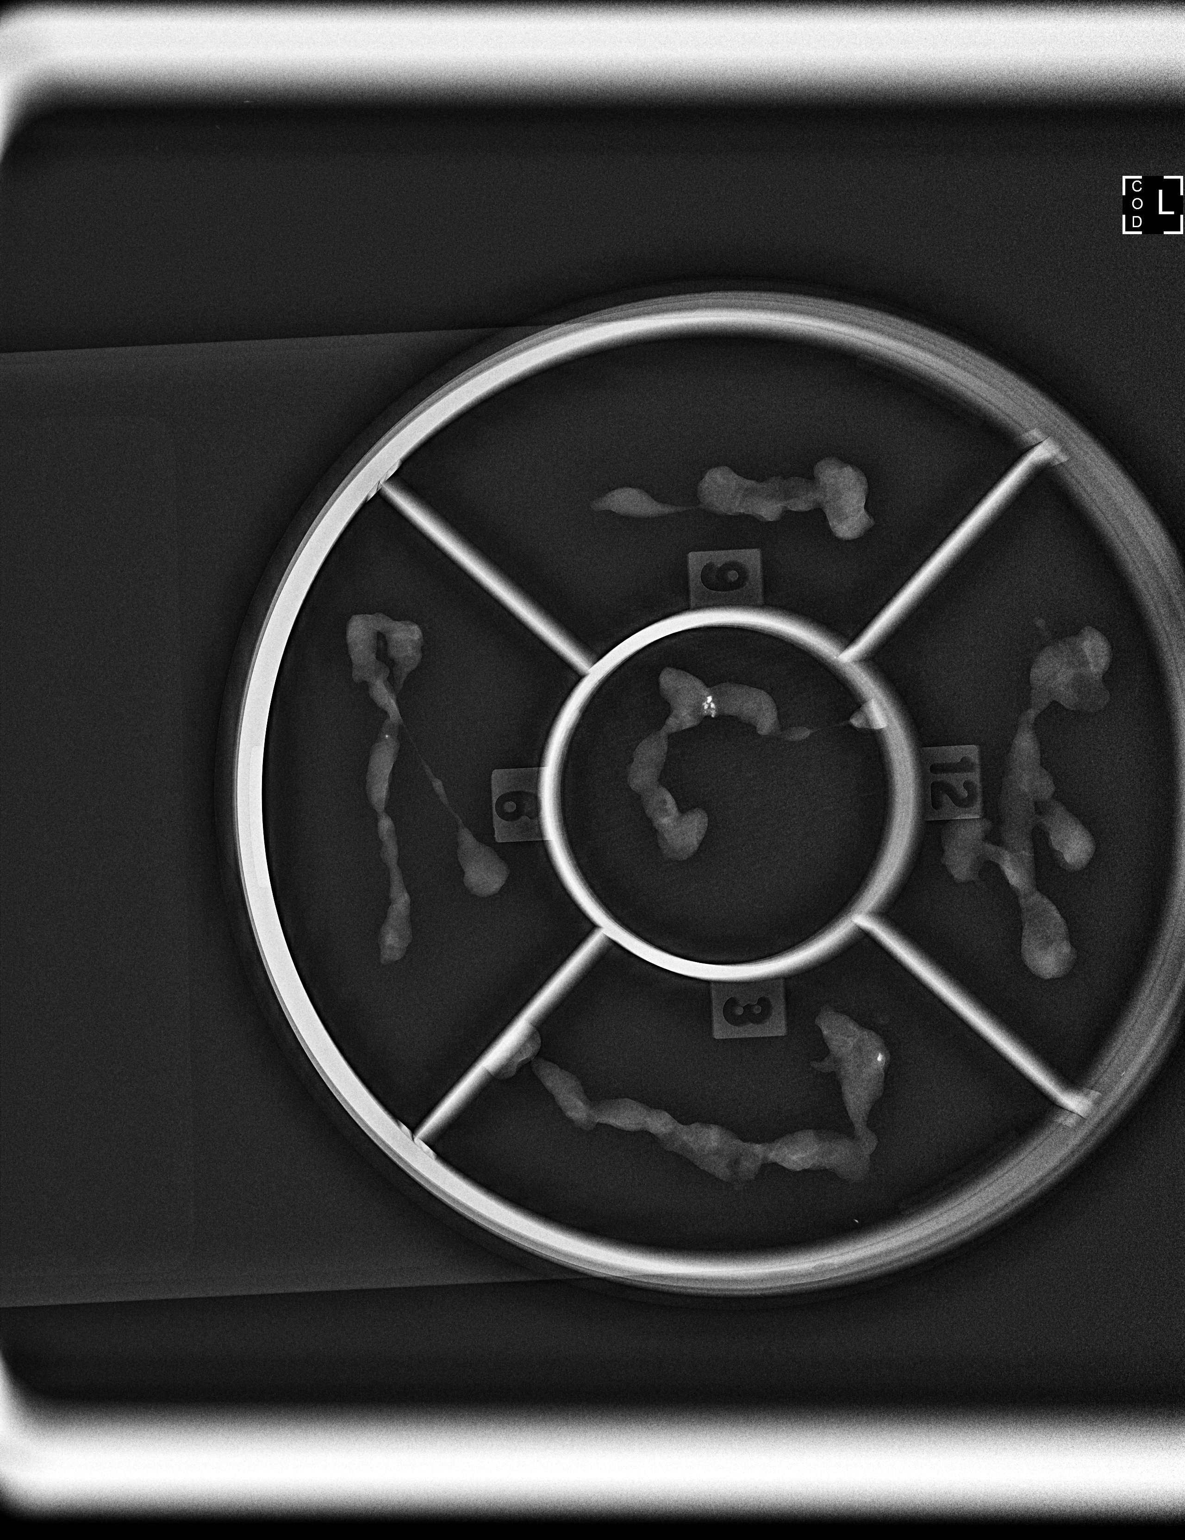

[1 of 1 positions shown; findings below may reference images not displayed]



Using sterile technique and 1% Lidocaine as local anesthetic, under
stereotactic guidance, a 9 gauge vacuum assisted device was used to
perform core needle biopsy of calcifications in the lower inner
quadrant of the left breast using a medial approach. Specimen
radiograph was performed showing calcifications to be present.
Specimens with calcifications are identified for pathology.

Lesion quadrant: Lower inner quadrant left breast

At the conclusion of the procedure, a coil shaped tissue marker clip
was deployed into the biopsy cavity. Follow-up 2-view mammogram was
performed and dictated separately.
IMPRESSION: Stereotactic-guided biopsy of left breast calcifications. No
apparent complications.

## 2016-12-13 IMAGING — MG MM CLIP PLACEMENT
2 series · 2 of 2 positions shown · non-contrast
Comparison: Previous exam(s).

CLINICAL DATA: Status post stereotactic guided core biopsy
calcifications in the lower inner quadrant of the left breast.

EXAM:
DIAGNOSTIC LEFT MAMMOGRAM POST STEREOTACTIC BIOPSY

[L CC]
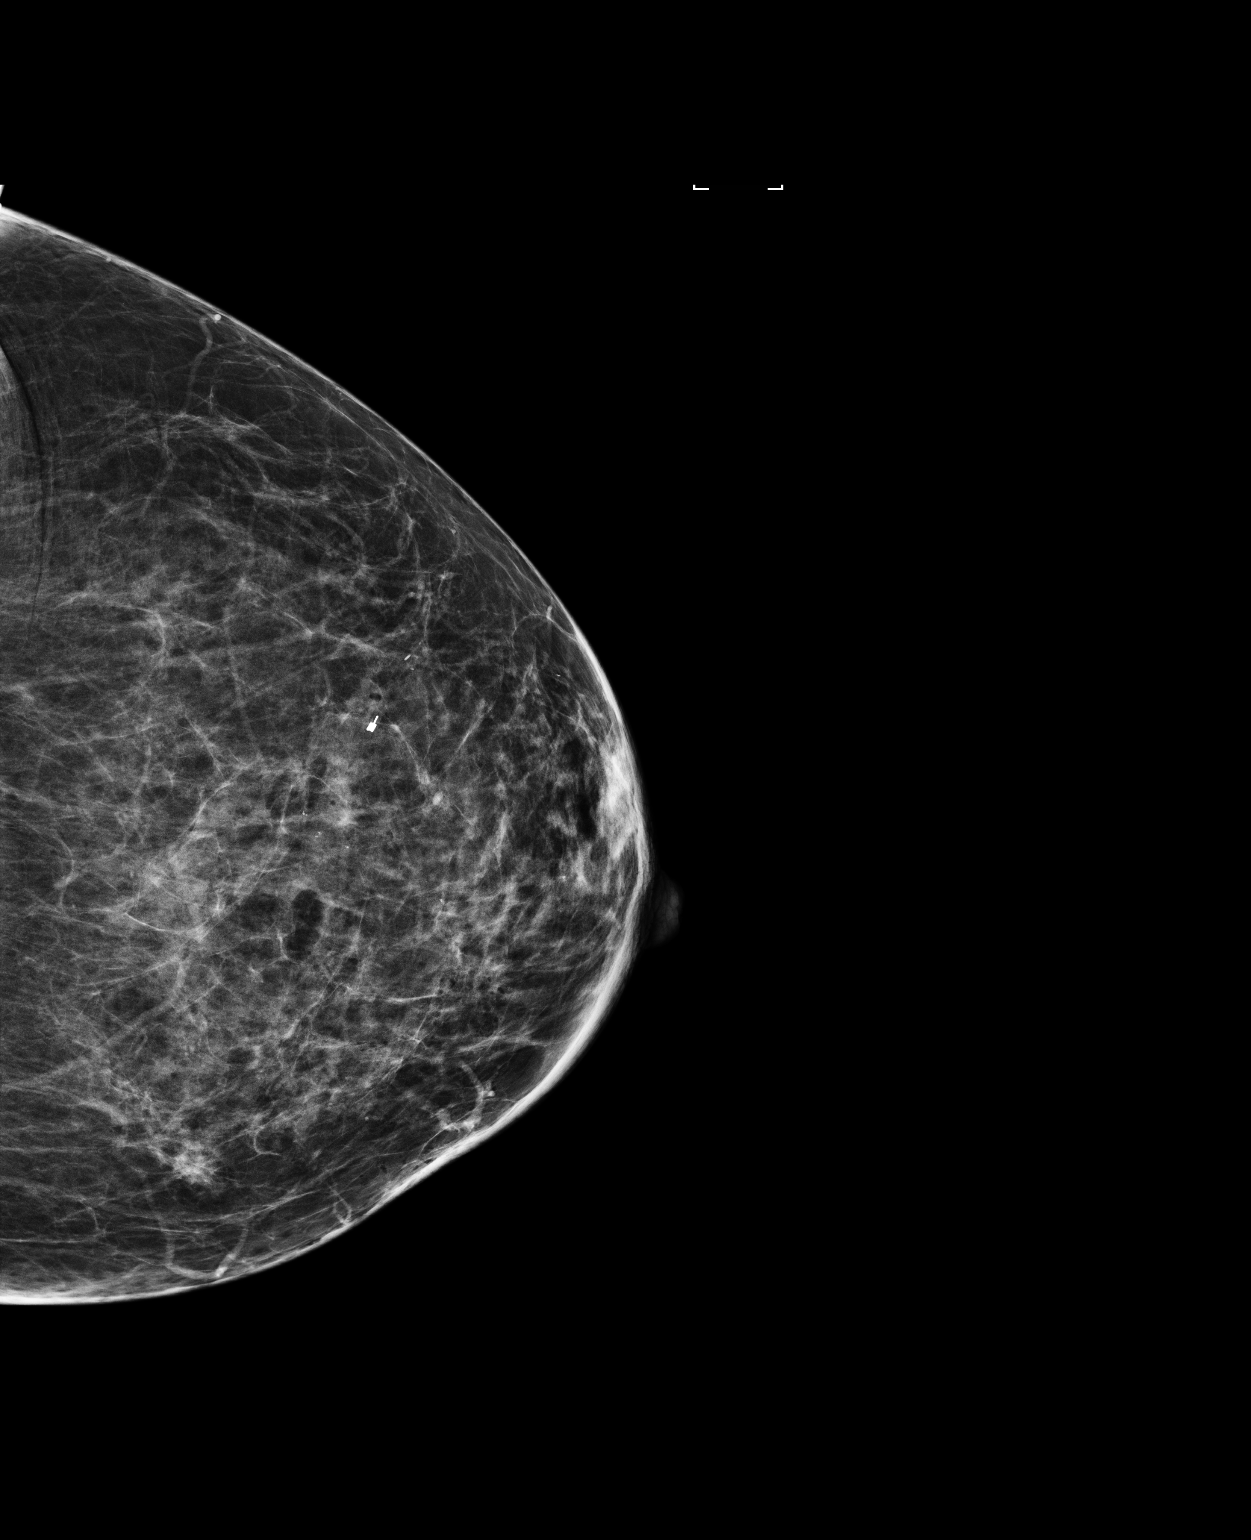

[L ML]
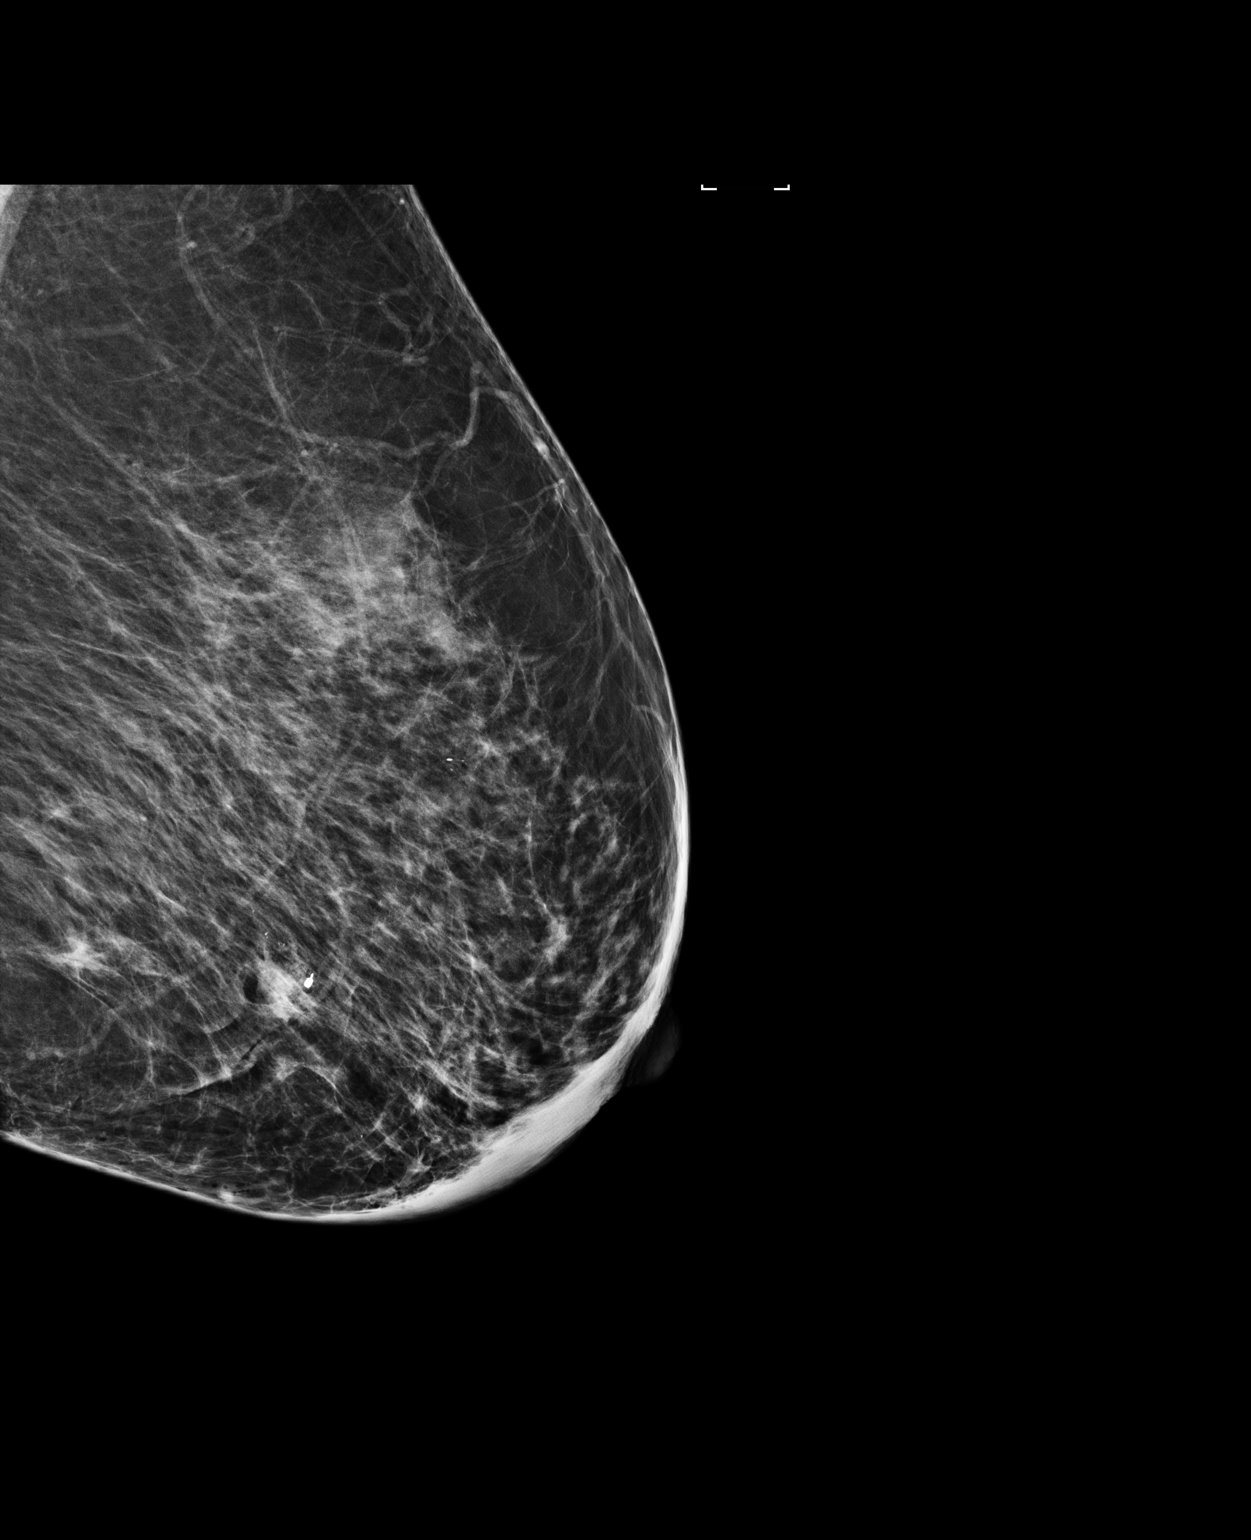

[2 of 2 positions shown; findings below may reference images not displayed]

FINDINGS: Mammographic images were obtained following stereotactic guided
biopsy of calcifications in the lower inner quadrant of the left
breast. A coil shaped clip is identified in the lower outer quadrant
of the left breast approximately 1.5 cm lateral to the group of
calcifications biopsy.
IMPRESSION: Tissue marker clip is 1.5 cm lateral to the biopsied calcifications.

Final Assessment: Post Procedure Mammograms for Marker Placement

## 2016-12-13 IMAGING — MG STEREOTACTIC CORE NEEDLE BIOPSY
6 series · 6 of 14 positions shown · non-contrast
Comparison: Previous exams.

ADDENDUM:
Pathology revealed FIBROADENOMATOID NODULE WITH CALCIFICATIONS of
the Left breast, lower inner quadrant. This was found to be
concordant by Dr. AGNIESZKA KINGA. Pathology results were discussed
with the patient by telephone. The patient reported doing well after
the biopsy with tenderness at the site. Post biopsy instructions and
care were reviewed and questions were answered. The patient was
encouraged to call The [REDACTED] for any
additional concerns. The patient was instructed to return for annual
screening mammography at [HOSPITAL] in [HOSPITAL][HOSPITAL].

Pathology results reported by AGNIESZKA KINGA, RN on [DATE].
CLINICAL DATA: Patient presents for stereotactic guided core biopsy
of calcifications in the lower inner quadrant of the left breast.
EXAM:
LEFT BREAST STEREOTACTIC CORE NEEDLE BIOPSY

[L ML (1 of 4)]
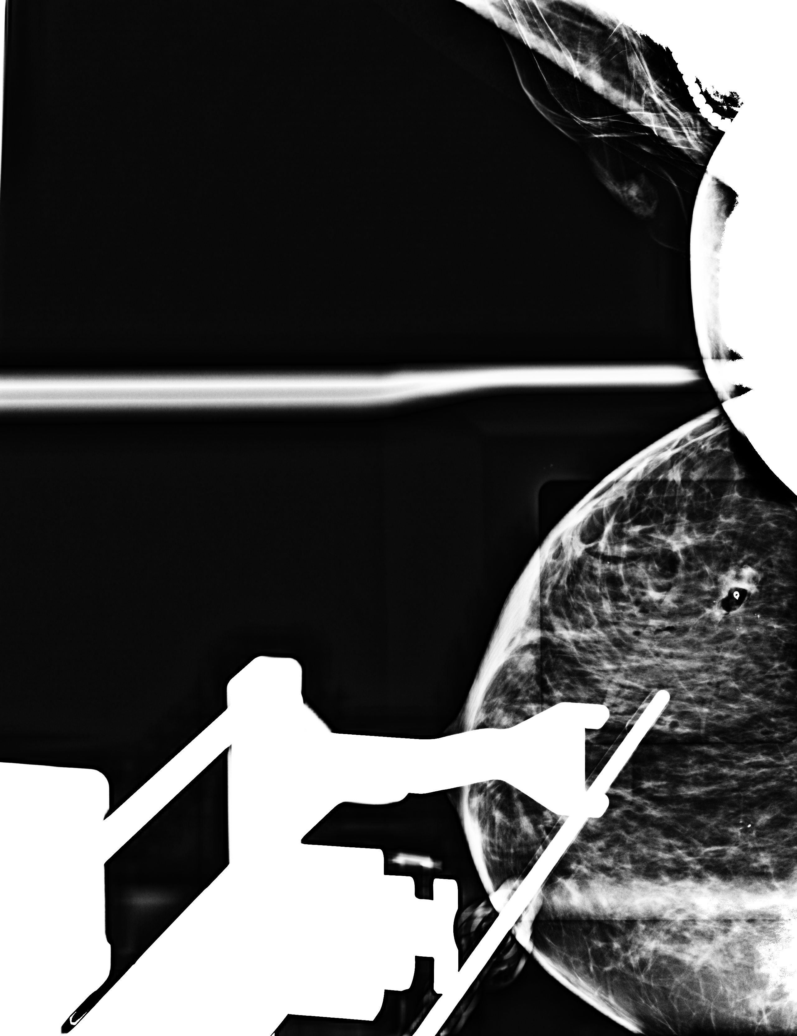

[L ML (2 of 4)]
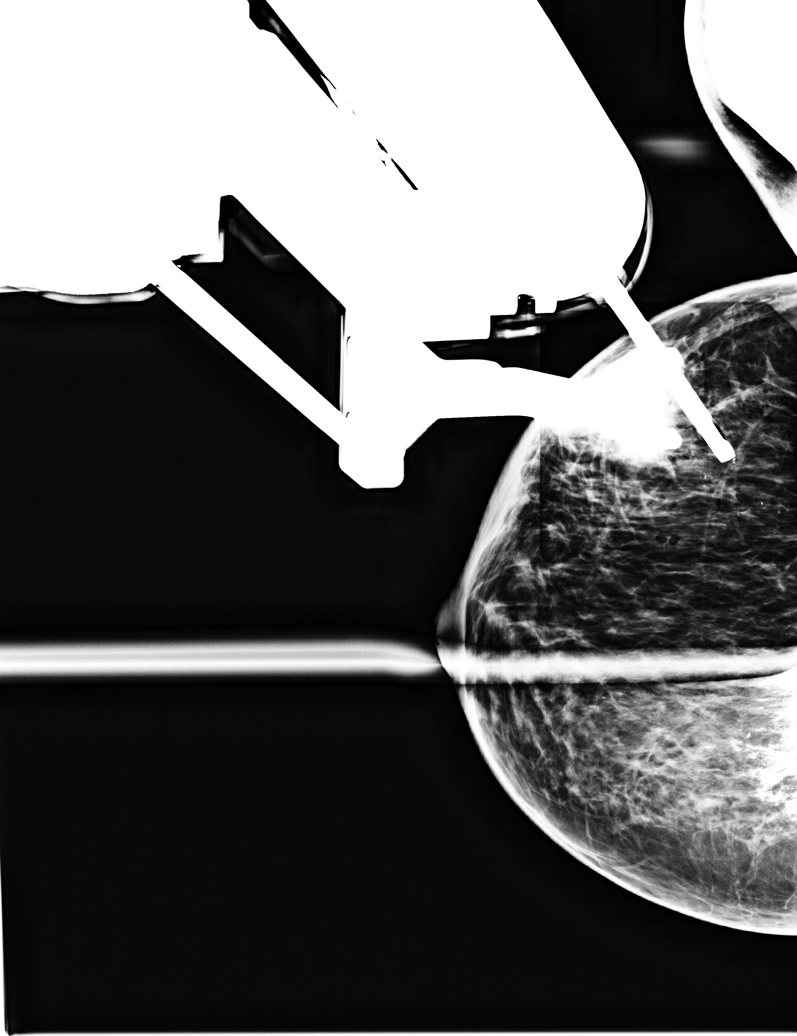

[L ML (3 of 4)]
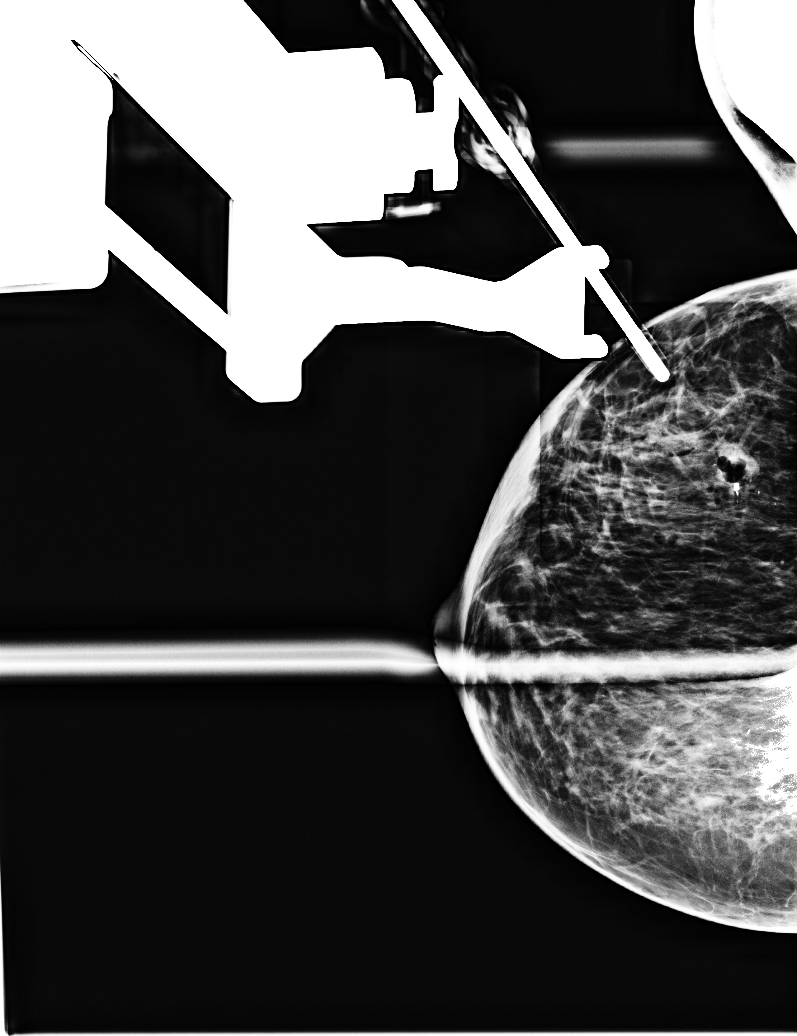

[L ML (4 of 4)]
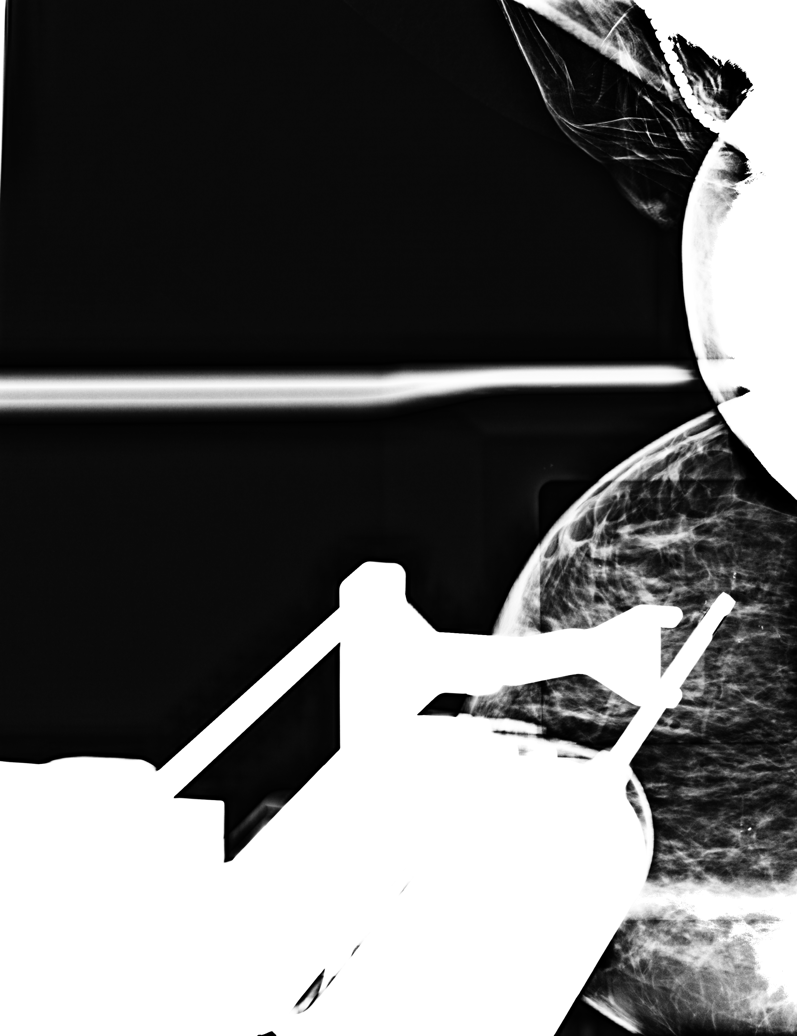

[L ML tomo (1 of 2) · tomo slice 25/50.0]
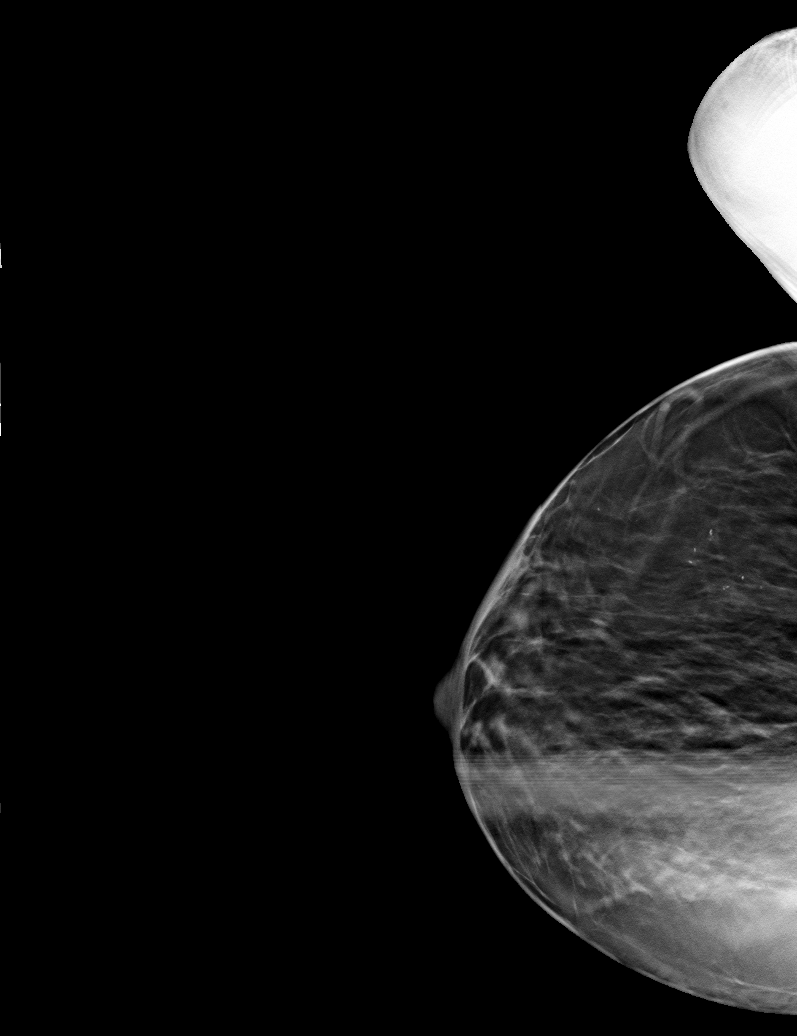

[L ML tomo (2 of 2) · tomo slice 25/49.0]
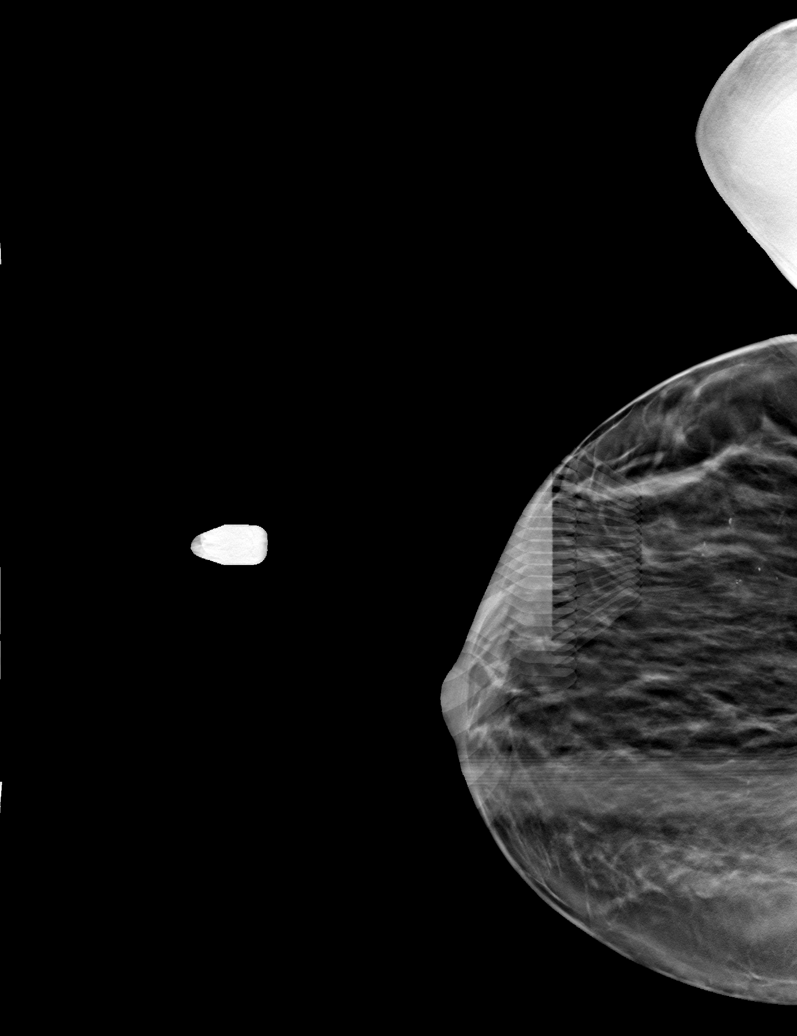

[6 of 14 positions shown; findings below may reference images not displayed]



Using sterile technique and 1% Lidocaine as local anesthetic, under
stereotactic guidance, a 9 gauge vacuum assisted device was used to
perform core needle biopsy of calcifications in the lower inner
quadrant of the left breast using a medial approach. Specimen
radiograph was performed showing calcifications to be present.
Specimens with calcifications are identified for pathology.

Lesion quadrant: Lower inner quadrant left breast

At the conclusion of the procedure, a coil shaped tissue marker clip
was deployed into the biopsy cavity. Follow-up 2-view mammogram was
performed and dictated separately.
IMPRESSION: Stereotactic-guided biopsy of left breast calcifications. No
apparent complications.

## 2017-01-24 ENCOUNTER — Ambulatory Visit: Payer: Medicare Other | Admitting: Orthopedic Surgery

## 2017-01-25 ENCOUNTER — Ambulatory Visit: Payer: Medicare Other | Admitting: Orthopedic Surgery

## 2017-01-30 ENCOUNTER — Ambulatory Visit (INDEPENDENT_AMBULATORY_CARE_PROVIDER_SITE_OTHER): Payer: Medicare Other | Admitting: Orthopedic Surgery

## 2017-01-30 ENCOUNTER — Ambulatory Visit (INDEPENDENT_AMBULATORY_CARE_PROVIDER_SITE_OTHER): Payer: Medicare Other

## 2017-01-30 DIAGNOSIS — Z96651 Presence of right artificial knee joint: Secondary | ICD-10-CM

## 2017-01-30 DIAGNOSIS — M1712 Unilateral primary osteoarthritis, left knee: Secondary | ICD-10-CM

## 2017-01-30 NOTE — Progress Notes (Signed)
ANNUAL FOLLOW UP FOR  right TKA   Chief Complaint  Patient presents with  . Follow-up    Recheck on right total knee replacement, DOS 12-08-09.    HPI: The patient is here for the annual  follow-up x-ray for knee replacement. She denies any problems with the right knee  This is 7 years postop right total knee. Fixed-bearing Sigma  However, she says that the left knee is starting to cause her some pain in his somewhat deformed. She has been able to perform activities of daily living with little discomfort. There is no associated swelling but she does note some giving way  Review of Systems  Respiratory: Negative for shortness of breath.   Cardiovascular: Negative for chest pain.  Musculoskeletal:       No mechanical symptoms of catching locking or giving way    Past Medical History:  Diagnosis Date  . High cholesterol   . HTN (hypertension)     Examination of the right  KNEE   Inspection shows : incision healed nicely without erythema, no tenderness no swelling  Range of motion total range of motion is 115  Stability the knee is stable anterior to posterior as well as medial to lateral  Strength quadriceps strength is normal  Skin no erythema around the skin incision  Cardiovascular NO EDEMA   Left knee Valgus alignment Flexion arc 120 Ligaments stable Muscle strength normal Neurovascular exam normal   Medical decision-making section  X-rays ordered with the following personal interpretation  Normal alignment without loosening   Diagnosis  Encounter Diagnoses  Name Primary?  . Status post right knee replacement Yes  . History of right knee joint replacement   . Primary osteoarthritis of left knee    Osteoarthritis left knee needs x-ray in 6 months  Plan follow-up 6 months x-ray left knee

## 2017-03-27 DIAGNOSIS — R7309 Other abnormal glucose: Secondary | ICD-10-CM | POA: Diagnosis not present

## 2017-03-27 DIAGNOSIS — Z23 Encounter for immunization: Secondary | ICD-10-CM | POA: Diagnosis not present

## 2017-03-27 DIAGNOSIS — M1991 Primary osteoarthritis, unspecified site: Secondary | ICD-10-CM | POA: Diagnosis not present

## 2017-03-27 DIAGNOSIS — M25572 Pain in left ankle and joints of left foot: Secondary | ICD-10-CM | POA: Diagnosis not present

## 2017-03-27 DIAGNOSIS — Z1389 Encounter for screening for other disorder: Secondary | ICD-10-CM | POA: Diagnosis not present

## 2017-03-27 DIAGNOSIS — R946 Abnormal results of thyroid function studies: Secondary | ICD-10-CM | POA: Diagnosis not present

## 2017-03-30 ENCOUNTER — Other Ambulatory Visit (HOSPITAL_COMMUNITY): Payer: Self-pay | Admitting: Family Medicine

## 2017-03-30 DIAGNOSIS — E2839 Other primary ovarian failure: Secondary | ICD-10-CM

## 2017-04-06 ENCOUNTER — Encounter (HOSPITAL_COMMUNITY): Payer: Self-pay

## 2017-04-06 ENCOUNTER — Other Ambulatory Visit (HOSPITAL_COMMUNITY): Payer: Medicare Other

## 2017-04-24 ENCOUNTER — Ambulatory Visit (HOSPITAL_COMMUNITY)
Admission: RE | Admit: 2017-04-24 | Discharge: 2017-04-24 | Disposition: A | Discharge status: Home / Self Care | Payer: Medicare Other | Source: Ambulatory Visit | Attending: Family Medicine | Admitting: Family Medicine

## 2017-04-24 ENCOUNTER — Other Ambulatory Visit (HOSPITAL_COMMUNITY): Payer: Self-pay | Admitting: Family Medicine

## 2017-04-24 DIAGNOSIS — M25572 Pain in left ankle and joints of left foot: Secondary | ICD-10-CM

## 2017-04-24 IMAGING — DX DG ANKLE COMPLETE 3+V*L*
3 series · 3 of 3 positions shown · non-contrast
Comparison: Radiographs [DATE].

CLINICAL DATA: Left ankle pain for 1 month without known injury.

EXAM:
LEFT ANKLE COMPLETE - 3+ VIEW

[ankle ap]
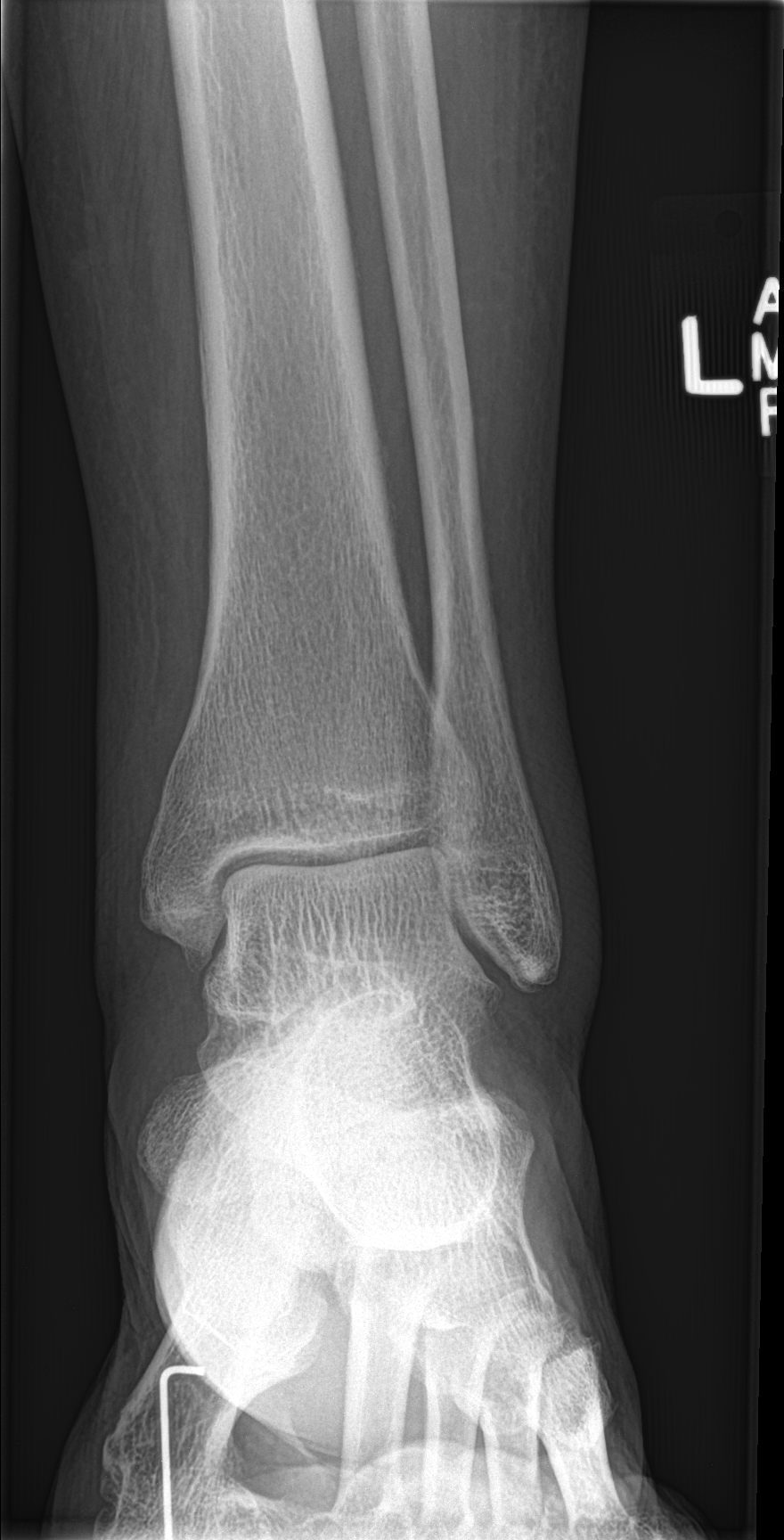

[ankle obl]
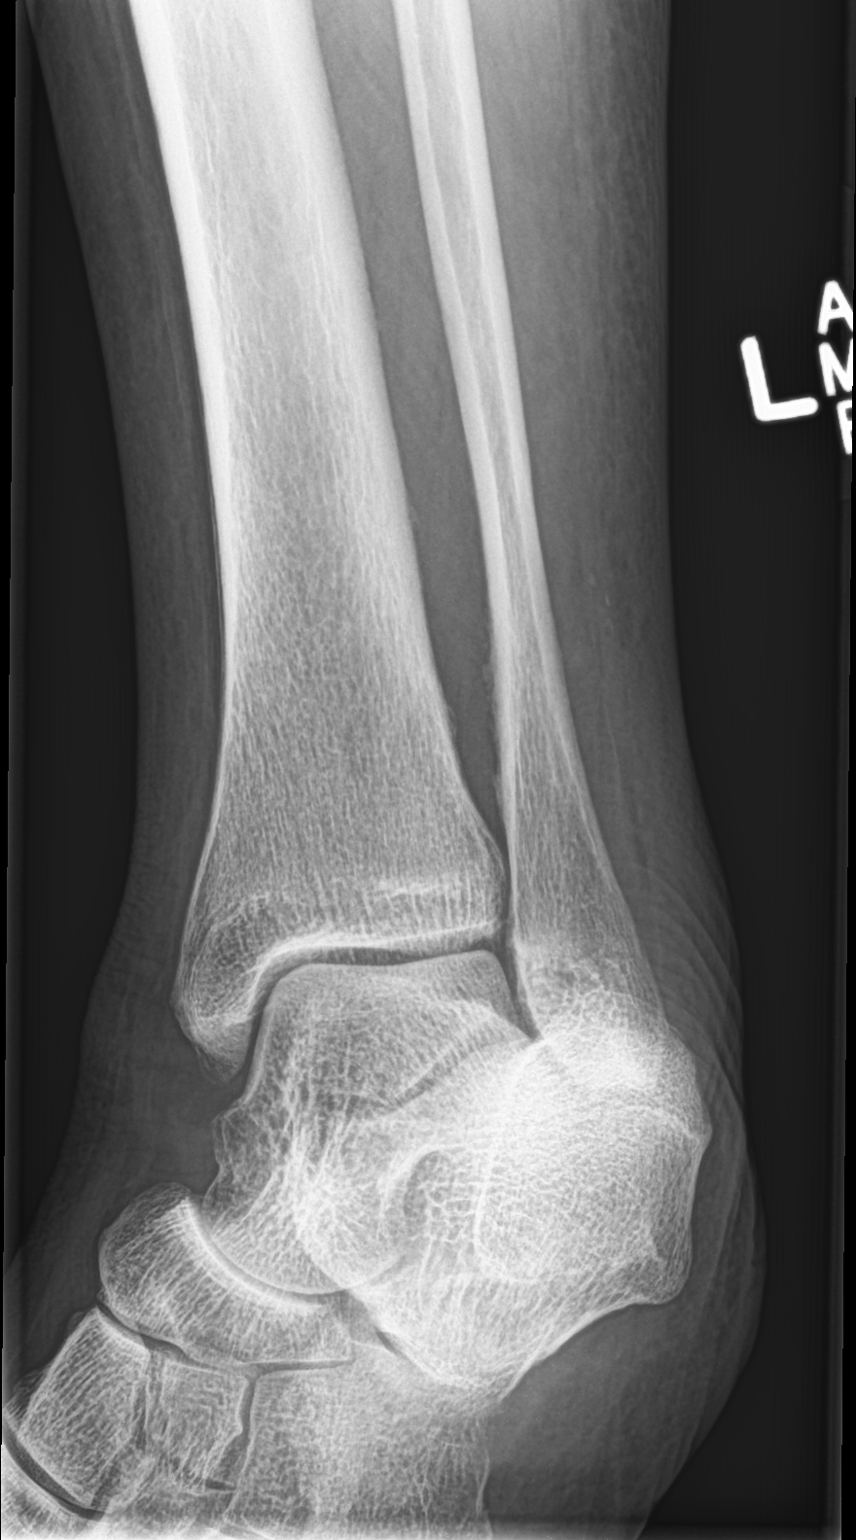

[ankle lat]
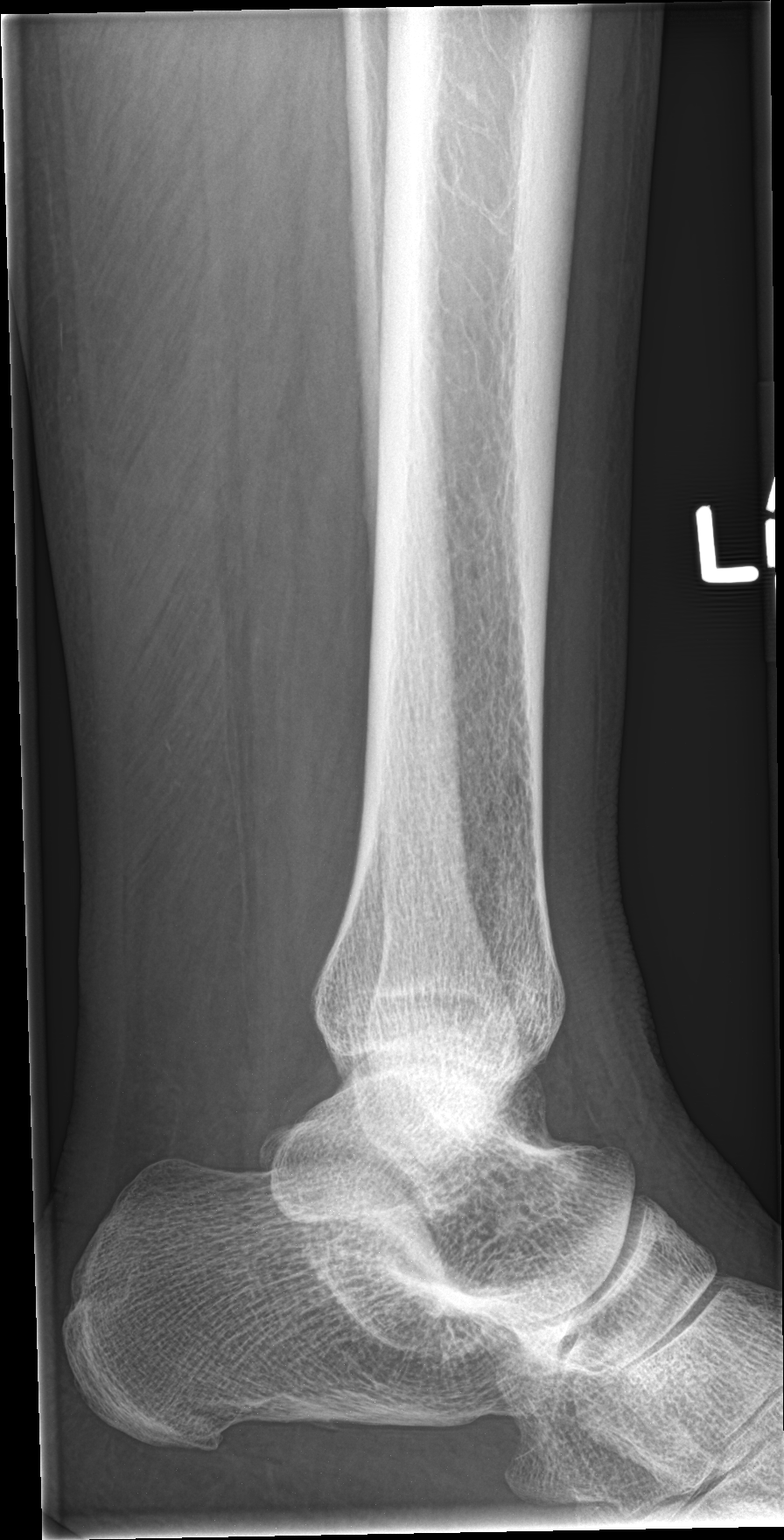

[3 of 3 positions shown; findings below may reference images not displayed]

FINDINGS: There is no evidence of fracture, dislocation, or joint effusion.
There is no evidence of arthropathy or other focal bone abnormality.
Soft tissues are unremarkable.
IMPRESSION: Normal left ankle.

## 2017-06-16 DIAGNOSIS — Z0001 Encounter for general adult medical examination with abnormal findings: Secondary | ICD-10-CM | POA: Diagnosis not present

## 2017-06-16 DIAGNOSIS — E559 Vitamin D deficiency, unspecified: Secondary | ICD-10-CM | POA: Diagnosis not present

## 2017-06-16 DIAGNOSIS — I10 Essential (primary) hypertension: Secondary | ICD-10-CM | POA: Diagnosis not present

## 2017-06-16 DIAGNOSIS — R000 Tachycardia, unspecified: Secondary | ICD-10-CM | POA: Diagnosis not present

## 2017-07-05 DIAGNOSIS — Z0001 Encounter for general adult medical examination with abnormal findings: Secondary | ICD-10-CM | POA: Diagnosis not present

## 2017-07-05 DIAGNOSIS — M159 Polyosteoarthritis, unspecified: Secondary | ICD-10-CM | POA: Diagnosis not present

## 2017-07-05 DIAGNOSIS — Z1389 Encounter for screening for other disorder: Secondary | ICD-10-CM | POA: Diagnosis not present

## 2017-07-05 DIAGNOSIS — E782 Mixed hyperlipidemia: Secondary | ICD-10-CM | POA: Diagnosis not present

## 2017-08-01 DIAGNOSIS — M79671 Pain in right foot: Secondary | ICD-10-CM | POA: Diagnosis not present

## 2017-08-01 DIAGNOSIS — M722 Plantar fascial fibromatosis: Secondary | ICD-10-CM | POA: Diagnosis not present

## 2017-08-01 DIAGNOSIS — M25579 Pain in unspecified ankle and joints of unspecified foot: Secondary | ICD-10-CM | POA: Diagnosis not present

## 2017-08-01 DIAGNOSIS — M79672 Pain in left foot: Secondary | ICD-10-CM | POA: Diagnosis not present

## 2017-08-02 ENCOUNTER — Ambulatory Visit (INDEPENDENT_AMBULATORY_CARE_PROVIDER_SITE_OTHER): Payer: Medicare Other

## 2017-08-02 ENCOUNTER — Encounter: Payer: Self-pay | Admitting: Orthopedic Surgery

## 2017-08-02 ENCOUNTER — Ambulatory Visit: Payer: Medicare Other | Admitting: Orthopedic Surgery

## 2017-08-02 VITALS — BP 124/77 | HR 48 | Ht 66.0 in | Wt 200.0 lb

## 2017-08-02 DIAGNOSIS — Z96651 Presence of right artificial knee joint: Secondary | ICD-10-CM

## 2017-08-02 DIAGNOSIS — M1712 Unilateral primary osteoarthritis, left knee: Secondary | ICD-10-CM | POA: Diagnosis not present

## 2017-08-02 NOTE — Progress Notes (Signed)
Progress Note   Patient ID: Leslie Barrera, female   DOB: 27-Dec-1940, 76 y.o.   MRN: [id number]  Chief Complaint  Patient presents with  . Knee Pain    left knee    77 year old female status post right total knee arthroplasty complicated by right foot drop which resolved presents back for evaluation of left knee with severe valgus deformity.  She says the left knee is not hurting and she has no trouble with her activities of daily living     Review of Systems  Musculoskeletal: Negative for joint pain.  Neurological: Negative for tingling and focal weakness.   Current Meds  Medication Sig  . aspirin EC 81 MG tablet Take 81 mg by mouth daily.    . bisoprolol-hydrochlorothiazide (ZIAC) 10-6.25 MG per tablet Take 1 tablet by mouth daily.   . calcium-vitamin D (OSCAL WITH D) 500-200 MG-UNIT per tablet Take 1 tablet by mouth daily.    . furosemide (LASIX) 40 MG tablet Take 40 mg by mouth daily.   . simvastatin (ZOCOR) 40 MG tablet Take 40 mg by mouth at bedtime.      Allergies  Allergen Reactions  . Tetracycline Rash     BP 124/77   Pulse (!) 48   Ht 5\' 6"  (1.676 m)   Wt 200 lb (90.7 kg)   BMI 32.28 kg/m   Physical Exam  Constitutional: She is oriented to person, place, and time. She appears well-developed and well-nourished.  Musculoskeletal:       Legs: Neurological: She is alert and oriented to person, place, and time. Gait normal.  Psychiatric: She has a normal mood and affect. Judgment normal.  Vitals reviewed.    Medical decision-making Encounter Diagnoses  Name Primary?  . Status post total right knee replacement 12/08/09 Yes  . Arthritis of knee, left     We discussed the possibility of a knee replacement when she becomes more painful or if on x-ray I see too much bone loss in the lateral compartment.  She will come back in 6 months we will repeat the x-ray then.  If she has any increase in pain or painful episodes she will call for appointment  sooner  Today's x-ray shows 24 degree tibial femoral valgus alignment of the knee with cyst formation joint space narrowing and osteophytes primarily in the lateral compartment     Arther Abbott, MD 08/02/2017 9:25 AM

## 2017-10-20 DIAGNOSIS — H00013 Hordeolum externum right eye, unspecified eyelid: Secondary | ICD-10-CM | POA: Diagnosis not present

## 2017-10-20 DIAGNOSIS — R7309 Other abnormal glucose: Secondary | ICD-10-CM | POA: Diagnosis not present

## 2017-10-20 DIAGNOSIS — Z1389 Encounter for screening for other disorder: Secondary | ICD-10-CM | POA: Diagnosis not present

## 2018-01-04 DIAGNOSIS — H25813 Combined forms of age-related cataract, bilateral: Secondary | ICD-10-CM | POA: Diagnosis not present

## 2018-01-04 DIAGNOSIS — H35462 Secondary vitreoretinal degeneration, left eye: Secondary | ICD-10-CM | POA: Diagnosis not present

## 2018-01-29 ENCOUNTER — Other Ambulatory Visit (HOSPITAL_COMMUNITY): Payer: Self-pay | Admitting: Family Medicine

## 2018-01-29 ENCOUNTER — Ambulatory Visit (HOSPITAL_COMMUNITY)
Admission: RE | Admit: 2018-01-29 | Discharge: 2018-01-29 | Disposition: A | Discharge status: Home / Self Care | Payer: Medicare Other | Source: Ambulatory Visit | Attending: Family Medicine | Admitting: Family Medicine

## 2018-01-29 DIAGNOSIS — M7989 Other specified soft tissue disorders: Secondary | ICD-10-CM | POA: Insufficient documentation

## 2018-01-29 DIAGNOSIS — M25572 Pain in left ankle and joints of left foot: Secondary | ICD-10-CM | POA: Insufficient documentation

## 2018-01-29 DIAGNOSIS — M79605 Pain in left leg: Secondary | ICD-10-CM

## 2018-01-29 DIAGNOSIS — M25472 Effusion, left ankle: Secondary | ICD-10-CM | POA: Diagnosis not present

## 2018-01-29 DIAGNOSIS — Z1389 Encounter for screening for other disorder: Secondary | ICD-10-CM | POA: Diagnosis not present

## 2018-01-29 IMAGING — DX DG ANKLE COMPLETE 3+V*L*
3 series · 3 of 3 positions shown · non-contrast
Comparison: Left ankle radiographs-[DATE]

CLINICAL DATA: Pain and swelling involving the left ankle. No known
injury.

EXAM:
LEFT ANKLE COMPLETE - 3+ VIEW

[ankle ap]
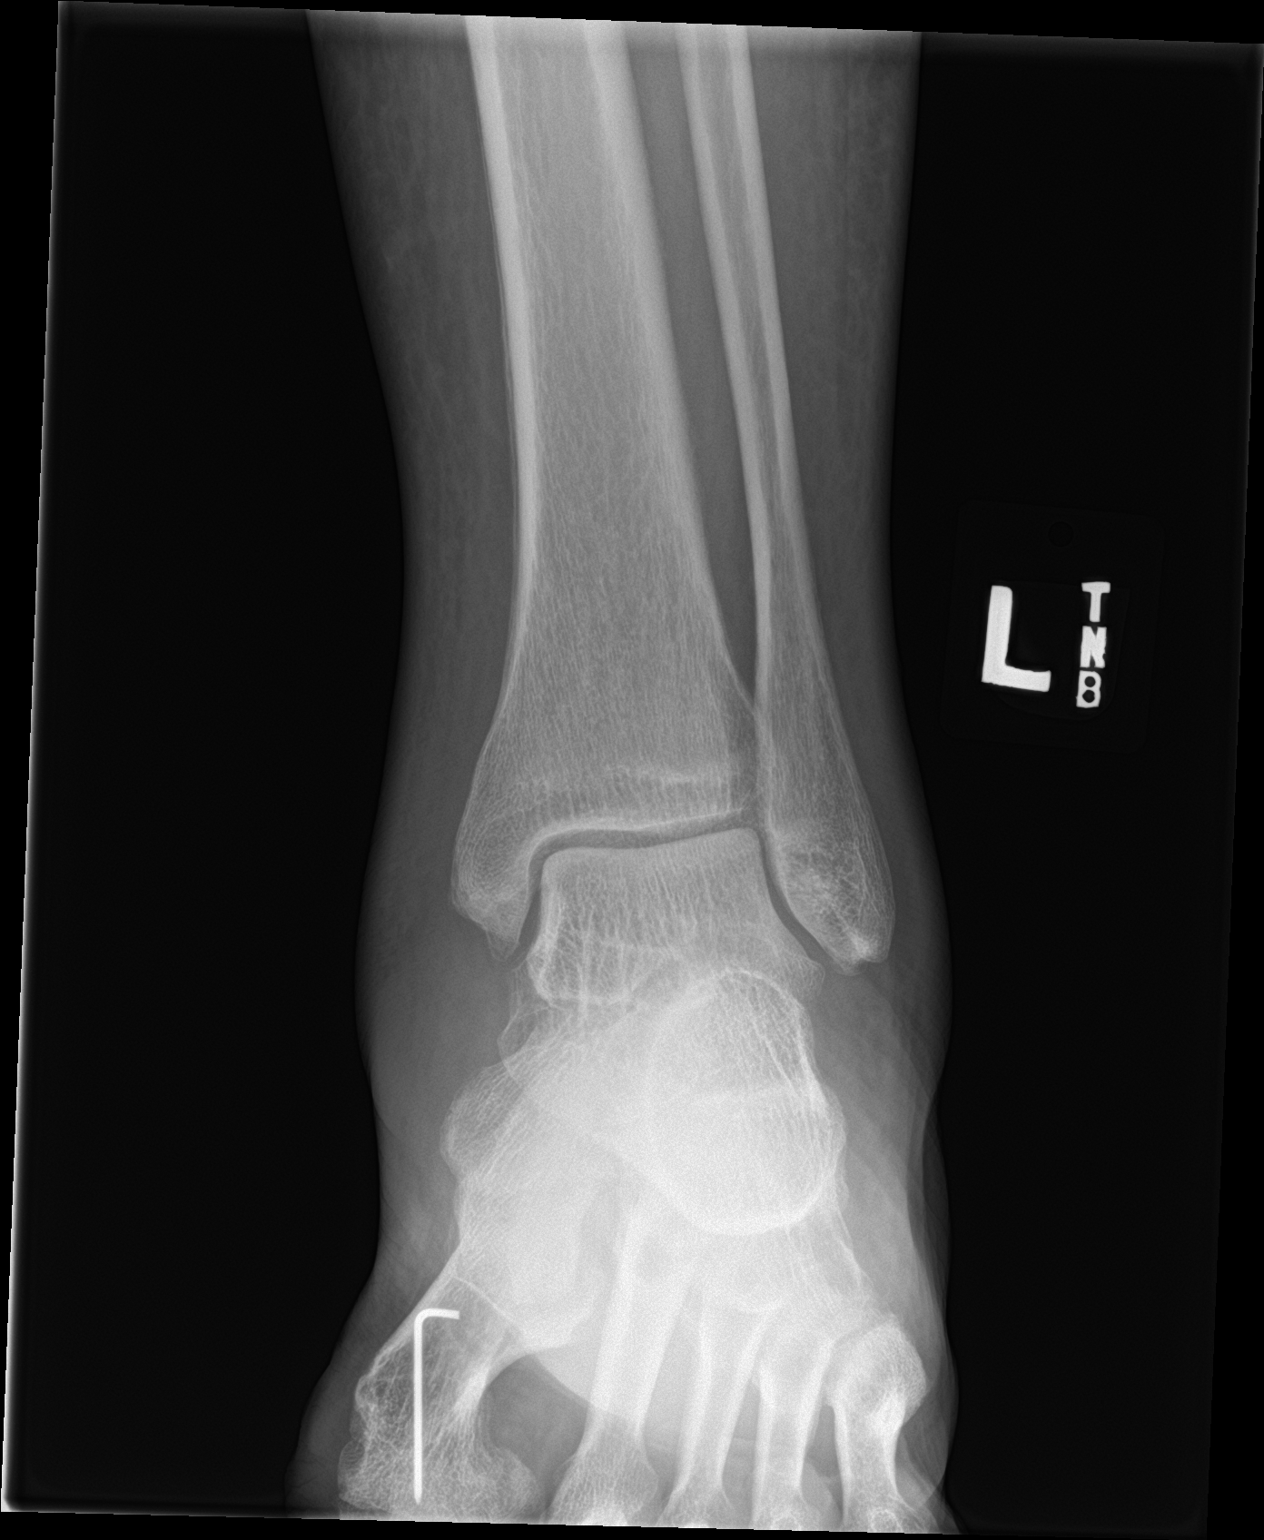

[ankle obl]
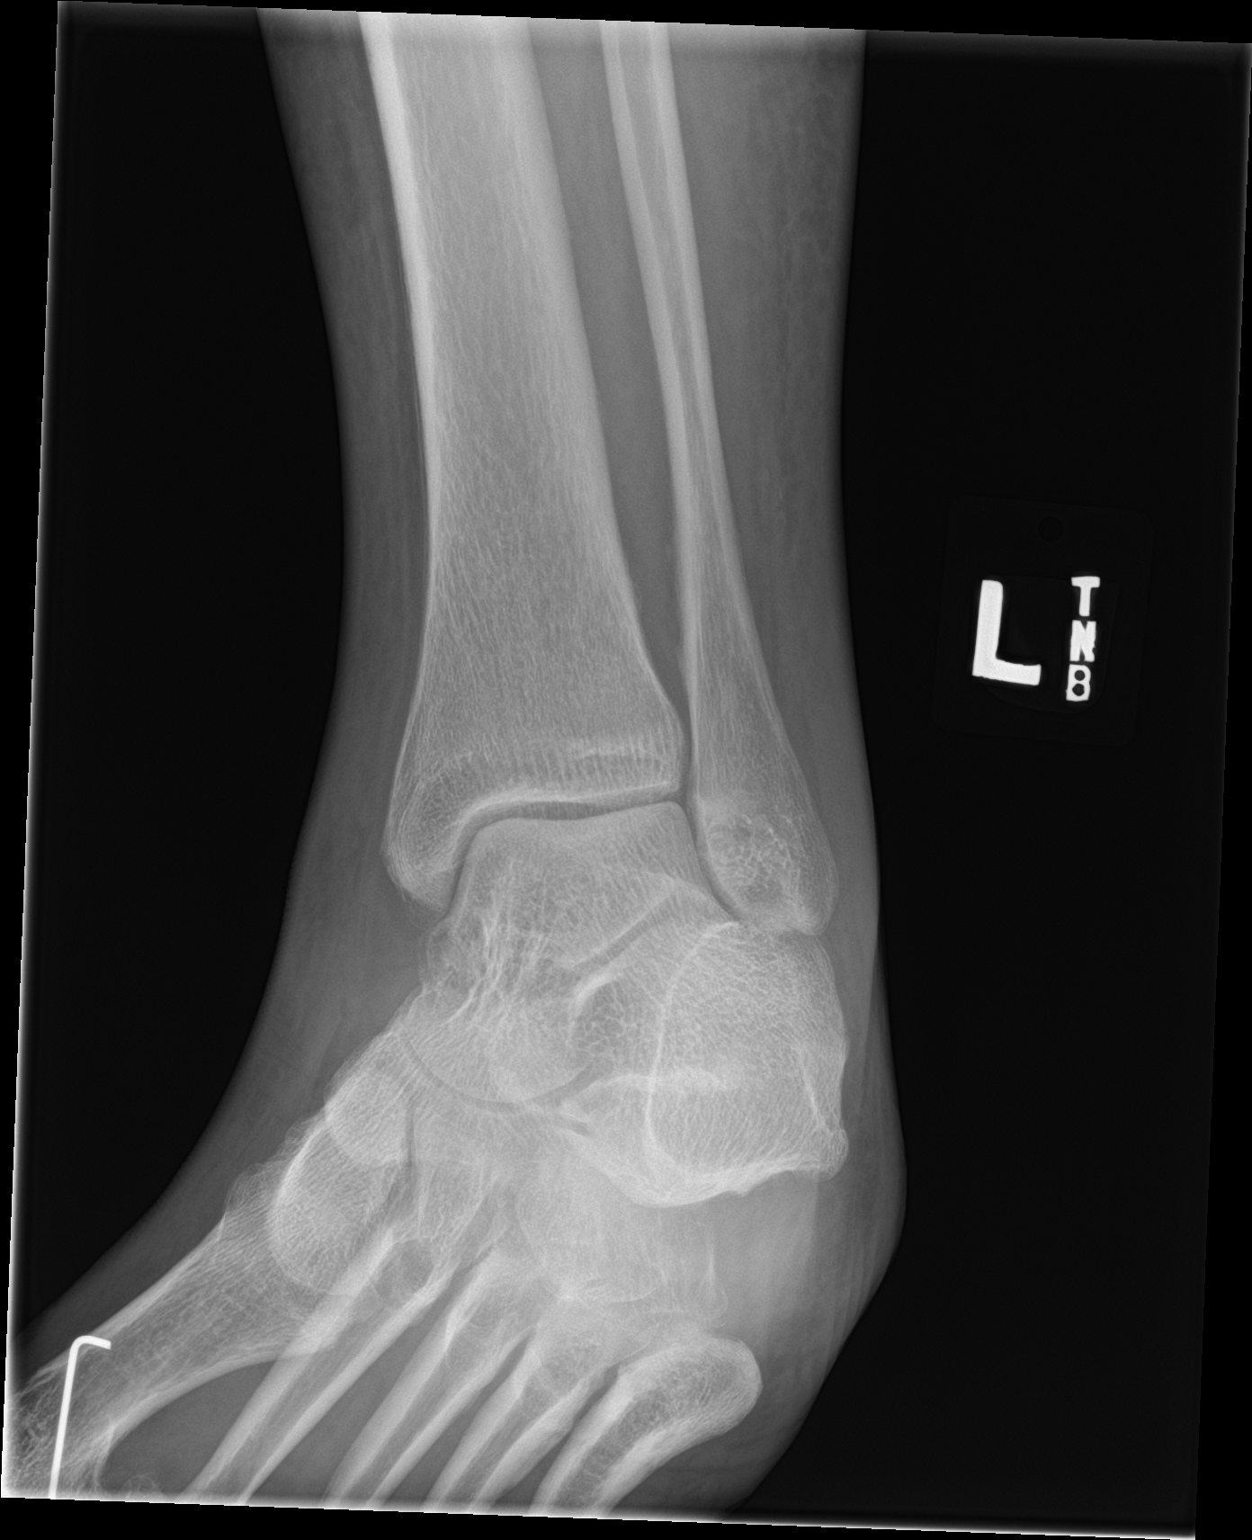

[ankle lat]
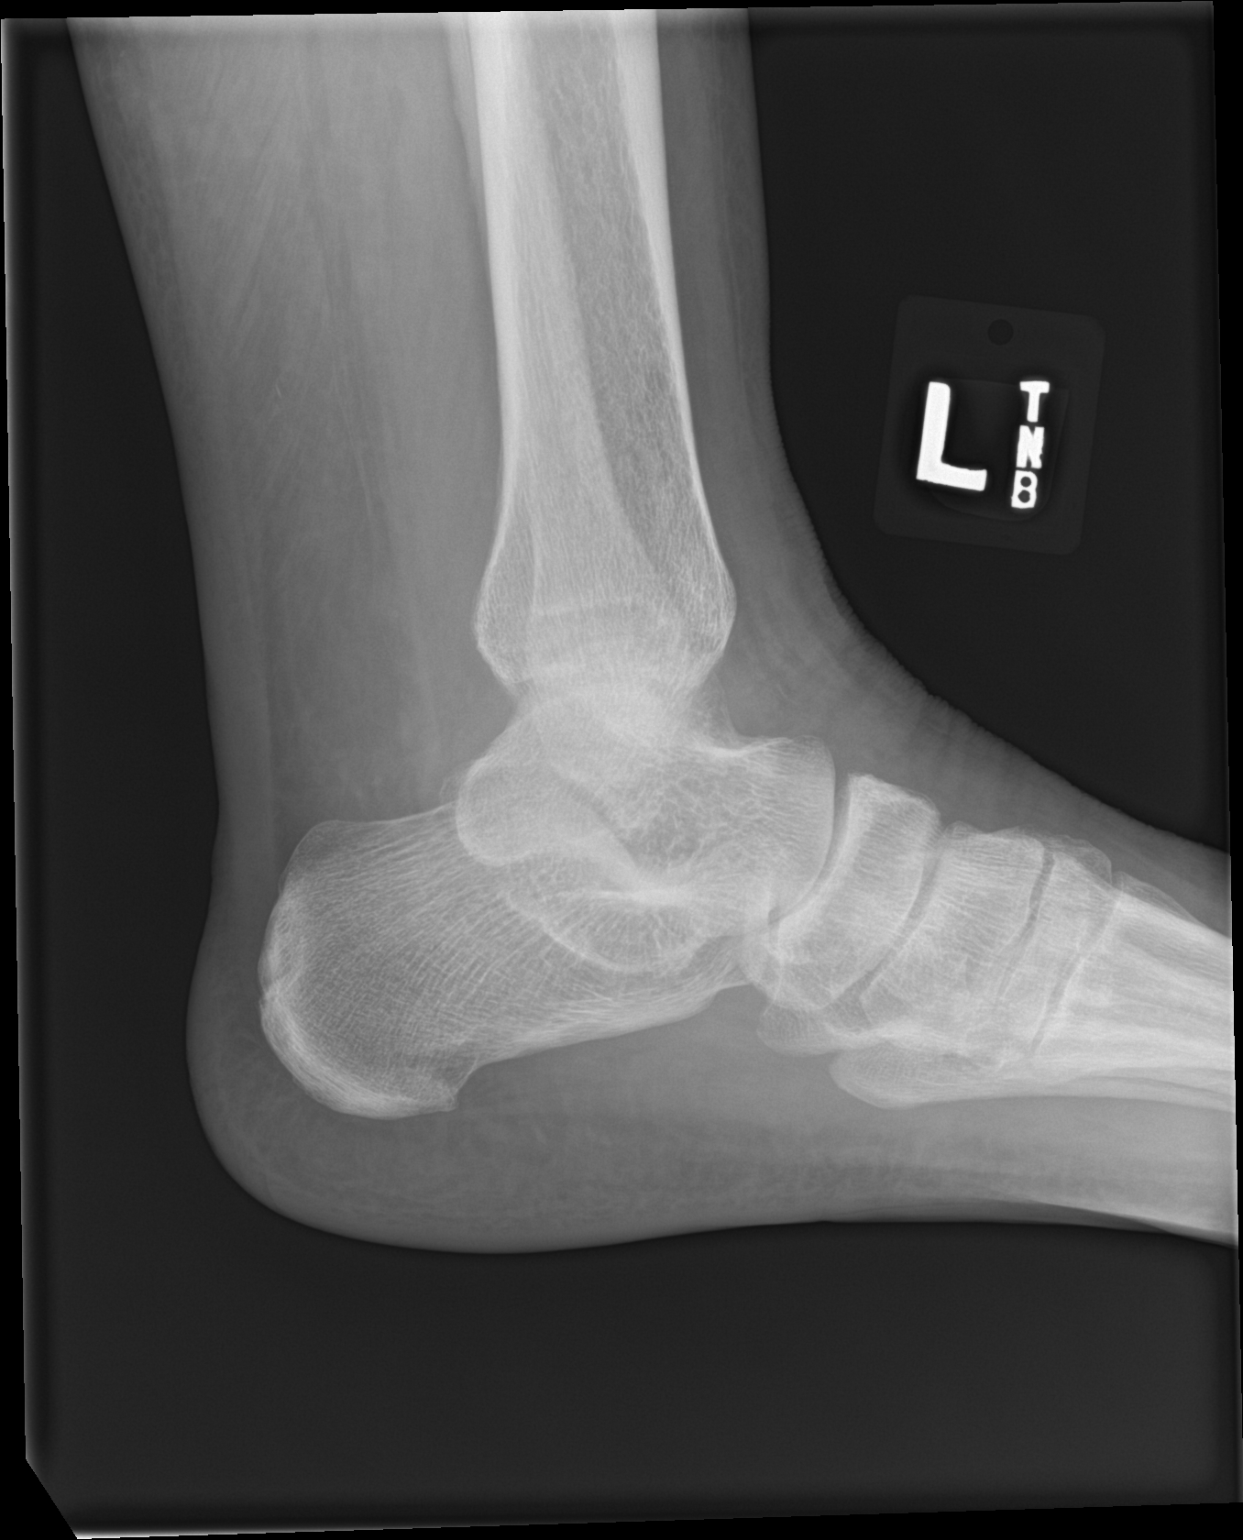

[3 of 3 positions shown; findings below may reference images not displayed]

FINDINGS: There is apparent diffuse soft tissue swelling about the lower leg,
most conspicuous about the medial malleolus. No associated fracture
or dislocation. Potential small ankle joint effusion. Tiny plantar
calcaneal spur. Pin fixation of the distal aspect of the first
metatarsal, incompletely imaged. Minimal enthesopathic change
involving the Achilles tendon insertion site.
IMPRESSION: Nonspecific diffuse soft tissue swelling about the lower leg and
potential small ankle joint effusion without associated fracture or
dislocation.

## 2018-01-31 ENCOUNTER — Ambulatory Visit: Payer: Self-pay | Admitting: Orthopedic Surgery

## 2018-02-07 ENCOUNTER — Ambulatory Visit: Payer: Self-pay | Admitting: Orthopedic Surgery

## 2018-02-14 ENCOUNTER — Ambulatory Visit (INDEPENDENT_AMBULATORY_CARE_PROVIDER_SITE_OTHER): Payer: Medicare Other

## 2018-02-14 ENCOUNTER — Encounter: Payer: Self-pay | Admitting: Orthopedic Surgery

## 2018-02-14 ENCOUNTER — Ambulatory Visit: Payer: Medicare Other | Admitting: Orthopedic Surgery

## 2018-02-14 VITALS — BP 172/74 | HR 49 | Ht 66.5 in | Wt 194.0 lb

## 2018-02-14 DIAGNOSIS — M1710 Unilateral primary osteoarthritis, unspecified knee: Secondary | ICD-10-CM | POA: Diagnosis not present

## 2018-02-14 DIAGNOSIS — G8929 Other chronic pain: Secondary | ICD-10-CM

## 2018-02-14 DIAGNOSIS — M76822 Posterior tibial tendinitis, left leg: Secondary | ICD-10-CM

## 2018-02-14 DIAGNOSIS — M1712 Unilateral primary osteoarthritis, left knee: Secondary | ICD-10-CM | POA: Diagnosis not present

## 2018-02-14 DIAGNOSIS — M25572 Pain in left ankle and joints of left foot: Secondary | ICD-10-CM

## 2018-02-14 NOTE — Progress Notes (Signed)
Chief Complaint  Patient presents with  . Ankle Pain    left  . Knee Pain    left     77 year old female presents with 2 complaints 1 left ankle pain and 1 chronic left knee pain  The patient reports no trauma to the left ankle but is noticed over the last 2 to 3 weeks increasing medial and lateral ankle pain swelling painful weightbearing decreased range of motion  She is also having some increased pain in the knee she has a history of chronic arthritis of the left knee with valgus deformity which is being followed symptomatically    Review of Systems  Eyes:       Blurred vision with cataract surgery pending  Musculoskeletal: Negative for back pain.  Neurological: Negative for tingling.   Past Medical History:  Diagnosis Date  . High cholesterol   . HTN (hypertension)     Past Surgical History:  Procedure Laterality Date  . COLONOSCOPY N/A 04/07/2015   Procedure: COLONOSCOPY;  Surgeon: Aviva Signs, MD;  Location: [facility];  Service: Gastroenterology;  Laterality: N/A;  . left knee arthroscopy  2006   [name]  . left-great toe bunion    . Small toe surgery    . TOTAL KNEE ARTHROPLASTY Right 2009  . VESICOVAGINAL FISTULA CLOSURE W/ TAH     BP (!) 172/74   Pulse (!) 49   Ht 5' 6.5" (1.689 m)   Wt 194 lb (88 kg)   BMI 30.84 kg/m  Physical Exam  Constitutional: She is oriented to person, place, and time. She appears well-developed and well-nourished.  Neurological: She is alert and oriented to person, place, and time.  Psychiatric: She has a normal mood and affect. Judgment normal.  Vitals reviewed.  Left knee moderately severe valgus mild lateral compartment tenderness.  5 degree flexion contracture 90 degrees of flexion pseudolaxity in the collateral ligaments AP laxity normal strength normal skin warm dry and intact no rash lesion ulceration.  Distal pulses normal no peripheral edema  Normal sensation in the foot  Left ankle moderate flatfoot subtalar  motion is normal ankle is stable tenderness in the lateral gutter and posterior tibial tendon with swelling around the tendon  Labored gait  X-ray ankle shows no fracture dislocation or bony abnormality  Please see dictated report  X-ray left knee shows moderate valgus deformity severe arthritis in the patellofemoral and lateral compartment  Recommend Spenco foot orthotics Epson salt soaks for the left ankle  She will schedule total knee replacement on the left at her convenience  Encounter Diagnoses  Name Primary?  Marland Kitchen Arthritis of knee Yes  . Chronic pain of left ankle   . Posterior tibial tendon dysfunction (PTTD) of left lower extremity

## 2018-02-14 NOTE — Patient Instructions (Signed)
Epsom salt soaks nightly x 20 min

## 2018-02-15 DIAGNOSIS — H25813 Combined forms of age-related cataract, bilateral: Secondary | ICD-10-CM | POA: Diagnosis not present

## 2018-03-19 DIAGNOSIS — Z23 Encounter for immunization: Secondary | ICD-10-CM | POA: Diagnosis not present

## 2018-04-26 DIAGNOSIS — Z0001 Encounter for general adult medical examination with abnormal findings: Secondary | ICD-10-CM | POA: Diagnosis not present

## 2018-04-26 DIAGNOSIS — M25571 Pain in right ankle and joints of right foot: Secondary | ICD-10-CM | POA: Diagnosis not present

## 2018-04-26 DIAGNOSIS — Z1389 Encounter for screening for other disorder: Secondary | ICD-10-CM | POA: Diagnosis not present

## 2018-05-08 ENCOUNTER — Emergency Department (HOSPITAL_COMMUNITY)
Admission: EM | Admit: 2018-05-08 | Discharge: 2018-05-08 | Disposition: A | Discharge status: Home / Self Care | Payer: Medicare Other | Attending: Emergency Medicine | Admitting: Emergency Medicine

## 2018-05-08 ENCOUNTER — Encounter (HOSPITAL_COMMUNITY): Payer: Self-pay | Admitting: Emergency Medicine

## 2018-05-08 ENCOUNTER — Other Ambulatory Visit: Payer: Self-pay

## 2018-05-08 DIAGNOSIS — N816 Rectocele: Secondary | ICD-10-CM | POA: Insufficient documentation

## 2018-05-08 DIAGNOSIS — Z7982 Long term (current) use of aspirin: Secondary | ICD-10-CM | POA: Insufficient documentation

## 2018-05-08 DIAGNOSIS — I10 Essential (primary) hypertension: Secondary | ICD-10-CM | POA: Insufficient documentation

## 2018-05-08 DIAGNOSIS — Z96651 Presence of right artificial knee joint: Secondary | ICD-10-CM | POA: Diagnosis not present

## 2018-05-08 DIAGNOSIS — N898 Other specified noninflammatory disorders of vagina: Secondary | ICD-10-CM | POA: Diagnosis present

## 2018-05-08 DIAGNOSIS — Z79899 Other long term (current) drug therapy: Secondary | ICD-10-CM | POA: Diagnosis not present

## 2018-05-08 LAB — CBC WITH DIFFERENTIAL/PLATELET
ABS IMMATURE GRANULOCYTES: 0.01 10*3/uL (ref 0.00–0.07)
Basophils Absolute: 0 10*3/uL (ref 0.0–0.1)
Basophils Relative: 1 %
Eosinophils Absolute: 0.1 10*3/uL (ref 0.0–0.5)
Eosinophils Relative: 1 %
HEMATOCRIT: 40.8 % (ref 36.0–46.0)
HEMOGLOBIN: 13 g/dL (ref 12.0–15.0)
Immature Granulocytes: 0 %
LYMPHS ABS: 1.7 10*3/uL (ref 0.7–4.0)
LYMPHS PCT: 19 %
MCH: 27.7 pg (ref 26.0–34.0)
MCHC: 31.9 g/dL (ref 30.0–36.0)
MCV: 87 fL (ref 80.0–100.0)
MONO ABS: 0.7 10*3/uL (ref 0.1–1.0)
MONOS PCT: 8 %
NEUTROS ABS: 6.1 10*3/uL (ref 1.7–7.7)
Neutrophils Relative %: 71 %
Platelets: 171 10*3/uL (ref 150–400)
RBC: 4.69 MIL/uL (ref 3.87–5.11)
RDW: 15.4 % (ref 11.5–15.5)
WBC: 8.6 10*3/uL (ref 4.0–10.5)
nRBC: 0 % (ref 0.0–0.2)

## 2018-05-08 LAB — POC OCCULT BLOOD, ED: FECAL OCCULT BLD: NEGATIVE

## 2018-05-08 LAB — COMPREHENSIVE METABOLIC PANEL
ALK PHOS: 54 U/L (ref 38–126)
ALT: 17 U/L (ref 0–44)
AST: 24 U/L (ref 15–41)
Albumin: 3.5 g/dL (ref 3.5–5.0)
Anion gap: 5 (ref 5–15)
BUN: 14 mg/dL (ref 8–23)
CALCIUM: 8.8 mg/dL — AB (ref 8.9–10.3)
CO2: 28 mmol/L (ref 22–32)
CREATININE: 0.55 mg/dL (ref 0.44–1.00)
Chloride: 105 mmol/L (ref 98–111)
Glucose, Bld: 88 mg/dL (ref 70–99)
Potassium: 3.8 mmol/L (ref 3.5–5.1)
Sodium: 138 mmol/L (ref 135–145)
Total Bilirubin: 0.5 mg/dL (ref 0.3–1.2)
Total Protein: 7.6 g/dL (ref 6.5–8.1)

## 2018-05-08 MED ORDER — HYDROCORTISONE 2.5 % RE CREA: TOPICAL_CREAM | RECTAL | 0 refills | Status: DC

## 2018-05-08 NOTE — ED Notes (Signed)
Patient given discharge instruction, verbalized understand. Patient ambulatory out of the department.  

## 2018-05-08 NOTE — Discharge Instructions (Signed)
See Dr. Elonda Husky as scheduled

## 2018-05-08 NOTE — ED Triage Notes (Addendum)
Pt states she has had some vaginal bleeding that began on Sunday. Pt denies pain. Pt has an appt with Dr. Elonda Husky on Nov 21st.

## 2018-05-08 NOTE — ED Provider Notes (Signed)
[facility] EMERGENCY DEPARTMENT Provider Note   CSN: [id number] Arrival date & time: 05/08/18  6269     History   Chief Complaint Chief Complaint  Patient presents with  . Vaginal Discharge    HPI Leslie Barrera is a 77 y.o. female.  The history is provided by the patient. No language interpreter was used.  Vaginal Discharge   This is a new problem. The current episode started yesterday. The problem has not changed since onset.The discharge was normal. Pertinent negatives include no fever, no nausea and no vomiting. She has tried nothing for the symptoms. The treatment provided no relief.  Pt complains of vaginal bleeding.  Pt reports she thinks her uterus has dropped out. Pt has an appointment to see Dr. Elonda Husky  Pt reports stool is normal, no diarrhea or vomiting,  Pt denies any black stool.  Pt reports she has had blood in her underwear  Past Medical History:  Diagnosis Date  . High cholesterol   . HTN (hypertension)     Patient Active Problem List   Diagnosis Date Noted  . Status post total right knee replacement 12/08/09 01/14/2014  . INGROWN NAIL 08/10/2010  . FOOT DROP, RIGHT 12/22/2009  . TOTAL KNEE FOLLOW-UP 12/22/2009  . ARTHRITIS, KNEES, BILATERAL 07/29/2009    Past Surgical History:  Procedure Laterality Date  . COLONOSCOPY N/A 04/07/2015   Procedure: COLONOSCOPY;  Surgeon: Aviva Signs, MD;  Location: [facility];  Service: Gastroenterology;  Laterality: N/A;  . left knee arthroscopy  2006   [facility]  . left-great toe bunion    . Small toe surgery    . TOTAL KNEE ARTHROPLASTY Right 2009  . VESICOVAGINAL FISTULA CLOSURE W/ TAH       OB History   None      Home Medications    Prior to Admission medications   Medication Sig Start Date End Date Taking? Authorizing Provider  aspirin EC 81 MG tablet Take 81 mg by mouth daily.     Yes [provider]  bisoprolol-hydrochlorothiazide (ZIAC) 10-6.25 MG per tablet Take 1 tablet by mouth  daily.  11/12/10  Yes [provider]  cholecalciferol (VITAMIN D) 1000 units tablet Take 1,000 Units by mouth daily.   Yes [provider]  furosemide (LASIX) 40 MG tablet Take 40 mg by mouth daily.  11/25/10  Yes [provider]  simvastatin (ZOCOR) 40 MG tablet Take 40 mg by mouth at bedtime.     Yes [provider]    Family History No family history on file.  Social History Social History   Tobacco Use  . Smoking status: Never Smoker  . Smokeless tobacco: Never Used  Substance Use Topics  . Alcohol use: No  . Drug use: No     Allergies   Tetracycline   Review of Systems Review of Systems  Constitutional: Negative for fever.  Gastrointestinal: Negative for nausea and vomiting.  Genitourinary: Positive for vaginal discharge.     Physical Exam Updated Vital Signs BP (!) 153/73   Pulse (!) 57   Temp 97.9 F (36.6 C) (Oral)   Resp 20   Ht 5\' 6"  (1.676 m)   Wt 88 kg   SpO2 97%   BMI 31.31 kg/m   Physical Exam  Constitutional: She is oriented to person, place, and time. She appears well-developed and well-nourished.  HENT:  Head: Normocephalic.  Right Ear: External ear normal.  Left Ear: External ear normal.  Mouth/Throat: Oropharynx is clear and  moist.  Eyes: Pupils are equal, round, and reactive to light. EOM are normal.  Neck: Normal range of motion.  Cardiovascular: Normal rate and regular rhythm.  Pulmonary/Chest: Effort normal.  Abdominal: She exhibits no distension.  Genitourinary: Vagina normal.  Genitourinary Comments: Pt has a prolapse of tissue, posterior,  There is a small ulcer that is 3x107mm, scabbed,  Brown stool hemocult negative   Musculoskeletal: Normal range of motion.  Neurological: She is alert and oriented to person, place, and time.  Skin: Skin is warm.  Psychiatric: She has a normal mood and affect.  Nursing note and vitals reviewed.    ED Treatments / Results  Labs (all labs ordered are  listed, but only abnormal results are displayed) Labs Reviewed  COMPREHENSIVE METABOLIC PANEL - Abnormal; Notable for the following components:      Result Value   Calcium 8.8 (*)    All other components within normal limits  CBC WITH DIFFERENTIAL/PLATELET  POC OCCULT BLOOD, ED    EKG None  Radiology No results found.  Procedures Procedures (including critical care time)  Medications Ordered in ED Medications - No data to display   Initial Impression / Assessment and Plan / ED Course  I have reviewed the triage vital signs and the nursing notes.  Pertinent labs & imaging results that were available during my care of the patient were reviewed by me and considered in my medical decision making (see chart for details).     MDM    Final Clinical Impressions(s) / ED Diagnoses   Final diagnoses:  Rectocele    ED Discharge Orders    None    An After Visit Summary was printed and given to the patient.    Fransico Meadow, [place] 05/08/18 1102    Milton Ferguson, MD 05/08/18 1423

## 2018-05-17 ENCOUNTER — Ambulatory Visit: Payer: Medicare Other | Admitting: Obstetrics & Gynecology

## 2018-05-17 ENCOUNTER — Encounter: Payer: Self-pay | Admitting: Obstetrics & Gynecology

## 2018-05-17 VITALS — BP 139/70 | HR 61 | Ht 66.5 in | Wt 192.0 lb

## 2018-05-17 DIAGNOSIS — N993 Prolapse of vaginal vault after hysterectomy: Secondary | ICD-10-CM

## 2018-05-17 MED ORDER — POLYETHYLENE GLYCOL 3350 17 GM/SCOOP PO POWD: ORAL | 11 refills | Status: DC

## 2018-05-17 NOTE — Patient Instructions (Signed)
MIRALAX POWDER

## 2018-05-17 NOTE — Progress Notes (Signed)
Chief Complaint  Patient presents with  . something has dropped      77 y.o. G1P1001 No LMP recorded. Patient has had a hysterectomy. The current method of family planning is status post hysterectomy.  Outpatient Encounter Medications as of 05/17/2018  Medication Sig  . aspirin EC 81 MG tablet Take 81 mg by mouth daily.    . bisoprolol-hydrochlorothiazide (ZIAC) 10-6.25 MG per tablet Take 1 tablet by mouth daily.   . Cholecalciferol (VITAMIN D3 PO) Take 2,000 Units by mouth daily.  . furosemide (LASIX) 40 MG tablet Take 40 mg by mouth daily.   . simvastatin (ZOCOR) 40 MG tablet Take 40 mg by mouth at bedtime.    . [DISCONTINUED] cholecalciferol (VITAMIN D) 1000 units tablet Take 1,000 Units by mouth daily.  . [DISCONTINUED] hydrocortisone (ANUSOL-HC) 2.5 % rectal cream Apply to prolapsed area and bleeding area twice a day for 3 days   No facility-administered encounter medications on file as of 05/17/2018.     Subjective Pt states about 2 months ago noticed something had fallen out No pain or pressure No urine loss No bleeding Some constipation Past Medical History:  Diagnosis Date  . High cholesterol   . HTN (hypertension)   . Vitamin D deficiency     Past Surgical History:  Procedure Laterality Date  . COLONOSCOPY N/A 04/07/2015   Procedure: COLONOSCOPY;  Surgeon: Aviva Signs, MD;  Location: [facility];  Service: Gastroenterology;  Laterality: N/A;  . left knee arthroscopy  2006   [facility]  . left-great toe bunion    . Small toe surgery    . TOTAL KNEE ARTHROPLASTY Right 2009  . VESICOVAGINAL FISTULA CLOSURE W/ TAH      OB History    Gravida  1   Para  1   Term  1   Preterm      AB      Living  1     SAB      TAB      Ectopic      Multiple      Live Births  1           Allergies  Allergen Reactions  . Tetracycline Rash    Social History   Socioeconomic History  . Marital status: Married    Spouse name: Not on file    . Number of children: Not on file  . Years of education: Not on file  . Highest education level: Not on file  Occupational History  . Not on file  Social Needs  . Financial resource strain: Not on file  . Food insecurity:    Worry: Not on file    Inability: Not on file  . Transportation needs:    Medical: Not on file    Non-medical: Not on file  Tobacco Use  . Smoking status: Never Smoker  . Smokeless tobacco: Never Used  Substance and Sexual Activity  . Alcohol use: No  . Drug use: No  . Sexual activity: Not Currently    Birth control/protection: Surgical    Comment: hyst  Lifestyle  . Physical activity:    Days per week: Not on file    Minutes per session: Not on file  . Stress: Not on file  Relationships  . Social connections:    Talks on phone: Not on file    Gets together: Not on file    Attends religious service: Not on file    Active member  of club or organization: Not on file    Attends meetings of clubs or organizations: Not on file    Relationship status: Not on file  Other Topics Concern  . Not on file  Social History Narrative  . Not on file    Family History  Problem Relation Age of Onset  . Alzheimer's disease Father   . Other Father        hardening of the arteries  . Heart disease Mother   . Heart attack Brother   . Alzheimer's disease Sister     Medications:       Current Outpatient Medications:  .  aspirin EC 81 MG tablet, Take 81 mg by mouth daily.  , Disp: , Rfl:  .  bisoprolol-hydrochlorothiazide (ZIAC) 10-6.25 MG per tablet, Take 1 tablet by mouth daily. , Disp: , Rfl:  .  Cholecalciferol (VITAMIN D3 PO), Take 2,000 Units by mouth daily., Disp: , Rfl:  .  furosemide (LASIX) 40 MG tablet, Take 40 mg by mouth daily. , Disp: , Rfl:  .  simvastatin (ZOCOR) 40 MG tablet, Take 40 mg by mouth at bedtime.  , Disp: , Rfl:   Objective Blood pressure 139/70, pulse 61, height 5' 6.5" (1.689 m), weight 192 lb (87.1 kg).  Photo documented Total  vaginal prolapse after hysterectomy  Pessary fitting performed, #5 is too large, #4 is a smidge too long but fits well side to side I avoid Gelhorns at all cost althought theoretically that is the best one for this problem  Pertinent ROS No burning with urination, frequency or urgency No nausea, vomiting or diarrhea Nor fever chills or other constitutional symptoms   Labs or studies     Impression Diagnoses this Encounter::   ICD-10-CM   1. Complete prolapse of vaginal vault s/p hysterectomy N99.3     Established relevant diagnosis(es):   Plan/Recommendations: No orders of the defined types were placed in this encounter.   Labs or Scans Ordered: No orders of the defined types were placed in this encounter.   Management:: >fit for Milex ring with support #4 with good result >short initial follow up 1 month then every 3-4 months for on going maintenance and management  Follow up Return in about 1 month (around 06/16/2018) for Follow up, with Dr Elonda Husky.         All questions were answered.

## 2018-06-14 ENCOUNTER — Encounter: Payer: Self-pay | Admitting: Obstetrics & Gynecology

## 2018-06-14 ENCOUNTER — Ambulatory Visit: Payer: Medicare Other | Admitting: Obstetrics & Gynecology

## 2018-06-14 ENCOUNTER — Other Ambulatory Visit: Payer: Self-pay

## 2018-06-14 VITALS — BP 130/71 | HR 49 | Ht 66.0 in | Wt 200.0 lb

## 2018-06-14 DIAGNOSIS — Z4689 Encounter for fitting and adjustment of other specified devices: Secondary | ICD-10-CM

## 2018-06-14 DIAGNOSIS — N993 Prolapse of vaginal vault after hysterectomy: Secondary | ICD-10-CM

## 2018-06-25 ENCOUNTER — Encounter: Payer: Self-pay | Admitting: Obstetrics & Gynecology

## 2018-06-25 NOTE — Progress Notes (Signed)
Chief Complaint  Patient presents with  . pessary maintenance    Blood pressure 130/71, pulse (!) 49, height 5\' 6"  (1.676 m), weight 200 lb (90.7 kg).  Leslie Barrera presents today for routine follow up related to her pessary.   She uses a Milex ring with support #4 since 04/2018 She reports no vaginal discharge or vaginal bleeding.  Exam reveals no undue vaginal mucosal pressure of breakdown, no discharge and no vaginal bleeding.  The pessary is removed, cleaned and replaced without difficulty.    Leslie Barrera will be sen back in 3 months for continued follow up.  Florian Buff, MD  06/25/2018 8:05 AM

## 2018-08-26 IMAGING — US US EXTREM LOW VENOUS*L*
1 series · 13 of 24 positions shown · non-contrast
Comparison: None.

CLINICAL DATA: 76-year-old female with a history of pain and
swelling



[Series 1: us extrem low venous*left* · 0.08mm/px · 13 of 36 slices shown]
[im 1/36]
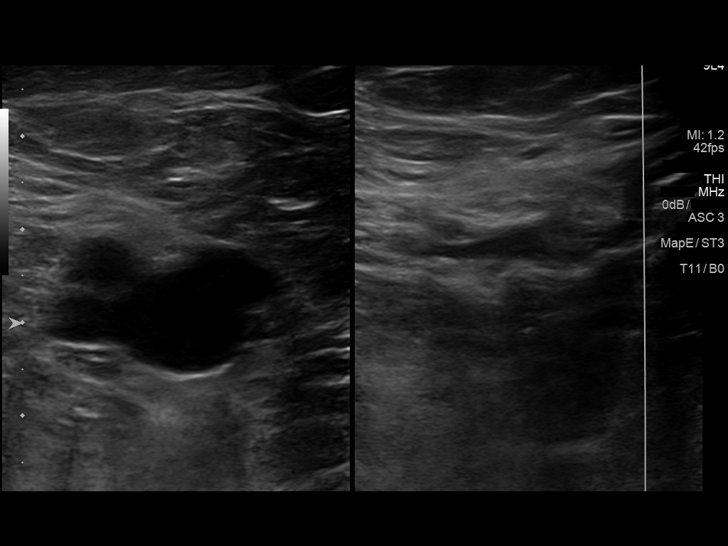
[im 4/36]
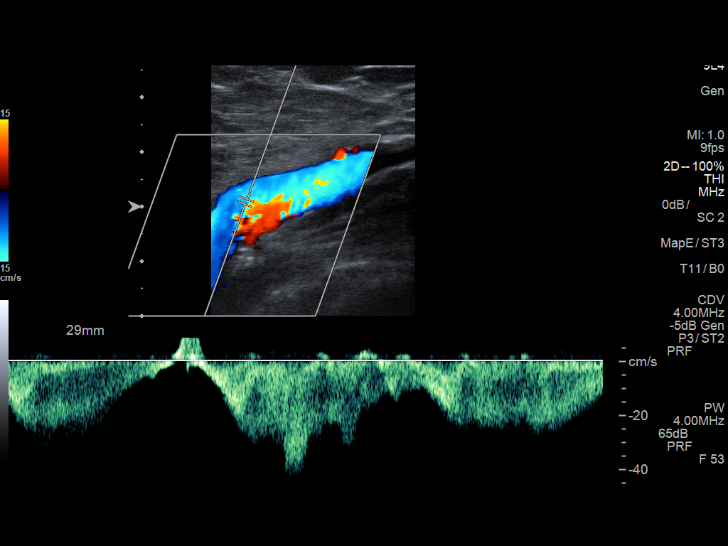
[im 7/36]
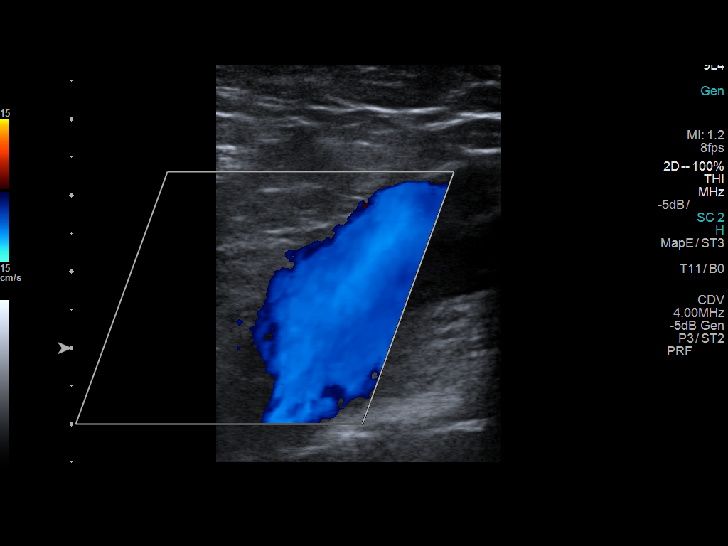
[im 10/36]
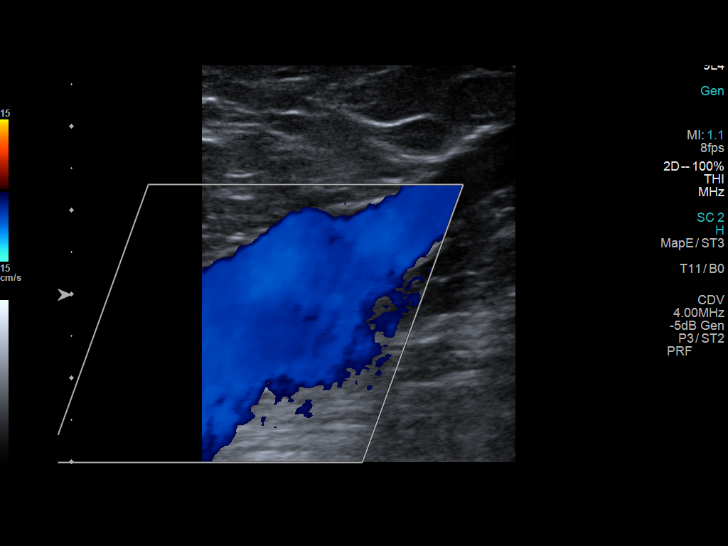
[im 13/36]
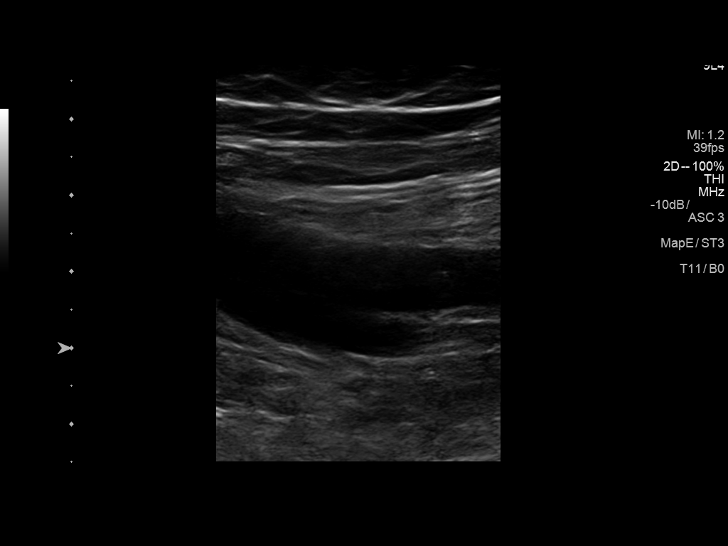
[im 16/36]
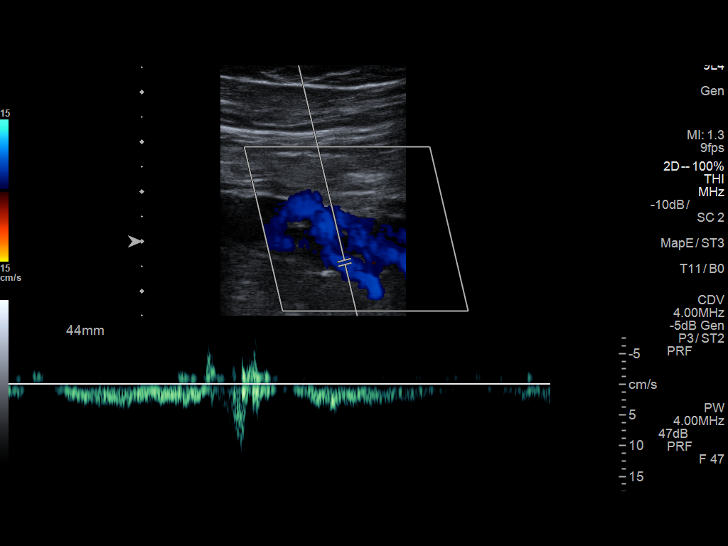
[im 19/36]
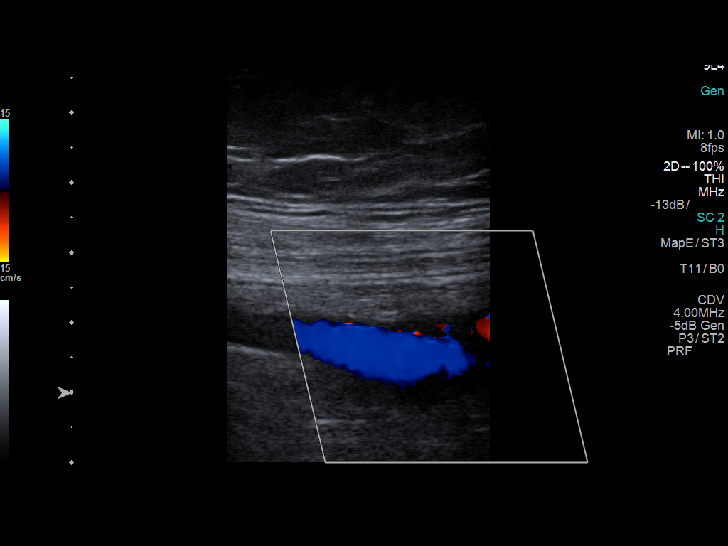
[im 20/36]
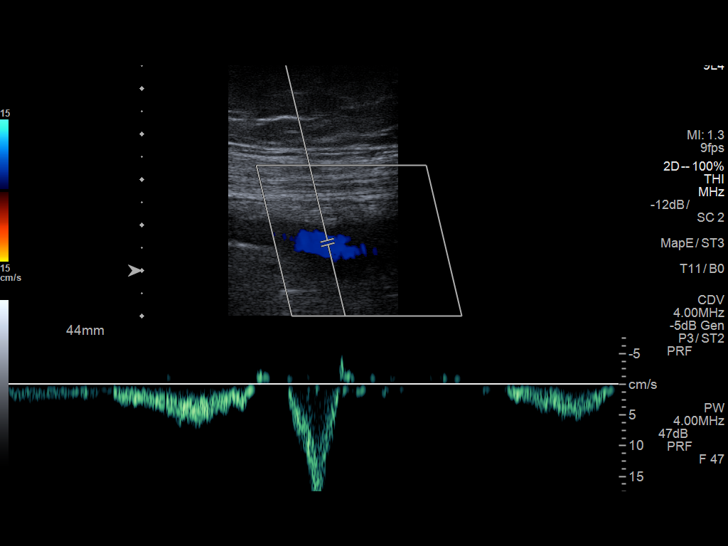
[im 23/36]
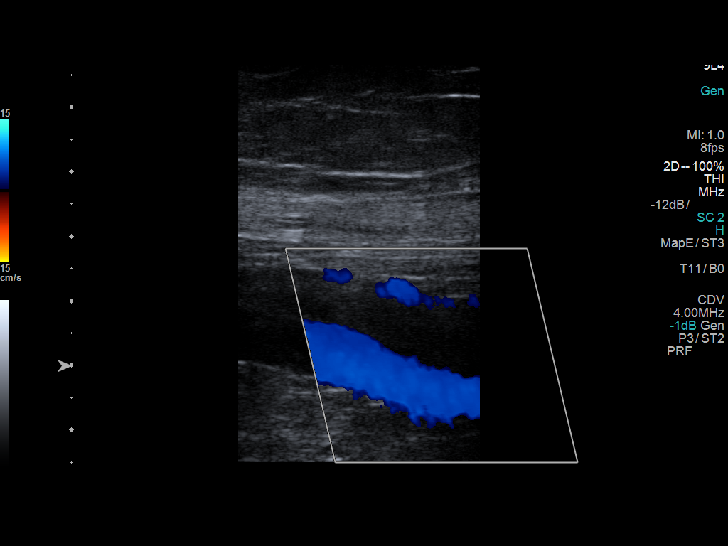
[im 26/36]
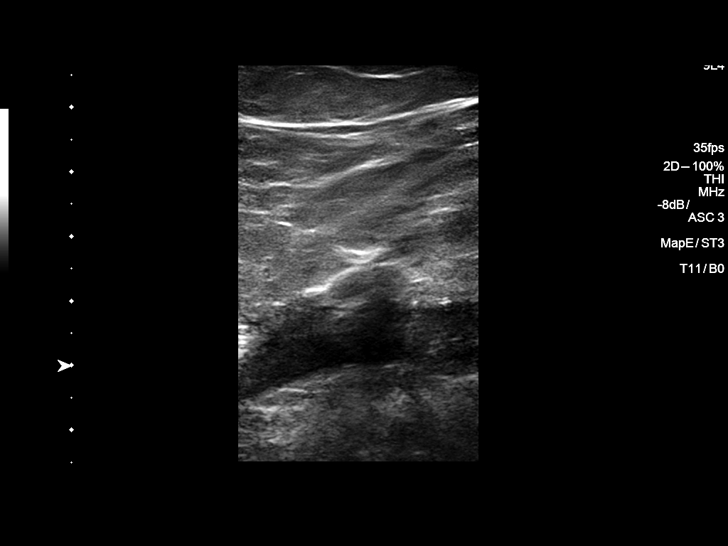
[im 29/36]
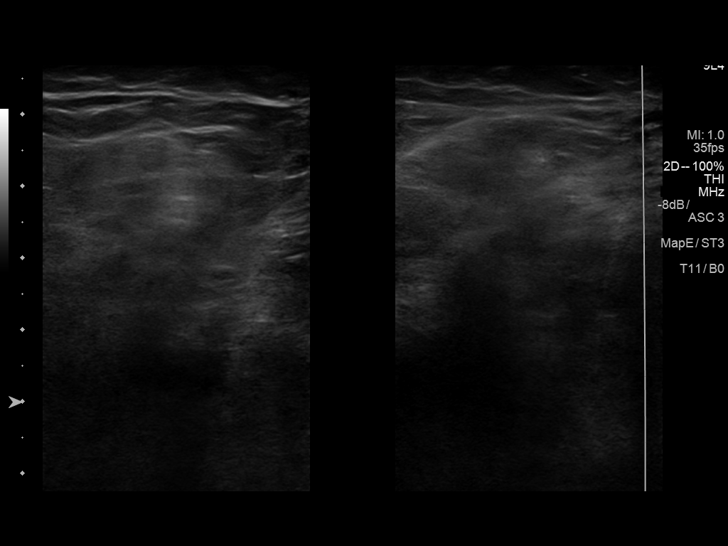
[im 32/36]
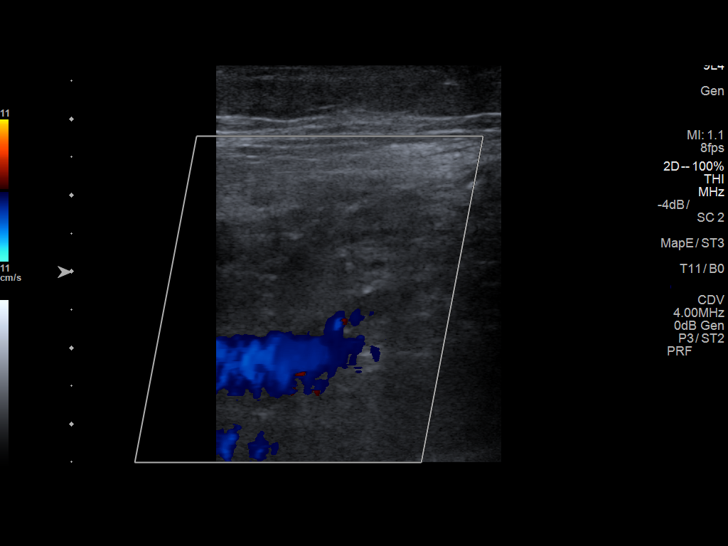
[im 36/36]
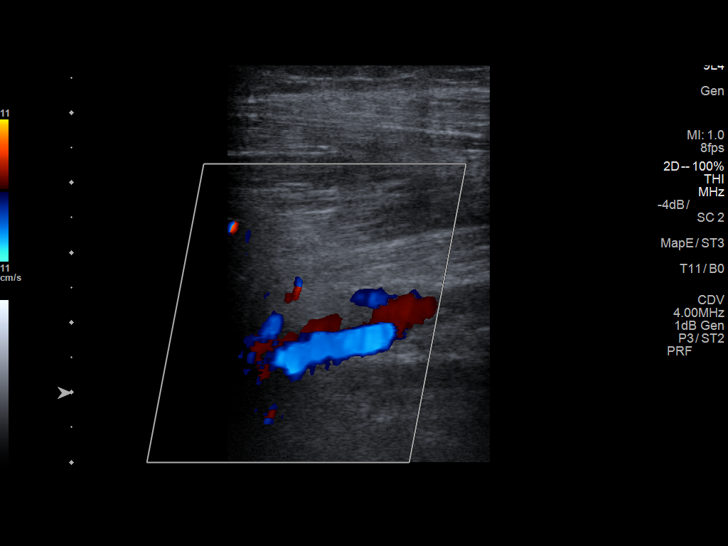

[13 of 24 positions shown; findings below may reference images not displayed]

FINDINGS: Contralateral Common Femoral Vein: Respiratory phasicity is normal
and symmetric with the symptomatic side. No evidence of thrombus.
Normal compressibility.

Common Femoral Vein: No evidence of thrombus. Normal
compressibility, respiratory phasicity and response to augmentation.

Saphenofemoral Junction: No evidence of thrombus. Normal
compressibility and flow on color Doppler imaging.

Profunda Femoral Vein: No evidence of thrombus. Normal
compressibility and flow on color Doppler imaging.

Femoral Vein: No evidence of thrombus. Normal compressibility,
respiratory phasicity and response to augmentation.

Popliteal Vein: No evidence of thrombus. Normal compressibility,
respiratory phasicity and response to augmentation.

Calf Veins: No evidence of thrombus. Normal compressibility and flow
on color Doppler imaging.

Superficial Great Saphenous Vein: No evidence of thrombus. Normal
compressibility and flow on color Doppler imaging.

Other Findings:  None.
IMPRESSION: Sonographic survey of the left lower extremity negative for DVT

## 2018-10-15 ENCOUNTER — Ambulatory Visit: Payer: Self-pay | Admitting: Obstetrics & Gynecology

## 2018-10-19 DIAGNOSIS — Z1389 Encounter for screening for other disorder: Secondary | ICD-10-CM | POA: Diagnosis not present

## 2018-10-19 DIAGNOSIS — R7309 Other abnormal glucose: Secondary | ICD-10-CM | POA: Diagnosis not present

## 2018-10-19 DIAGNOSIS — Z0001 Encounter for general adult medical examination with abnormal findings: Secondary | ICD-10-CM | POA: Diagnosis not present

## 2018-10-19 DIAGNOSIS — M1991 Primary osteoarthritis, unspecified site: Secondary | ICD-10-CM | POA: Diagnosis not present

## 2018-10-21 ENCOUNTER — Encounter: Payer: Self-pay | Admitting: Orthopedic Surgery

## 2018-10-21 ENCOUNTER — Encounter: Payer: Self-pay | Admitting: Obstetrics & Gynecology

## 2018-10-23 ENCOUNTER — Encounter: Payer: Self-pay | Admitting: Obstetrics & Gynecology

## 2018-10-23 ENCOUNTER — Ambulatory Visit: Payer: Medicare Other | Admitting: Obstetrics & Gynecology

## 2018-10-23 ENCOUNTER — Other Ambulatory Visit: Payer: Self-pay

## 2018-10-23 VITALS — BP 152/72 | HR 43 | Wt 198.0 lb

## 2018-10-23 DIAGNOSIS — Z4689 Encounter for fitting and adjustment of other specified devices: Secondary | ICD-10-CM | POA: Diagnosis not present

## 2018-10-23 DIAGNOSIS — N993 Prolapse of vaginal vault after hysterectomy: Secondary | ICD-10-CM

## 2018-10-23 NOTE — Progress Notes (Signed)
Chief Complaint  Patient presents with  . peesary maintenance    Blood pressure (!) 152/72, pulse (!) 43, weight 198 lb (89.8 kg).  Leslie Barrera presents today for routine follow up related to her pessary.   She uses a Milex ring with support #4 She reports no vaginal discharge or vaginal bleeding.  Exam reveals no undue vaginal mucosal pressure of breakdown, no discharge and no vaginal bleeding.  The pessary is removed, cleaned and replaced without difficulty.    [name] will be sen back in 4 months for continued follow up.  Florian Buff, MD  10/23/2018 11:07 AM

## 2018-11-07 DIAGNOSIS — Z23 Encounter for immunization: Secondary | ICD-10-CM | POA: Diagnosis not present

## 2018-11-07 DIAGNOSIS — E119 Type 2 diabetes mellitus without complications: Secondary | ICD-10-CM | POA: Diagnosis not present

## 2018-11-07 DIAGNOSIS — R7309 Other abnormal glucose: Secondary | ICD-10-CM | POA: Diagnosis not present

## 2018-11-07 DIAGNOSIS — I10 Essential (primary) hypertension: Secondary | ICD-10-CM | POA: Diagnosis not present

## 2018-11-07 DIAGNOSIS — E7849 Other hyperlipidemia: Secondary | ICD-10-CM | POA: Diagnosis not present

## 2019-02-21 ENCOUNTER — Telehealth: Payer: Self-pay | Admitting: Obstetrics & Gynecology

## 2019-02-21 DIAGNOSIS — H25813 Combined forms of age-related cataract, bilateral: Secondary | ICD-10-CM | POA: Diagnosis not present

## 2019-02-21 NOTE — Telephone Encounter (Signed)

## 2019-02-22 ENCOUNTER — Other Ambulatory Visit: Payer: Self-pay

## 2019-02-22 ENCOUNTER — Encounter: Payer: Self-pay | Admitting: Obstetrics & Gynecology

## 2019-02-22 ENCOUNTER — Ambulatory Visit: Payer: Medicare Other | Admitting: Obstetrics & Gynecology

## 2019-02-22 VITALS — BP 134/71 | HR 44 | Ht 66.0 in | Wt 192.0 lb

## 2019-02-22 DIAGNOSIS — N993 Prolapse of vaginal vault after hysterectomy: Secondary | ICD-10-CM | POA: Diagnosis not present

## 2019-02-22 DIAGNOSIS — Z4689 Encounter for fitting and adjustment of other specified devices: Secondary | ICD-10-CM

## 2019-02-22 NOTE — Progress Notes (Signed)
Chief Complaint  Patient presents with  . Pessary Check    Blood pressure 134/71, pulse (!) 44, height 5\' 6"  (1.676 m), weight 192 lb (87.1 kg).  Leslie Barrera presents today for routine follow up related to her pessary.   She uses a Milex ring with support #4 She reports no vaginal discharge or vaginal bleeding.  Exam reveals no undue vaginal mucosal pressure of breakdown, no discharge and no vaginal bleeding.  The pessary is removed, cleaned and replaced without difficulty.    Leslie Barrera will be sen back in 4 months for continued follow up.  Florian Buff, MD  02/22/2019 9:39 AM

## 2019-03-27 DIAGNOSIS — M1991 Primary osteoarthritis, unspecified site: Secondary | ICD-10-CM | POA: Diagnosis not present

## 2019-03-27 DIAGNOSIS — I10 Essential (primary) hypertension: Secondary | ICD-10-CM | POA: Diagnosis not present

## 2019-03-27 DIAGNOSIS — E7849 Other hyperlipidemia: Secondary | ICD-10-CM | POA: Diagnosis not present

## 2019-04-23 ENCOUNTER — Other Ambulatory Visit: Payer: Self-pay

## 2019-04-23 NOTE — Patient Outreach (Signed)
[facility]) Care Management  04/23/2019  Leslie Barrera 1942/04/79 [id number]   Medication Adherence call to Mrs. Leslie Barrera [facility] Identifiers Verify spoke with patient she is past due on Simvastatin 40 mg ,patient explain she takes 1 tablet daily and takes it on a regular basis patient said, she has a pill box but is not using it and will start using it now,patient has medication at this time and will order when due. Leslie Barrera is showing past due under [place].   [facility] Management Direct Dial [phone]  Fax [phone] [email or web address]

## 2019-04-27 DIAGNOSIS — I10 Essential (primary) hypertension: Secondary | ICD-10-CM | POA: Diagnosis not present

## 2019-04-27 DIAGNOSIS — E7849 Other hyperlipidemia: Secondary | ICD-10-CM | POA: Diagnosis not present

## 2019-06-19 ENCOUNTER — Telehealth: Payer: Self-pay | Admitting: Obstetrics & Gynecology

## 2019-06-19 NOTE — Telephone Encounter (Signed)
Called patient regarding appointment scheduled in our office and advised to come alone to the visits, however, a support person, over age 79, may accompany her  to appointment if assistance is needed for safety or care concerns. Otherwise, support persons should remain outside until the visit is complete.   We ask if you have had any exposure to anyone suspected or confirmed of having COVID-19, are awaiting test results for COVID-19 or if you are experiencing any of the following, to call and reschedule your appointment: fever, cough, shortness of breath, muscle pain, diarrhea, rash, vomiting, abdominal pain, red eye, weakness, bruising, bleeding, joint pain, or a severe headache.   Please know we will ask you these questions or similar questions when you arrive for your appointment and again it's how we are keeping everyone safe.    Also,to keep you safe, please use the provided hand sanitizer when you enter the office. We are asking everyone in the office to wear a mask to help prevent the spread of germs. If you have a mask of your own, please wear it to your appointment, if not, we are happy to provide one for you.  Thank you for understanding and your cooperation.    [facility] Staff    

## 2019-06-24 ENCOUNTER — Encounter: Payer: Self-pay | Admitting: Obstetrics & Gynecology

## 2019-06-24 ENCOUNTER — Ambulatory Visit: Payer: Medicare Other | Admitting: Obstetrics & Gynecology

## 2019-06-24 ENCOUNTER — Other Ambulatory Visit: Payer: Self-pay

## 2019-06-24 VITALS — BP 157/71 | HR 49 | Ht 66.5 in | Wt 199.0 lb

## 2019-06-24 DIAGNOSIS — Z4689 Encounter for fitting and adjustment of other specified devices: Secondary | ICD-10-CM

## 2019-06-24 DIAGNOSIS — N993 Prolapse of vaginal vault after hysterectomy: Secondary | ICD-10-CM | POA: Diagnosis not present

## 2019-06-24 NOTE — Progress Notes (Signed)
Chief Complaint  Patient presents with  . Pessary Check    Blood pressure (!) 157/71, pulse (!) 49, height 5' 6.5" (1.689 m), weight 199 lb (90.3 kg).  Leslie Barrera presents today for routine follow up related to her pessary.   She uses a Milex ring with support #4 She reports no vaginal discharge or vaginal bleeding.  Exam reveals no undue vaginal mucosal pressure of breakdown, no discharge and no vaginal bleeding.  The pessary is removed, cleaned and replaced without difficulty.    Leslie Barrera will be sen back in 4 months for continued follow up.  Florian Buff, MD  06/24/2019 9:00 AM

## 2019-06-27 DIAGNOSIS — E7849 Other hyperlipidemia: Secondary | ICD-10-CM | POA: Diagnosis not present

## 2019-06-27 DIAGNOSIS — I10 Essential (primary) hypertension: Secondary | ICD-10-CM | POA: Diagnosis not present

## 2019-07-28 DIAGNOSIS — R7309 Other abnormal glucose: Secondary | ICD-10-CM | POA: Diagnosis not present

## 2019-07-28 DIAGNOSIS — I10 Essential (primary) hypertension: Secondary | ICD-10-CM | POA: Diagnosis not present

## 2019-07-28 DIAGNOSIS — E7849 Other hyperlipidemia: Secondary | ICD-10-CM | POA: Diagnosis not present

## 2019-08-04 ENCOUNTER — Ambulatory Visit: Payer: Medicare Other

## 2019-08-04 ENCOUNTER — Other Ambulatory Visit: Payer: Self-pay

## 2019-08-04 DIAGNOSIS — Z23 Encounter for immunization: Secondary | ICD-10-CM | POA: Insufficient documentation

## 2019-08-04 NOTE — Progress Notes (Signed)
   Covid-19 Vaccination Clinic  Name:  Leslie Barrera    MRN: [id number] DOB: 1941/05/26  08/04/2019  [name] was observed post Covid-19 immunization for 15 minutes without incidence. She was provided with Vaccine Information Sheet and instruction to access the V-Safe system.   [name] was instructed to call 911 with any severe reactions post vaccine: Marland Kitchen Difficulty breathing  . Swelling of your face and throat  . A fast heartbeat  . A bad rash all over your body  . Dizziness and weakness    Immunizations Administered    Name Date Dose VIS Date Route   Moderna COVID-19 Vaccine 08/04/2019  1:00 PM 0.5 mL 05/28/2019 Intramuscular   Manufacturer: Moderna   Lot: [id number]   [id number]

## 2019-08-25 DIAGNOSIS — R7309 Other abnormal glucose: Secondary | ICD-10-CM | POA: Diagnosis not present

## 2019-08-25 DIAGNOSIS — E7849 Other hyperlipidemia: Secondary | ICD-10-CM | POA: Diagnosis not present

## 2019-08-25 DIAGNOSIS — I10 Essential (primary) hypertension: Secondary | ICD-10-CM | POA: Diagnosis not present

## 2019-09-04 ENCOUNTER — Ambulatory Visit: Payer: Medicare Other | Attending: Internal Medicine

## 2019-09-04 DIAGNOSIS — Z23 Encounter for immunization: Secondary | ICD-10-CM | POA: Insufficient documentation

## 2019-09-04 NOTE — Progress Notes (Signed)
   Covid-19 Vaccination Clinic  Name:  YOSSELIN GAIER    MRN: [id number] DOB: 24-May-1941  09/04/2019  Ms. Mockus was observed post Covid-19 immunization for 15 minutes without incident. She was provided with Vaccine Information Sheet and instruction to access the V-Safe system.   Ms. Kofler was instructed to call 911 with any severe reactions post vaccine: Marland Kitchen Difficulty breathing  . Swelling of face and throat  . A fast heartbeat  . A bad rash all over body  . Dizziness and weakness   Immunizations Administered    Name Date Dose VIS Date Route   Moderna COVID-19 Vaccine 09/04/2019  1:14 PM 0.5 mL 05/28/2019 Intramuscular   Manufacturer: Moderna   Lot: [id number]   [id number]

## 2019-09-25 DIAGNOSIS — R7309 Other abnormal glucose: Secondary | ICD-10-CM | POA: Diagnosis not present

## 2019-09-25 DIAGNOSIS — E7849 Other hyperlipidemia: Secondary | ICD-10-CM | POA: Diagnosis not present

## 2019-10-21 ENCOUNTER — Telehealth: Payer: Self-pay | Admitting: Obstetrics & Gynecology

## 2019-10-21 NOTE — Telephone Encounter (Signed)

## 2019-10-22 ENCOUNTER — Other Ambulatory Visit: Payer: Self-pay

## 2019-10-22 ENCOUNTER — Ambulatory Visit: Payer: Medicare Other | Admitting: Obstetrics & Gynecology

## 2019-10-22 ENCOUNTER — Encounter: Payer: Self-pay | Admitting: Obstetrics & Gynecology

## 2019-10-22 VITALS — BP 154/75 | HR 47 | Ht 66.2 in | Wt 197.0 lb

## 2019-10-22 DIAGNOSIS — Z466 Encounter for fitting and adjustment of urinary device: Secondary | ICD-10-CM | POA: Diagnosis not present

## 2019-10-22 DIAGNOSIS — N993 Prolapse of vaginal vault after hysterectomy: Secondary | ICD-10-CM

## 2019-10-22 DIAGNOSIS — Z4689 Encounter for fitting and adjustment of other specified devices: Secondary | ICD-10-CM

## 2019-10-22 NOTE — Progress Notes (Signed)
Chief Complaint  Patient presents with  . Pessary Check    Blood pressure (!) 154/75, pulse (!) 47, height 5' 6.2" (1.681 m), weight 197 lb (89.4 kg).  Leslie Barrera presents today for routine follow up related to her pessary.   She uses a Milex ring with support #4 She reports no vaginal discharge or vaginal bleeding.  Exam reveals no undue vaginal mucosal pressure of breakdown, no discharge and no vaginal bleeding.  The pessary is removed, cleaned and replaced without difficulty.    Leslie Barrera will be sen back in 4 months for continued follow up.  Florian Buff, MD  10/22/2019 9:03 AM

## 2019-10-25 DIAGNOSIS — E119 Type 2 diabetes mellitus without complications: Secondary | ICD-10-CM | POA: Diagnosis not present

## 2019-10-25 DIAGNOSIS — R946 Abnormal results of thyroid function studies: Secondary | ICD-10-CM | POA: Diagnosis not present

## 2019-10-25 DIAGNOSIS — E785 Hyperlipidemia, unspecified: Secondary | ICD-10-CM | POA: Diagnosis not present

## 2019-10-25 DIAGNOSIS — Z1389 Encounter for screening for other disorder: Secondary | ICD-10-CM | POA: Diagnosis not present

## 2019-10-25 DIAGNOSIS — R7309 Other abnormal glucose: Secondary | ICD-10-CM | POA: Diagnosis not present

## 2019-10-25 DIAGNOSIS — Z0000 Encounter for general adult medical examination without abnormal findings: Secondary | ICD-10-CM | POA: Diagnosis not present

## 2019-10-25 DIAGNOSIS — E7849 Other hyperlipidemia: Secondary | ICD-10-CM | POA: Diagnosis not present

## 2019-10-25 DIAGNOSIS — I10 Essential (primary) hypertension: Secondary | ICD-10-CM | POA: Diagnosis not present

## 2019-11-25 DIAGNOSIS — R7309 Other abnormal glucose: Secondary | ICD-10-CM | POA: Diagnosis not present

## 2019-11-25 DIAGNOSIS — I10 Essential (primary) hypertension: Secondary | ICD-10-CM | POA: Diagnosis not present

## 2019-11-25 DIAGNOSIS — E7849 Other hyperlipidemia: Secondary | ICD-10-CM | POA: Diagnosis not present

## 2019-12-16 DIAGNOSIS — H25813 Combined forms of age-related cataract, bilateral: Secondary | ICD-10-CM | POA: Diagnosis not present

## 2019-12-25 DIAGNOSIS — I10 Essential (primary) hypertension: Secondary | ICD-10-CM | POA: Diagnosis not present

## 2019-12-25 DIAGNOSIS — E7849 Other hyperlipidemia: Secondary | ICD-10-CM | POA: Diagnosis not present

## 2019-12-25 DIAGNOSIS — R7309 Other abnormal glucose: Secondary | ICD-10-CM | POA: Diagnosis not present

## 2020-02-21 ENCOUNTER — Encounter: Payer: Self-pay | Admitting: Obstetrics & Gynecology

## 2020-02-21 ENCOUNTER — Ambulatory Visit: Payer: Medicare Other | Admitting: Obstetrics & Gynecology

## 2020-02-21 VITALS — BP 144/73 | HR 57 | Ht 66.5 in | Wt 193.5 lb

## 2020-02-21 DIAGNOSIS — Z4689 Encounter for fitting and adjustment of other specified devices: Secondary | ICD-10-CM

## 2020-02-21 DIAGNOSIS — N993 Prolapse of vaginal vault after hysterectomy: Secondary | ICD-10-CM

## 2020-02-21 NOTE — Progress Notes (Signed)
Chief Complaint  Patient presents with  . Pessary Check    Blood pressure (!) 144/73, pulse (!) 57, height 5' 6.5" (1.689 m), weight 193 lb 8 oz (87.8 kg).  Leslie Barrera presents today for routine follow up related to her pessary.   She uses a Milex ring with support #4 She reports no vaginal discharge or vaginal bleeding.  Exam reveals no undue vaginal mucosal pressure of breakdown, no discharge and no vaginal bleeding.  The pessary is removed, cleaned and replaced without difficulty.      ICD-10-CM   1. Pessary maintenance, Milex ring with support #4, original fit 04/2018  Z46.89   2. Complete prolapse of vaginal vault s/p hysterectomy  N99.3      Leslie Barrera will be sen back in 4 months for continued follow up.  Florian Buff, MD  02/21/2020 10:27 AM

## 2020-02-25 DIAGNOSIS — I10 Essential (primary) hypertension: Secondary | ICD-10-CM | POA: Diagnosis not present

## 2020-02-25 DIAGNOSIS — E7849 Other hyperlipidemia: Secondary | ICD-10-CM | POA: Diagnosis not present

## 2020-02-25 DIAGNOSIS — E559 Vitamin D deficiency, unspecified: Secondary | ICD-10-CM | POA: Diagnosis not present

## 2020-03-23 DIAGNOSIS — L708 Other acne: Secondary | ICD-10-CM | POA: Diagnosis not present

## 2020-03-30 DIAGNOSIS — H25813 Combined forms of age-related cataract, bilateral: Secondary | ICD-10-CM | POA: Diagnosis not present

## 2020-04-09 DIAGNOSIS — Z23 Encounter for immunization: Secondary | ICD-10-CM | POA: Diagnosis not present

## 2020-04-22 NOTE — H&P (Signed)
Surgical History & Physical  Patient Name: Leslie Barrera DOB: October 16, 1940  Surgery: Cataract extraction with intraocular lens implant phacoemulsification; Left Eye  Surgeon: Baruch Goldmann MD Surgery Date:  05/04/2020 Pre-Op Date:  04/22/2020  HPI: A 52 Yr. old female patient 1. The patient complains of difficulty when driving at night, which began 1 year ago. Both eyes are affected OD>OS. The episode is gradual. Symptoms occur when the patient is driving. This negatively affecting her quality of life. Pt is not using eye drops. HPI was performed by Baruch Goldmann .  Medical History: Cataracts Arthritis High Blood Pressure LDL  Review of Systems Negative Allergic/Immunologic Negative Cardiovascular Negative Constitutional Negative Ear, Nose, Mouth & Throat Negative Endocrine Negative Eyes Negative Gastrointestinal Negative Genitourinary Negative Hemotologic/Lymphatic Negative Integumentary Negative Musculoskeletal Negative Neurological Negative Psychiatry Negative Respiratory  Social   Never smoked    Medication Bisoprolol-HCTZ, Furosemide, Simvastatin, Aspirin, Vitamin D3,   Sx/Procedures Right knee replacement, Hysterectomy,   Drug Allergies  Tetracycline,   History & Physical: Heent:  Cataract, Left eye NECK: supple without bruits LUNGS: lungs clear to auscultation CV: regular rate and rhythm Abdomen: soft and non-tender  Impression & Plan: Assessment: 1.  COMBINED FORMS AGE RELATED CATARACT; Both Eyes (H25.813)  Plan: 1.  Cataract accounts for the patient's decreased vision. This visual impairment is not correctable with a tolerable change in glasses or contact lenses. Cataract surgery with an implantation of a new lens should significantly improve the visual and functional status of the patient. Discussed all risks, benefits, alternatives, and potential complications. Discussed the procedures and recovery. Patient desires to have surgery. A-scan ordered  and performed today for intra-ocular lens calculations. The surgery will be performed in order to improve vision for driving, reading, and for eye examinations. Recommend phacoemulsification with intra-ocular lens. Left Eye functional eye - first. Dilates well - shugarcaine by protocol. Right Eye - Goal -2.50.

## 2020-04-27 DIAGNOSIS — H25812 Combined forms of age-related cataract, left eye: Secondary | ICD-10-CM | POA: Diagnosis not present

## 2020-04-29 NOTE — Patient Instructions (Signed)
Leslie Barrera  04/29/2020     @PREFPERIOPPHARMACY @   Your procedure is scheduled on  05/04/2020.  Report to [facility] at  1030  A.M.  Call this number if you have problems the morning of surgery:  [phone]   Remember:  Do not eat or drink after midnight.                          Take these medicines the morning of surgery with A SIP OF WATER  Bisoprolol.    Do not wear jewelry, make-up or nail polish.  Do not wear lotions, powders, or perfumes. Please wear deodorant and brush your teeth.  Do not shave 48 hours prior to surgery.  Men may shave face and neck.  Do not bring valuables to the hospital.  [facility] is not responsible for any belongings or valuables.  Contacts, dentures or bridgework may not be worn into surgery.  Leave your suitcase in the car.  After surgery it may be brought to your room.  For patients admitted to the hospital, discharge time will be determined by your treatment team.  Patients discharged the day of surgery will not be allowed to drive home.   Name and phone number of your driver:   family Special instructions:  DO NOT smoke the morning of your procedure.  Please read over the following fact sheets that you were given. Anesthesia Post-op Instructions and Care and Recovery After Surgery      Cataract Surgery, Care After This sheet gives you information about how to care for yourself after your procedure. Your health care provider may also give you more specific instructions. If you have problems or questions, contact your health care provider. What can I expect after the procedure? After the procedure, it is common to have:  Itching.  Discomfort.  Fluid discharge.  Sensitivity to light and to touch.  Bruising in or around the eye.  Mild blurred vision. Follow these instructions at home: Eye care   Do not touch or rub your eyes.  Protect your eyes as told by your health care provider. You may be told to wear a  protective eye shield or sunglasses.  Do not put a contact lens into the affected eye or eyes until your health care provider approves.  Keep the area around your eye clean and dry: ? Avoid swimming. ? Do not allow water to hit you directly in the face while showering. ? Keep soap and shampoo out of your eyes.  Check your eye every day for signs of infection. Watch for: ? Redness, swelling, or pain. ? Fluid, blood, or pus. ? Warmth. ? A bad smell. ? Vision that is getting worse. ? Sensitivity that is getting worse. Activity  Do not drive for 24 hours if you were given a sedative during your procedure.  Avoid strenuous activities, such as playing contact sports, for as long as told by your health care provider.  Do not drive or use heavy machinery until your health care provider approves.  Do not bend or lift heavy objects. Bending increases pressure in the eye. You can walk, climb stairs, and do light household chores.  Ask your health care provider when you can return to work. If you work in a dusty environment, you may be advised to wear protective eyewear for a period of time. General instructions  Take or apply over-the-counter and prescription medicines only as told by  your health care provider. This includes eye drops.  Keep all follow-up visits as told by your health care provider. This is important. Contact a health care provider if:  You have increased bruising around your eye.  You have pain that is not helped with medicine.  You have a fever.  You have redness, swelling, or pain in your eye.  You have fluid, blood, or pus coming from your incision.  Your vision gets worse.  Your sensitivity to light gets worse. Get help right away if:  You have sudden loss of vision.  You see flashes of light or spots (floaters).  You have severe eye pain.  You develop nausea or vomiting. Summary  After your procedure, it is common to have itching, discomfort,  bruising, fluid discharge, or sensitivity to light.  Follow instructions from your health care provider about caring for your eye after the procedure.  Do not rub your eye after the procedure. You may need to wear eye protection or sunglasses. Do not wear contact lenses. Keep the area around your eye clean and dry.  Avoid activities that require a lot of effort. These include playing sports and lifting heavy objects.  Contact a health care provider if you have increased bruising, pain that does not go away, or a fever. Get help right away if you suddenly lose your vision, see flashes of light or spots, or have severe pain in the eye. This information is not intended to replace advice given to you by your health care provider. Make sure you discuss any questions you have with your health care provider. Document Revised: 04/09/2019 Document Reviewed: 12/11/2017 Elsevier Patient Education  [place].

## 2020-04-30 ENCOUNTER — Encounter (HOSPITAL_COMMUNITY): Payer: Self-pay

## 2020-04-30 ENCOUNTER — Other Ambulatory Visit (HOSPITAL_COMMUNITY)
Admission: RE | Admit: 2020-04-30 | Discharge: 2020-04-30 | Disposition: A | Discharge status: Home / Self Care | Payer: Medicare Other | Source: Ambulatory Visit | Attending: Ophthalmology | Admitting: Ophthalmology

## 2020-04-30 ENCOUNTER — Other Ambulatory Visit: Payer: Self-pay

## 2020-04-30 ENCOUNTER — Encounter (HOSPITAL_COMMUNITY)
Admission: RE | Admit: 2020-04-30 | Discharge: 2020-04-30 | Disposition: A | Discharge status: Home / Self Care | Payer: Medicare Other | Source: Ambulatory Visit | Attending: Ophthalmology | Admitting: Ophthalmology

## 2020-04-30 DIAGNOSIS — Z20822 Contact with and (suspected) exposure to covid-19: Secondary | ICD-10-CM | POA: Insufficient documentation

## 2020-04-30 DIAGNOSIS — Z01818 Encounter for other preprocedural examination: Secondary | ICD-10-CM | POA: Diagnosis not present

## 2020-04-30 LAB — BASIC METABOLIC PANEL
Anion gap: 8 (ref 5–15)
BUN: 20 mg/dL (ref 8–23)
CO2: 28 mmol/L (ref 22–32)
Calcium: 9.2 mg/dL (ref 8.9–10.3)
Chloride: 101 mmol/L (ref 98–111)
Creatinine, Ser: 0.74 mg/dL (ref 0.44–1.00)
GFR, Estimated: >60 mL/min (ref >60)
Glucose, Bld: 114 mg/dL — ABNORMAL HIGH (ref 70–99)
Potassium: 3.4 mmol/L — ABNORMAL LOW (ref 3.5–5.1)
Sodium: 137 mmol/L (ref 135–145)

## 2020-04-30 LAB — SARS CORONAVIRUS 2 (TAT 6-24 HRS): SARS Coronavirus 2: NEGATIVE

## 2020-05-04 ENCOUNTER — Encounter (HOSPITAL_COMMUNITY)
Admission: RE | Disposition: A | Discharge status: Home / Self Care | Payer: Self-pay | Source: Home / Self Care | Attending: Ophthalmology

## 2020-05-04 ENCOUNTER — Ambulatory Visit (HOSPITAL_COMMUNITY): Payer: Medicare Other | Admitting: Anesthesiology

## 2020-05-04 ENCOUNTER — Ambulatory Visit (HOSPITAL_COMMUNITY)
Admission: RE | Admit: 2020-05-04 | Discharge: 2020-05-04 | Disposition: A | Discharge status: Home / Self Care | Payer: Medicare Other | Attending: Ophthalmology | Admitting: Ophthalmology

## 2020-05-04 ENCOUNTER — Encounter (HOSPITAL_COMMUNITY): Payer: Self-pay | Admitting: Ophthalmology

## 2020-05-04 DIAGNOSIS — H25812 Combined forms of age-related cataract, left eye: Secondary | ICD-10-CM | POA: Insufficient documentation

## 2020-05-04 DIAGNOSIS — Z881 Allergy status to other antibiotic agents status: Secondary | ICD-10-CM | POA: Insufficient documentation

## 2020-05-04 DIAGNOSIS — Z96651 Presence of right artificial knee joint: Secondary | ICD-10-CM | POA: Diagnosis not present

## 2020-05-04 HISTORY — PX: CATARACT EXTRACTION W/PHACO: SHX586

## 2020-05-04 SURGERY — PHACOEMULSIFICATION, CATARACT, WITH IOL INSERTION
Anesthesia: Monitor Anesthesia Care | Site: Eye | Laterality: Left

## 2020-05-04 MED ORDER — NEOMYCIN-POLYMYXIN-DEXAMETH 3.5-10000-0.1 OP SUSP
OPHTHALMIC | Status: DC | PRN
  Administered 2020-05-04: 1 [drp] via OPHTHALMIC

## 2020-05-04 MED ORDER — LIDOCAINE HCL 3.5 % OP GEL
1.0000 "application " | Freq: Once | OPHTHALMIC | Status: AC
  Administered 2020-05-04: 1 via OPHTHALMIC

## 2020-05-04 MED ORDER — PROVISC 10 MG/ML IO SOLN
INTRAOCULAR | Status: DC | PRN
  Administered 2020-05-04: 0.85 mL via INTRAOCULAR

## 2020-05-04 MED ORDER — LIDOCAINE HCL (PF) 1 % IJ SOLN
INTRAOCULAR | Status: DC | PRN
  Administered 2020-05-04: 1 mL via OPHTHALMIC

## 2020-05-04 MED ORDER — EPINEPHRINE PF 1 MG/ML IJ SOLN
INTRAMUSCULAR | Status: AC
  Filled 2020-05-04: qty 2

## 2020-05-04 MED ORDER — BSS IO SOLN
INTRAOCULAR | Status: DC | PRN
  Administered 2020-05-04: 15 mL via INTRAOCULAR

## 2020-05-04 MED ORDER — POVIDONE-IODINE 5 % OP SOLN
OPHTHALMIC | Status: DC | PRN
  Administered 2020-05-04: 1 via OPHTHALMIC

## 2020-05-04 MED ORDER — CYCLOPENTOLATE-PHENYLEPHRINE 0.2-1 % OP SOLN
1.0000 [drp] | OPHTHALMIC | Status: AC | PRN
  Administered 2020-05-04 (×3): 1 [drp] via OPHTHALMIC

## 2020-05-04 MED ORDER — TETRACAINE HCL 0.5 % OP SOLN
1.0000 [drp] | OPHTHALMIC | Status: AC | PRN
  Administered 2020-05-04 (×3): 1 [drp] via OPHTHALMIC

## 2020-05-04 MED ORDER — EPINEPHRINE PF 1 MG/ML IJ SOLN
INTRAOCULAR | Status: DC | PRN
  Administered 2020-05-04: 500 mL

## 2020-05-04 MED ORDER — PHENYLEPHRINE HCL 2.5 % OP SOLN
1.0000 [drp] | OPHTHALMIC | Status: AC | PRN
  Administered 2020-05-04 (×3): 1 [drp] via OPHTHALMIC

## 2020-05-04 MED ORDER — SODIUM HYALURONATE 23 MG/ML IO SOLN
INTRAOCULAR | Status: DC | PRN
  Administered 2020-05-04: 0.6 mL via INTRAOCULAR

## 2020-05-04 MED ORDER — STERILE WATER FOR IRRIGATION IR SOLN
Status: DC | PRN
  Administered 2020-05-04: 250 mL

## 2020-05-04 SURGICAL SUPPLY — 17 items
CLOTH BEACON ORANGE TIMEOUT ST (SAFETY) ×2 IMPLANT
EYE SHIELD UNIVERSAL CLEAR (GAUZE/BANDAGES/DRESSINGS) ×3 IMPLANT
GLOVE BIOGEL PI IND STRL 7.0 (GLOVE) IMPLANT
GLOVE BIOGEL PI IND STRL 7.5 (GLOVE) ×1 IMPLANT
GLOVE BIOGEL PI INDICATOR 7.0 (GLOVE) ×2
GLOVE BIOGEL PI INDICATOR 7.5 (GLOVE) ×2
GOWN STRL REUS W/ TWL XL LVL3 (GOWN DISPOSABLE) ×1 IMPLANT
GOWN STRL REUS W/TWL XL LVL3 (GOWN DISPOSABLE) ×3
NDL HYPO 18GX1.5 BLUNT FILL (NEEDLE) IMPLANT
NEEDLE HYPO 18GX1.5 BLUNT FILL (NEEDLE) ×3 IMPLANT
PAD ARMBOARD 7.5X6 YLW CONV (MISCELLANEOUS) ×2 IMPLANT
SYR TB 1ML LL NO SAFETY (SYRINGE) ×2 IMPLANT
TAPE SURG TRANSPORE 1 IN (GAUZE/BANDAGES/DRESSINGS) IMPLANT
TAPE SURGICAL TRANSPORE 1 IN (GAUZE/BANDAGES/DRESSINGS) ×3
VISCOELASTIC ADDITIONAL (OPHTHALMIC RELATED) IMPLANT
WATER STERILE IRR 250ML POUR (IV SOLUTION) ×2 IMPLANT
tecnis IOL (Intraocular Lens) ×2 IMPLANT

## 2020-05-04 NOTE — Anesthesia Preprocedure Evaluation (Signed)
Anesthesia Evaluation  Patient identified by MRN, date of birth, ID band Patient awake    Reviewed: Allergy & Precautions, NPO status , Patient's Chart, lab work & pertinent test results  History of Anesthesia Complications Negative for: history of anesthetic complications  Airway Mallampati: II  TM Distance: >3 FB Neck ROM: Full    Dental  (+) Partial Upper, Dental Advisory Given   Pulmonary neg pulmonary ROS,    Pulmonary exam normal breath sounds clear to auscultation       Cardiovascular Exercise Tolerance: Good hypertension, Pt. on medications Normal cardiovascular exam Rhythm:Regular Rate:Normal     Neuro/Psych negative neurological ROS  negative psych ROS   GI/Hepatic negative GI ROS, Neg liver ROS,   Endo/Other  negative endocrine ROS  Renal/GU negative Renal ROS  negative genitourinary   Musculoskeletal negative musculoskeletal ROS (+)   Abdominal   Peds  Hematology negative hematology ROS (+)   Anesthesia Other Findings   Reproductive/Obstetrics negative OB ROS                            Anesthesia Physical Anesthesia Plan  ASA: II  Anesthesia Plan: MAC   Post-op Pain Management:    Induction:   PONV Risk Score and Plan:   Airway Management Planned: Nasal Cannula and Natural Airway  Additional Equipment:   Intra-op Plan:   Post-operative Plan:   Informed Consent: I have reviewed the patients History and Physical, chart, labs and discussed the procedure including the risks, benefits and alternatives for the proposed anesthesia with the patient or authorized representative who has indicated his/her understanding and acceptance.     Dental advisory given  Plan Discussed with: CRNA and Surgeon  Anesthesia Plan Comments:         Anesthesia Quick Evaluation

## 2020-05-04 NOTE — Anesthesia Postprocedure Evaluation (Signed)
Anesthesia Post Note  Patient: Leslie Barrera  Procedure(s) Performed: CATARACT EXTRACTION PHACO AND INTRAOCULAR LENS PLACEMENT ([name]) (Left Eye)  Patient location during evaluation: Phase II Anesthesia Type: MAC Level of consciousness: awake, oriented, awake and alert and patient cooperative Pain management: satisfactory to patient Vital Signs Assessment: post-procedure vital signs reviewed and stable Respiratory status: spontaneous breathing, respiratory function stable and nonlabored ventilation Cardiovascular status: stable Postop Assessment: no apparent nausea or vomiting Anesthetic complications: no   No complications documented.   Last Vitals:  Vitals:   05/04/20 1200 05/04/20 1253  BP: (!) 141/75 137/72  Pulse: (!) 55 (!) 58  Resp: 15 16  Temp:  36.7 C  SpO2: 97% 96%    Last Pain:  Vitals:   05/04/20 1253  TempSrc: Axillary  PainSc: 0-No pain                 Dreama Kuna

## 2020-05-04 NOTE — Discharge Instructions (Signed)
Please discharge patient when stable, will follow up today with Dr. Meosha Castanon at the [facility] [place] office immediately following discharge.  Leave shield in place until visit.  All paperwork with discharge instructions will be given at the office.  [facility] [place] Address:  [address]  [place], [place] [address]  

## 2020-05-04 NOTE — Interval H&P Note (Signed)
History and Physical Interval Note:  05/04/2020 11:56 AM  Leslie Barrera  has presented today for surgery, with the diagnosis of Nuclear sclerotic cataract - Left eye.  The various methods of treatment have been discussed with the patient and family. After consideration of risks, benefits and other options for treatment, the patient has consented to  Procedure(s) with comments: CATARACT EXTRACTION PHACO AND INTRAOCULAR LENS PLACEMENT ([facility]) (Left) - CDE:  as a surgical intervention.  The patient's history has been reviewed, patient examined, no change in status, stable for surgery.  I have reviewed the patient's chart and labs.  Questions were answered to the patient's satisfaction.     Baruch Goldmann

## 2020-05-04 NOTE — Transfer of Care (Signed)
Immediate Anesthesia Transfer of Care Note  Patient: Leslie Barrera  Procedure(s) Performed: CATARACT EXTRACTION PHACO AND INTRAOCULAR LENS PLACEMENT ([place]) (Left Eye)  Patient Location: PACU  Anesthesia Type:MAC  Level of Consciousness: awake, alert , oriented and patient cooperative  Airway & Oxygen Therapy: Patient Spontanous Breathing  Post-op Assessment: Report given to RN, Post -op Vital signs reviewed and stable and Patient moving all extremities X 4  Post vital signs: Reviewed and stable  Last Vitals:  Vitals Value Taken Time  BP    Temp    Pulse    Resp    SpO2      Last Pain:  Vitals:   05/04/20 1132  TempSrc: Oral  PainSc: 0-No pain      Patients Stated Pain Goal: 8 (29/92/42 6834)  Complications: No complications documented.

## 2020-05-04 NOTE — Op Note (Signed)
Date of procedure: 05/04/20  Pre-operative diagnosis: Visually significant age-related combined cataract, Left Eye (H25.812)  Post-operative diagnosis: Visually significant age-related combined cataract, Left Eye (H25.812)  Procedure: Removal of cataract via phacoemulsification and insertion of intra-ocular lens [name]  +25.5D into the capsular bag of the Left Eye  Attending surgeon: Gerda Diss. Kyleah Pensabene, MD, MA  Anesthesia: MAC, Topical Akten  Complications: None  Estimated Blood Loss: <80m (minimal)  Specimens: None  Implants: As above  Indications:  Visually significant age-related cataract, Left Eye  Procedure:  The patient was seen and identified in the pre-operative area. The operative eye was identified and dilated.  The operative eye was marked.  Topical anesthesia was administered to the operative eye.     The patient was then to the operative suite and placed in the supine position.  A timeout was performed confirming the patient, procedure to be performed, and all other relevant information.   The patient's face was prepped and draped in the usual fashion for intra-ocular surgery.  A lid speculum was placed into the operative eye and the surgical microscope moved into place and focused.  An inferotemporal paracentesis was created using a 20 gauge paracentesis blade.  Shugarcaine was injected into the anterior chamber.  Viscoelastic was injected into the anterior chamber.  A temporal clear-corneal main wound incision was created using a 2.439mmicrokeratome.  A continuous curvilinear capsulorrhexis was initiated using an irrigating cystitome and completed using capsulorrhexis forceps.  Hydrodissection and hydrodeliniation were performed.  Viscoelastic was injected into the anterior chamber.  A phacoemulsification handpiece and a chopper as a second instrument were used to remove the nucleus and epinucleus. The irrigation/aspiration handpiece was used to remove  any remaining cortical material.   The capsular bag was reinflated with viscoelastic, checked, and found to be intact.  The intraocular lens was inserted into the capsular bag.  The irrigation/aspiration handpiece was used to remove any remaining viscoelastic.  The clear corneal wound and paracentesis wounds were then hydrated and checked with Weck-Cels to be watertight.  The lid-speculum was removed.  The drape was removed.  The patient's face was cleaned with a wet and dry 4x4.   Maxitrol was instilled in the eye. A clear shield was taped over the eye. The patient was taken to the post-operative care unit in good condition, having tolerated the procedure well.  Post-Op Instructions: [facility] and will receive all other orders and instructions.

## 2020-05-05 ENCOUNTER — Encounter (HOSPITAL_COMMUNITY): Payer: Self-pay | Admitting: Ophthalmology

## 2020-05-11 DIAGNOSIS — H25811 Combined forms of age-related cataract, right eye: Secondary | ICD-10-CM | POA: Diagnosis not present

## 2020-05-12 NOTE — H&P (Signed)
Surgical History & Physical  Patient Name: Leslie Barrera DOB: 11-28-1940  Surgery: Cataract extraction with intraocular lens implant phacoemulsification; Right Eye  Surgeon: Baruch Goldmann MD Surgery Date:  05/18/2020 Pre-Op Date:  05/11/2020  HPI: A 4 Yr. old female patient Pt is present for post op OS/pre op OD. The patient is returning after cataract post-op. The left eye is affected. Status post cataract post-op, which began 1 week ago: Since the last visit, the affected area is doing well. The patient's vision is improved and stable. Patient is following medication instructions. Omni BID OS. The patient experiences no increase floaters. The patient complains of difficulty when driving at night, which began 1 year ago. The right eye is affected. The episode is gradual. Symptoms occur when the patient is driving. This negatively affecting her quality of life. HPI Completed by Dr. Baruch Goldmann  Medical History: Cataracts Arthritis High Blood Pressure LDL  Review of Systems Negative Allergic/Immunologic Negative Cardiovascular Negative Constitutional Negative Ear, Nose, Mouth & Throat Negative Endocrine Negative Eyes Negative Gastrointestinal Negative Genitourinary Negative Hemotologic/Lymphatic Negative Integumentary Negative Musculoskeletal Negative Neurological Negative Psychiatry Negative Respiratory  Social   Never smoked   Medication Prednisolone-gatiflox-bromfenac,  Bisoprolol-HCTZ, Furosemide, Simvastatin, Aspirin, Vitamin D3,   Sx/Procedures Phaco c IOL OS,  Right knee replacement, Hysterectomy,   Drug Allergies  Tetracycline,   History & Physical: Heent:  Cataract, Right eye NECK: supple without bruits LUNGS: lungs clear to auscultation CV: regular rate and rhythm Abdomen: soft and non-tender  Impression & Plan: Assessment: 1.  CATARACT EXTRACTION STATUS; Left Eye (Z98.42) 2.  INTRAOCULAR LENS IOL (Z96.1) 3.  COMBINED FORMS AGE RELATED  CATARACT; Both Eyes (H25.813) 4.  BLEPHARITIS; Right Upper Lid, Right Lower Lid, Left Upper Lid, Left Lower Lid (H01.001, H01.002,H01.004,H01.005)  Plan: 1.  1 week after cataract surgery. Doing well with improved vision and normal eye pressure. Call with any problems or concerns. Continue Gati-Brom-Pred 2x/day for 3 more weeks. 2.  Doing well since surgery Continue Post-op medications 3.  Cataract accounts for the patient's decreased vision. This visual impairment is not correctable with a tolerable change in glasses or contact lenses. Cataract surgery with an implantation of a new lens should significantly improve the visual and functional status of the patient. Discussed all risks, benefits, alternatives, and potential complications. Discussed the procedures and recovery. Patient desires to have surgery. A-scan ordered and performed today for intra-ocular lens calculations. The surgery will be performed in order to improve vision for driving, reading, and for eye examinations. Recommend phacoemulsification with intra-ocular lens. Left Eye functional eye - first. Dilates well - shugarcaine by protocol. Right Eye - Goal -2.50. 4.  Begin/continue lid scrubs.

## 2020-05-14 ENCOUNTER — Other Ambulatory Visit: Payer: Self-pay

## 2020-05-14 ENCOUNTER — Encounter (HOSPITAL_COMMUNITY)
Admission: RE | Admit: 2020-05-14 | Discharge: 2020-05-14 | Disposition: A | Discharge status: Home / Self Care | Payer: Medicare Other | Source: Ambulatory Visit | Attending: Ophthalmology | Admitting: Ophthalmology

## 2020-05-15 ENCOUNTER — Other Ambulatory Visit (HOSPITAL_COMMUNITY)
Admission: RE | Admit: 2020-05-15 | Discharge: 2020-05-15 | Disposition: A | Discharge status: Home / Self Care | Payer: Medicare Other | Source: Ambulatory Visit | Attending: Ophthalmology | Admitting: Ophthalmology

## 2020-05-15 ENCOUNTER — Other Ambulatory Visit: Payer: Self-pay

## 2020-05-15 DIAGNOSIS — Z20822 Contact with and (suspected) exposure to covid-19: Secondary | ICD-10-CM | POA: Diagnosis not present

## 2020-05-15 DIAGNOSIS — Z01812 Encounter for preprocedural laboratory examination: Secondary | ICD-10-CM | POA: Insufficient documentation

## 2020-05-16 LAB — SARS CORONAVIRUS 2 (TAT 6-24 HRS): SARS Coronavirus 2: NEGATIVE

## 2020-05-18 ENCOUNTER — Encounter (HOSPITAL_COMMUNITY)
Admission: RE | Disposition: A | Discharge status: Home / Self Care | Payer: Self-pay | Source: Home / Self Care | Attending: Ophthalmology

## 2020-05-18 ENCOUNTER — Ambulatory Visit (HOSPITAL_COMMUNITY): Payer: Medicare Other | Admitting: Anesthesiology

## 2020-05-18 ENCOUNTER — Ambulatory Visit (HOSPITAL_COMMUNITY)
Admission: RE | Admit: 2020-05-18 | Discharge: 2020-05-18 | Disposition: A | Discharge status: Home / Self Care | Payer: Medicare Other | Attending: Ophthalmology | Admitting: Ophthalmology

## 2020-05-18 DIAGNOSIS — Z961 Presence of intraocular lens: Secondary | ICD-10-CM | POA: Diagnosis not present

## 2020-05-18 DIAGNOSIS — Z9842 Cataract extraction status, left eye: Secondary | ICD-10-CM | POA: Insufficient documentation

## 2020-05-18 DIAGNOSIS — Z7982 Long term (current) use of aspirin: Secondary | ICD-10-CM | POA: Insufficient documentation

## 2020-05-18 DIAGNOSIS — H25811 Combined forms of age-related cataract, right eye: Secondary | ICD-10-CM | POA: Insufficient documentation

## 2020-05-18 DIAGNOSIS — Z79899 Other long term (current) drug therapy: Secondary | ICD-10-CM | POA: Diagnosis not present

## 2020-05-18 DIAGNOSIS — Z96651 Presence of right artificial knee joint: Secondary | ICD-10-CM | POA: Diagnosis not present

## 2020-05-18 HISTORY — PX: CATARACT EXTRACTION W/PHACO: SHX586

## 2020-05-18 SURGERY — PHACOEMULSIFICATION, CATARACT, WITH IOL INSERTION
Anesthesia: Monitor Anesthesia Care | Site: Eye | Laterality: Right

## 2020-05-18 MED ORDER — EPINEPHRINE PF 1 MG/ML IJ SOLN
INTRAOCULAR | Status: DC | PRN
  Administered 2020-05-18: 500 mL

## 2020-05-18 MED ORDER — CYCLOPENTOLATE-PHENYLEPHRINE 0.2-1 % OP SOLN
1.0000 [drp] | OPHTHALMIC | Status: AC | PRN
  Administered 2020-05-18 (×3): 1 [drp] via OPHTHALMIC

## 2020-05-18 MED ORDER — SODIUM HYALURONATE 23 MG/ML IO SOLN
INTRAOCULAR | Status: DC | PRN
  Administered 2020-05-18: 0.6 mL via INTRAOCULAR

## 2020-05-18 MED ORDER — POVIDONE-IODINE 5 % OP SOLN
OPHTHALMIC | Status: DC | PRN
  Administered 2020-05-18: 1 via OPHTHALMIC

## 2020-05-18 MED ORDER — TETRACAINE HCL 0.5 % OP SOLN
1.0000 [drp] | OPHTHALMIC | Status: AC | PRN
  Administered 2020-05-18 (×3): 1 [drp] via OPHTHALMIC

## 2020-05-18 MED ORDER — LIDOCAINE HCL (PF) 1 % IJ SOLN
INTRAOCULAR | Status: DC | PRN
  Administered 2020-05-18: 1 mL via OPHTHALMIC

## 2020-05-18 MED ORDER — PHENYLEPHRINE HCL 2.5 % OP SOLN
1.0000 [drp] | OPHTHALMIC | Status: AC | PRN
  Administered 2020-05-18 (×3): 1 [drp] via OPHTHALMIC

## 2020-05-18 MED ORDER — BSS IO SOLN
INTRAOCULAR | Status: DC | PRN
  Administered 2020-05-18: 15 mL via INTRAOCULAR

## 2020-05-18 MED ORDER — LIDOCAINE HCL 3.5 % OP GEL
1.0000 "application " | Freq: Once | OPHTHALMIC | Status: AC
  Administered 2020-05-18: 1 via OPHTHALMIC

## 2020-05-18 MED ORDER — NEOMYCIN-POLYMYXIN-DEXAMETH 3.5-10000-0.1 OP SUSP
OPHTHALMIC | Status: DC | PRN
  Administered 2020-05-18: 1 [drp] via OPHTHALMIC

## 2020-05-18 MED ORDER — PROVISC 10 MG/ML IO SOLN
INTRAOCULAR | Status: DC | PRN
  Administered 2020-05-18: 0.85 mL via INTRAOCULAR

## 2020-05-18 MED ORDER — EPINEPHRINE PF 1 MG/ML IJ SOLN
INTRAMUSCULAR | Status: AC
  Filled 2020-05-18: qty 2

## 2020-05-18 MED ORDER — STERILE WATER FOR IRRIGATION IR SOLN
Status: DC | PRN
  Administered 2020-05-18: 250 mL

## 2020-05-18 SURGICAL SUPPLY — 23 items
CLOTH BEACON ORANGE TIMEOUT ST (SAFETY) ×3 IMPLANT
DEVICE MILOOP (MISCELLANEOUS) IMPLANT
EYE SHIELD UNIVERSAL CLEAR (GAUZE/BANDAGES/DRESSINGS) ×2 IMPLANT
GLOVE BIOGEL PI IND STRL 6.5 (GLOVE) IMPLANT
GLOVE BIOGEL PI IND STRL 7.0 (GLOVE) IMPLANT
GLOVE BIOGEL PI IND STRL 7.5 (GLOVE) IMPLANT
GLOVE BIOGEL PI INDICATOR 6.5 (GLOVE)
GLOVE BIOGEL PI INDICATOR 7.0 (GLOVE) ×2
GLOVE BIOGEL PI INDICATOR 7.5 (GLOVE) ×2
GOWN STRL REUS W/ TWL XL LVL3 (GOWN DISPOSABLE) IMPLANT
GOWN STRL REUS W/TWL XL LVL3 (GOWN DISPOSABLE) ×3
MILOOP DEVICE (MISCELLANEOUS)
NDL HYPO 18GX1.5 BLUNT FILL (NEEDLE) IMPLANT
NEEDLE HYPO 18GX1.5 BLUNT FILL (NEEDLE) ×3 IMPLANT
PAD ARMBOARD 7.5X6 YLW CONV (MISCELLANEOUS) ×3 IMPLANT
RING MALYGIN (MISCELLANEOUS) IMPLANT
RING MALYGIN 7.0 (MISCELLANEOUS) IMPLANT
SYR TB 1ML LL NO SAFETY (SYRINGE) ×2 IMPLANT
TAPE SURG TRANSPORE 1 IN (GAUZE/BANDAGES/DRESSINGS) ×1 IMPLANT
TAPE SURGICAL TRANSPORE 1 IN (GAUZE/BANDAGES/DRESSINGS) ×3
TECNIS 1-PIECE IOL (Intraocular Lens) ×2 IMPLANT
VISCOELASTIC ADDITIONAL (OPHTHALMIC RELATED) IMPLANT
WATER STERILE IRR 250ML POUR (IV SOLUTION) ×3 IMPLANT

## 2020-05-18 NOTE — Op Note (Signed)
Date of procedure: 05/18/20  Pre-operative diagnosis:  Visually significant combined form age-related cataract, Right Eye (H25.811)  Post-operative diagnosis:  Visually significant combined form age-related cataract, Right Eye (H25.811)  Procedure: Removal of cataract via phacoemulsification and insertion of intra-ocular lens Wynetta Emery and [employer] [id number]  +28.5D into the capsular bag of the Right Eye  Attending surgeon: Gerda Diss. Abeni Finchum, MD, MA  Anesthesia: MAC, Topical Akten  Complications: None  Estimated Blood Loss: <27m (minimal)  Specimens: None  Implants: As above  Indications:  Visually significant age-related cataract, Right Eye  Procedure:  The patient was seen and identified in the pre-operative area. The operative eye was identified and dilated.  The operative eye was marked.  Topical anesthesia was administered to the operative eye.     The patient was then to the operative suite and placed in the supine position.  A timeout was performed confirming the patient, procedure to be performed, and all other relevant information.   The patient's face was prepped and draped in the usual fashion for intra-ocular surgery.  A lid speculum was placed into the operative eye and the surgical microscope moved into place and focused.  A superotemporal paracentesis was created using a 20 gauge paracentesis blade.  Shugarcaine was injected into the anterior chamber.  Viscoelastic was injected into the anterior chamber.  A temporal clear-corneal main wound incision was created using a 2.451mmicrokeratome.  A continuous curvilinear capsulorrhexis was initiated using an irrigating cystitome and completed using capsulorrhexis forceps.  Hydrodissection and hydrodeliniation were performed.  Viscoelastic was injected into the anterior chamber.  A phacoemulsification handpiece and a chopper as a second instrument were used to remove the nucleus and epinucleus. The irrigation/aspiration handpiece was  used to remove any remaining cortical material.   The capsular bag was reinflated with viscoelastic, checked, and found to be intact.  The intraocular lens was inserted into the capsular bag.  The irrigation/aspiration handpiece was used to remove any remaining viscoelastic.  The clear corneal wound and paracentesis wounds were then hydrated and checked with Weck-Cels to be watertight.  The lid-speculum was removed.  The drape was removed.  The patient's face was cleaned with a wet and dry 4x4.   Maxitrol was instilled in the eye. A clear shield was taped over the eye. The patient was taken to the post-operative care unit in good condition, having tolerated the procedure well.  Post-Op Instructions: [facility] and will receive all other orders and instructions.

## 2020-05-18 NOTE — Anesthesia Procedure Notes (Signed)
Procedure Name: MAC Date/Time: 05/18/2020 8:41 AM Performed by: Orlie Dakin, CRNA Pre-anesthesia Checklist: Patient identified, Emergency Drugs available, Suction available and Patient being monitored Patient Re-evaluated:Patient Re-evaluated prior to induction Oxygen Delivery Method: Nasal cannula Placement Confirmation: positive ETCO2

## 2020-05-18 NOTE — Discharge Instructions (Signed)
Please discharge patient when stable, will follow up today with Dr. Anny Sayler at the [facility] [place] office immediately following discharge.  Leave shield in place until visit.  All paperwork with discharge instructions will be given at the office.   [facility] [place] Address:  [address]  [place], [place] [address]  

## 2020-05-18 NOTE — Transfer of Care (Signed)
Immediate Anesthesia Transfer of Care Note  Patient: Leslie Barrera  Procedure(s) Performed: CATARACT EXTRACTION PHACO AND INTRAOCULAR LENS PLACEMENT RIGHT EYE (Right Eye)  Patient Location: Short Stay  Anesthesia Type:MAC  Level of Consciousness: awake, alert  and oriented  Airway & Oxygen Therapy: Patient Spontanous Breathing  Post-op Assessment: Report given to RN and Post -op Vital signs reviewed and stable  Post vital signs: Reviewed and stable  Last Vitals:  Vitals Value Taken Time  BP 180/76 05/18/20 0859  Temp 36.5 C 05/18/20 0859  Pulse 55 05/18/20 0859  Resp 18 05/18/20 0859  SpO2 96 % 05/18/20 0859    Last Pain:  Vitals:   05/18/20 0859  TempSrc: Oral  PainSc: 0-No pain      Patients Stated Pain Goal: 8 (40/35/24 8185)  Complications: No complications documented.

## 2020-05-18 NOTE — Anesthesia Postprocedure Evaluation (Signed)
Anesthesia Post Note  Patient: Leslie Barrera  Procedure(s) Performed: CATARACT EXTRACTION PHACO AND INTRAOCULAR LENS PLACEMENT RIGHT EYE (Right Eye)  Patient location during evaluation: Phase II Anesthesia Type: MAC Level of consciousness: awake and alert and oriented Pain management: pain level controlled Vital Signs Assessment: post-procedure vital signs reviewed and stable Respiratory status: spontaneous breathing, nonlabored ventilation and respiratory function stable Cardiovascular status: blood pressure returned to baseline and stable Postop Assessment: no apparent nausea or vomiting Anesthetic complications: no   No complications documented.   Last Vitals:  Vitals:   05/18/20 0800 05/18/20 0859  BP: (!) 156/72 (!) 180/76  Pulse: (!) 44 (!) 55  Resp: 16 18  Temp:  36.5 C  SpO2: 97% 96%    Last Pain:  Vitals:   05/18/20 0859  TempSrc: Oral  PainSc: 0-No pain                 Orlie Dakin

## 2020-05-18 NOTE — Anesthesia Preprocedure Evaluation (Signed)
Anesthesia Evaluation  Patient identified by MRN, date of birth, ID band Patient awake    Reviewed: Allergy & Precautions, NPO status , Patient's Chart, lab work & pertinent test results  History of Anesthesia Complications Negative for: history of anesthetic complications  Airway Mallampati: II  TM Distance: >3 FB Neck ROM: Full    Dental  (+) Partial Upper, Dental Advisory Given   Pulmonary neg pulmonary ROS,    Pulmonary exam normal breath sounds clear to auscultation       Cardiovascular Exercise Tolerance: Good hypertension, Pt. on medications Normal cardiovascular exam Rhythm:Regular Rate:Normal     Neuro/Psych negative neurological ROS  negative psych ROS   GI/Hepatic negative GI ROS, Neg liver ROS,   Endo/Other  negative endocrine ROS  Renal/GU negative Renal ROS  negative genitourinary   Musculoskeletal negative musculoskeletal ROS (+)   Abdominal   Peds  Hematology negative hematology ROS (+)   Anesthesia Other Findings   Reproductive/Obstetrics negative OB ROS                             Anesthesia Physical  Anesthesia Plan  ASA: II  Anesthesia Plan: MAC   Post-op Pain Management:    Induction:   PONV Risk Score and Plan:   Airway Management Planned: Nasal Cannula and Natural Airway  Additional Equipment:   Intra-op Plan:   Post-operative Plan:   Informed Consent: I have reviewed the patients History and Physical, chart, labs and discussed the procedure including the risks, benefits and alternatives for the proposed anesthesia with the patient or authorized representative who has indicated his/her understanding and acceptance.     Dental advisory given  Plan Discussed with: CRNA and Surgeon  Anesthesia Plan Comments:         Anesthesia Quick Evaluation

## 2020-05-18 NOTE — Interval H&P Note (Signed)
History and Physical Interval Note:  05/18/2020 8:34 AM  Leslie Barrera  has presented today for surgery, with the diagnosis of Nuclear sclerotic cataract - Right eye.  The various methods of treatment have been discussed with the patient and family. After consideration of risks, benefits and other options for treatment, the patient has consented to  Procedure(s) with comments: CATARACT EXTRACTION PHACO AND INTRAOCULAR LENS PLACEMENT RIGHT EYE (Right) - right as a surgical intervention.  The patient's history has been reviewed, patient examined, no change in status, stable for surgery.  I have reviewed the patient's chart and labs.  Questions were answered to the patient's satisfaction.     Baruch Goldmann

## 2020-05-19 ENCOUNTER — Encounter (HOSPITAL_COMMUNITY): Payer: Self-pay | Admitting: Ophthalmology

## 2020-05-26 DIAGNOSIS — I10 Essential (primary) hypertension: Secondary | ICD-10-CM | POA: Diagnosis not present

## 2020-05-26 DIAGNOSIS — E7849 Other hyperlipidemia: Secondary | ICD-10-CM | POA: Diagnosis not present

## 2020-05-26 DIAGNOSIS — R7309 Other abnormal glucose: Secondary | ICD-10-CM | POA: Diagnosis not present

## 2020-06-23 ENCOUNTER — Ambulatory Visit: Payer: Medicare Other | Admitting: Obstetrics & Gynecology

## 2020-07-07 ENCOUNTER — Ambulatory Visit: Payer: Medicare Other | Admitting: Obstetrics & Gynecology

## 2020-07-07 ENCOUNTER — Other Ambulatory Visit: Payer: Self-pay

## 2020-07-07 ENCOUNTER — Encounter: Payer: Self-pay | Admitting: Obstetrics & Gynecology

## 2020-07-07 VITALS — BP 177/74 | HR 53 | Ht 66.5 in

## 2020-07-07 DIAGNOSIS — Z4689 Encounter for fitting and adjustment of other specified devices: Secondary | ICD-10-CM

## 2020-07-07 DIAGNOSIS — N993 Prolapse of vaginal vault after hysterectomy: Secondary | ICD-10-CM | POA: Diagnosis not present

## 2020-07-07 NOTE — Progress Notes (Signed)
Chief Complaint  Patient presents with  . Pessary Check    Blood pressure (!) 177/74, pulse (!) 53, height 5' 6.5" (1.689 m).  Leslie Barrera presents today for routine follow up related to her pessary.   She uses a Milex ring with support #4 She reports no vaginal discharge and no vaginal bleeding   Likert scale(1 not bothersome -5 very bothersome)  :  1  Exam reveals no undue vaginal mucosal pressure of breakdown, no discharge and no vaginal bleeding.  Vaginal Epithelial Abnormality Classification System:   0 0    No abnormalities 1    Epithelial erythema 2    Granulation tissue 3    Epithelial break or erosion, 1 cm or less 4    Epithelial break or erosion, 1 cm or greater  The pessary is removed, cleaned and replaced without difficulty.    No diagnosis found.   [name] will be sen back in 4 months for continued follow up.  Florian Buff, MD  07/07/2020 9:59 AM

## 2020-09-23 DIAGNOSIS — I10 Essential (primary) hypertension: Secondary | ICD-10-CM | POA: Diagnosis not present

## 2020-09-23 DIAGNOSIS — E7849 Other hyperlipidemia: Secondary | ICD-10-CM | POA: Diagnosis not present

## 2020-09-28 DIAGNOSIS — R946 Abnormal results of thyroid function studies: Secondary | ICD-10-CM | POA: Diagnosis not present

## 2020-09-28 DIAGNOSIS — E119 Type 2 diabetes mellitus without complications: Secondary | ICD-10-CM | POA: Diagnosis not present

## 2020-09-28 DIAGNOSIS — I10 Essential (primary) hypertension: Secondary | ICD-10-CM | POA: Diagnosis not present

## 2020-09-28 DIAGNOSIS — E559 Vitamin D deficiency, unspecified: Secondary | ICD-10-CM | POA: Diagnosis not present

## 2020-09-28 DIAGNOSIS — Z1389 Encounter for screening for other disorder: Secondary | ICD-10-CM | POA: Diagnosis not present

## 2020-09-28 DIAGNOSIS — E782 Mixed hyperlipidemia: Secondary | ICD-10-CM | POA: Diagnosis not present

## 2020-09-28 DIAGNOSIS — E785 Hyperlipidemia, unspecified: Secondary | ICD-10-CM | POA: Diagnosis not present

## 2020-09-28 DIAGNOSIS — Z0001 Encounter for general adult medical examination with abnormal findings: Secondary | ICD-10-CM | POA: Diagnosis not present

## 2020-10-24 DIAGNOSIS — E7849 Other hyperlipidemia: Secondary | ICD-10-CM | POA: Diagnosis not present

## 2020-10-24 DIAGNOSIS — I10 Essential (primary) hypertension: Secondary | ICD-10-CM | POA: Diagnosis not present

## 2020-11-05 ENCOUNTER — Encounter: Payer: Self-pay | Admitting: Obstetrics & Gynecology

## 2020-11-05 ENCOUNTER — Ambulatory Visit: Payer: Medicare Other | Admitting: Obstetrics & Gynecology

## 2020-11-05 ENCOUNTER — Other Ambulatory Visit: Payer: Self-pay

## 2020-11-05 VITALS — BP 143/71 | HR 48 | Ht 66.0 in | Wt 188.0 lb

## 2020-11-05 DIAGNOSIS — Z4689 Encounter for fitting and adjustment of other specified devices: Secondary | ICD-10-CM | POA: Diagnosis not present

## 2020-11-05 DIAGNOSIS — N993 Prolapse of vaginal vault after hysterectomy: Secondary | ICD-10-CM | POA: Diagnosis not present

## 2020-11-05 NOTE — Progress Notes (Signed)
Chief Complaint  Patient presents with  . Pessary Check    Blood pressure (!) 143/71, pulse (!) 48, height 5\' 6"  (1.676 m), weight 188 lb (85.3 kg).  Leslie Barrera presents today for routine follow up related to her pessary.   She uses a Milex ring with support #4 She reports no vaginal discharge and no vaginal bleeding   Likert scale(1 not bothersome -5 very bothersome)  :  1  Exam reveals no undue vaginal mucosal pressure of breakdown, no discharge and no vaginal bleeding.  Vaginal Epithelial Abnormality Classification System:   0 0    No abnormalities 1    Epithelial erythema 2    Granulation tissue 3    Epithelial break or erosion, 1 cm or less 4    Epithelial break or erosion, 1 cm or greater  The pessary is removed, cleaned and replaced without difficulty.      ICD-10-CM   1. Pessary maintenance, Milex ring with support #4, original fit 04/2018  Z46.89   2. Complete prolapse of vaginal vault s/p hysterectomy  N99.3      Leslie Barrera will be sen back in 4 months for continued follow up.  Florian Buff, MD  11/05/2020 9:53 AM

## 2020-11-24 DIAGNOSIS — I10 Essential (primary) hypertension: Secondary | ICD-10-CM | POA: Diagnosis not present

## 2020-11-24 DIAGNOSIS — E7849 Other hyperlipidemia: Secondary | ICD-10-CM | POA: Diagnosis not present

## 2020-12-07 ENCOUNTER — Other Ambulatory Visit (HOSPITAL_COMMUNITY): Payer: Self-pay | Admitting: Family Medicine

## 2020-12-07 DIAGNOSIS — E2839 Other primary ovarian failure: Secondary | ICD-10-CM

## 2020-12-07 DIAGNOSIS — Z1231 Encounter for screening mammogram for malignant neoplasm of breast: Secondary | ICD-10-CM

## 2020-12-14 ENCOUNTER — Ambulatory Visit (HOSPITAL_COMMUNITY): Payer: Medicare Other

## 2020-12-21 ENCOUNTER — Ambulatory Visit (HOSPITAL_COMMUNITY)
Admission: RE | Admit: 2020-12-21 | Discharge: 2020-12-21 | Disposition: A | Discharge status: Home / Self Care | Payer: Medicare Other | Source: Ambulatory Visit | Attending: Family Medicine | Admitting: Family Medicine

## 2020-12-21 ENCOUNTER — Other Ambulatory Visit: Payer: Self-pay

## 2020-12-21 DIAGNOSIS — Z780 Asymptomatic menopausal state: Secondary | ICD-10-CM | POA: Insufficient documentation

## 2020-12-21 DIAGNOSIS — E2839 Other primary ovarian failure: Secondary | ICD-10-CM | POA: Insufficient documentation

## 2020-12-21 DIAGNOSIS — Z1231 Encounter for screening mammogram for malignant neoplasm of breast: Secondary | ICD-10-CM | POA: Diagnosis not present

## 2020-12-21 IMAGING — MG MM DIGITAL SCREENING BILAT W/ TOMO AND CAD
6 of 12 series · 6 of 36 positions shown · non-contrast
Comparison: Previous exam(s).

CLINICAL DATA: Screening.

EXAM:
DIGITAL SCREENING BILATERAL MAMMOGRAM WITH TOMOSYNTHESIS AND CAD
TECHNIQUE: Bilateral screening digital craniocaudal and mediolateral oblique
mammograms were obtained. Bilateral screening digital breast
tomosynthesis was performed. The images were evaluated with
computer-aided detection.

[R MLO synth-2D (1 of 2)]
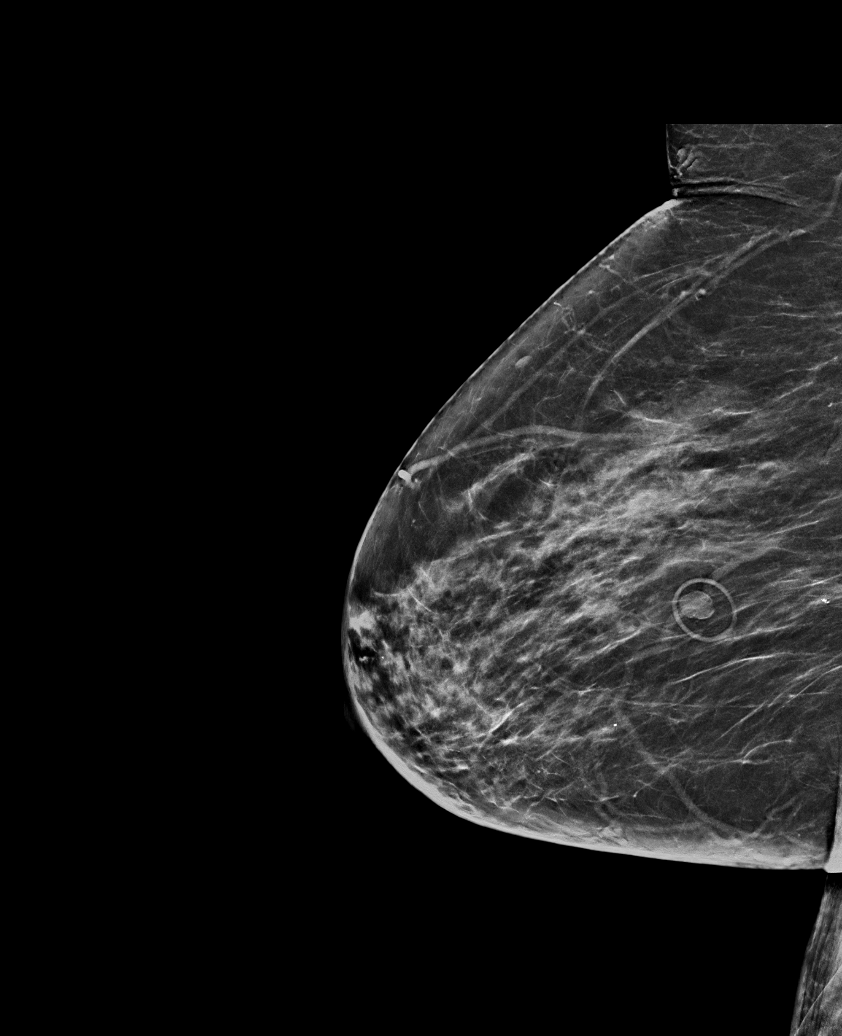

[L MLO synth-2D (1 of 2)]
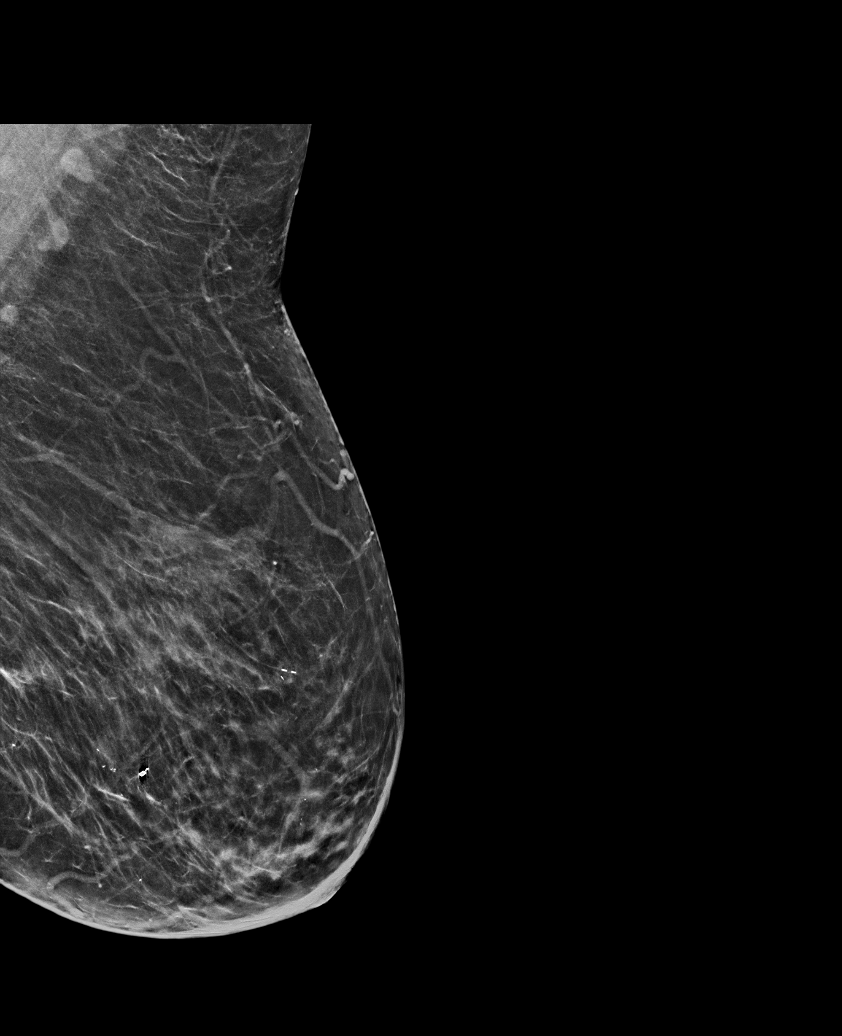

[R MLO synth-2D (2 of 2)]
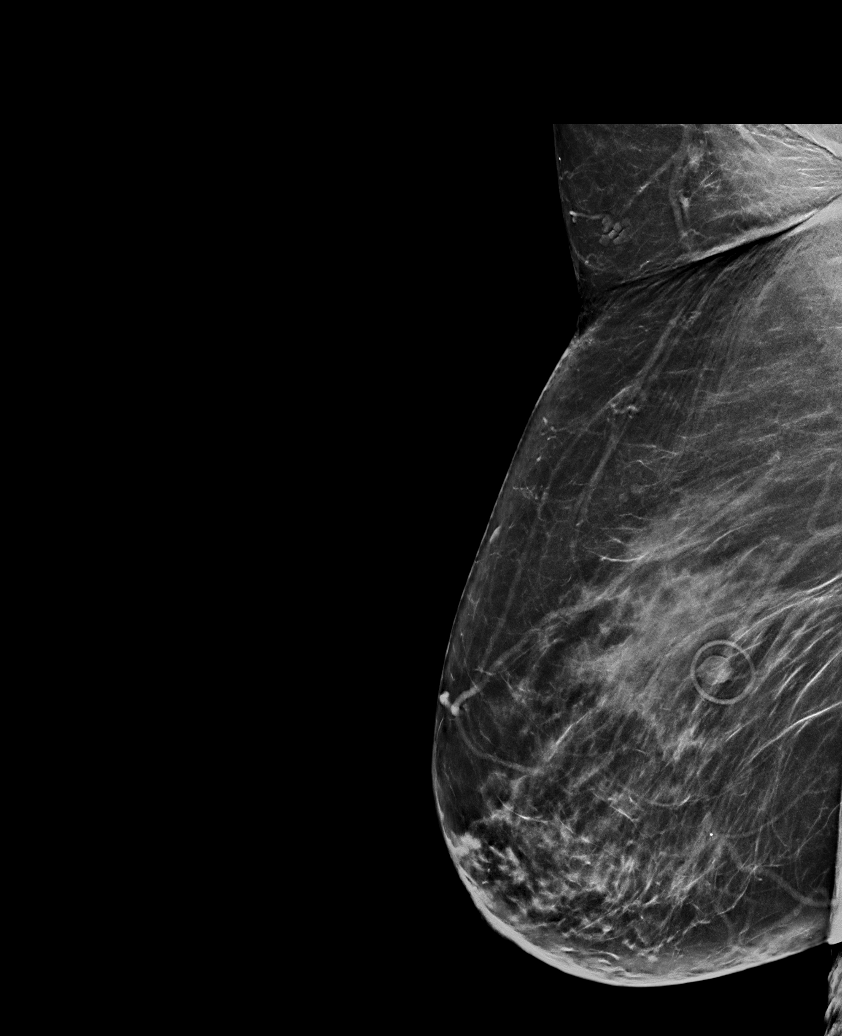

[L CC synth-2D]
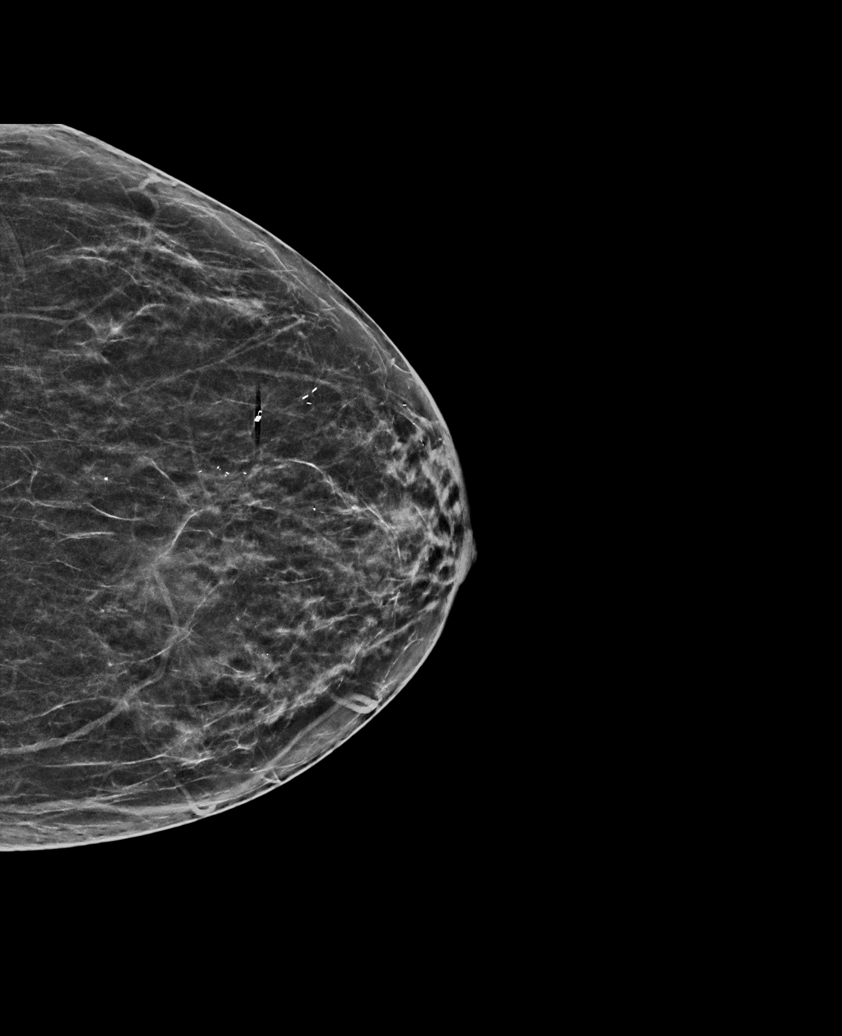

[L MLO synth-2D (2 of 2)]
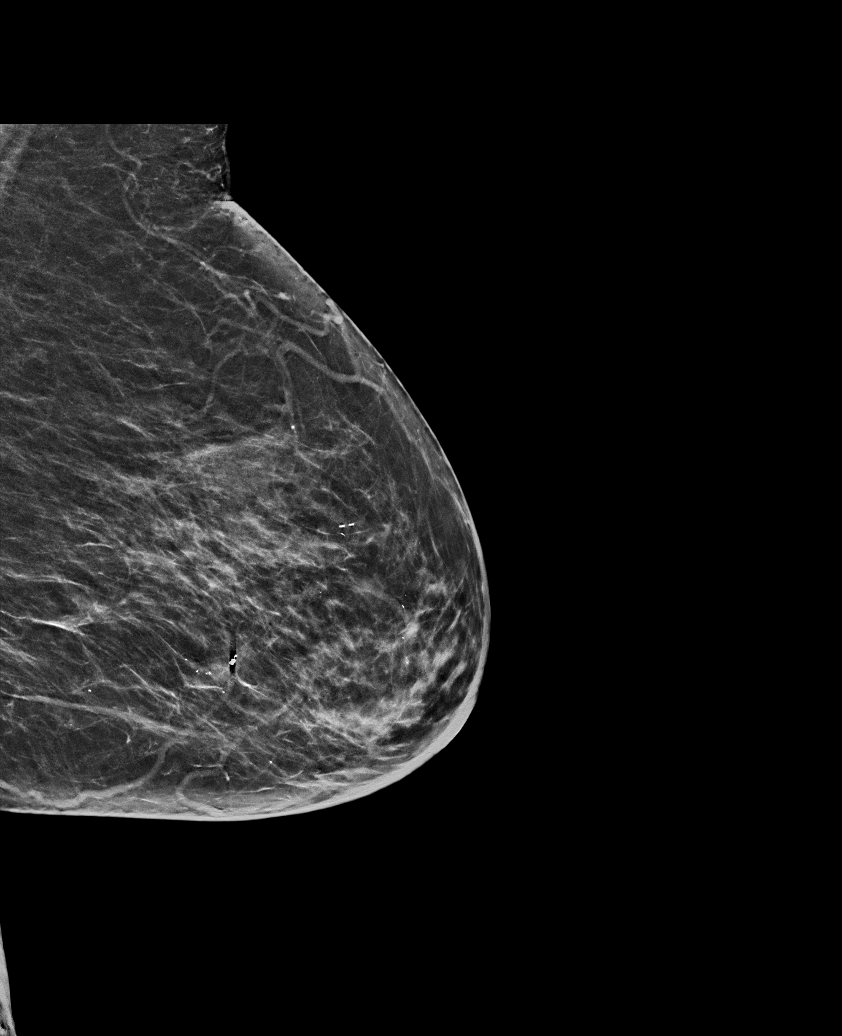

[R CC synth-2D]
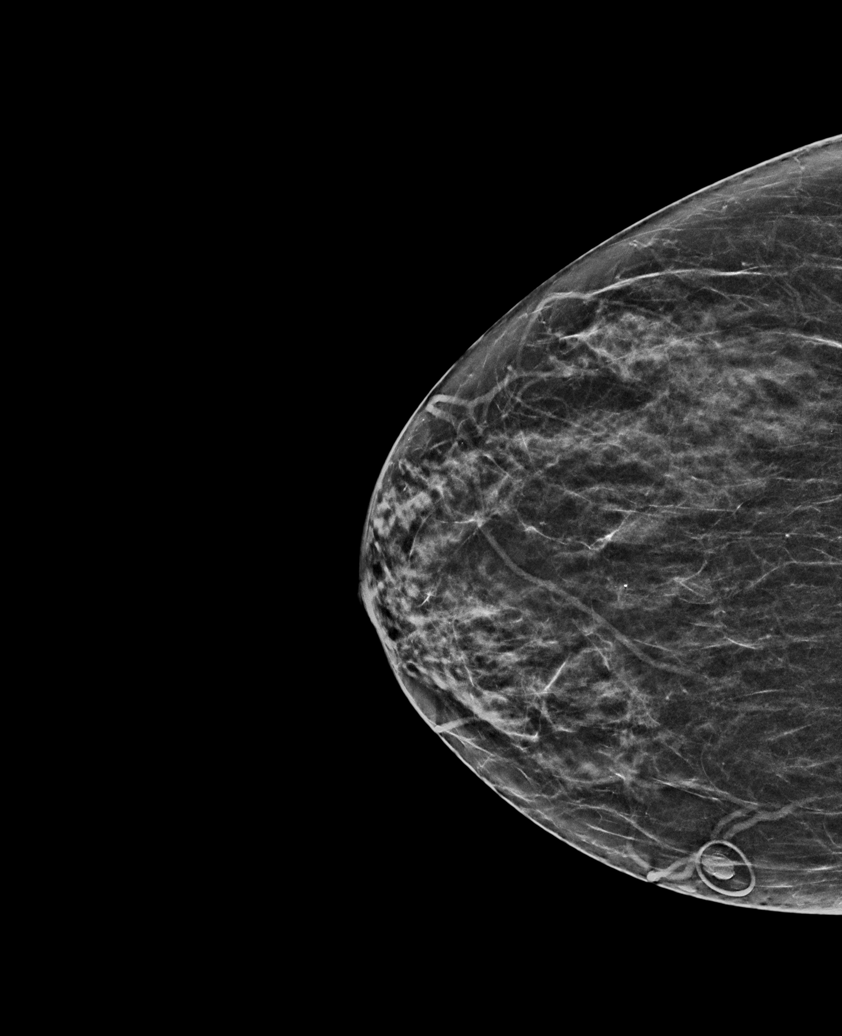

[6 of 36 positions shown; findings below may reference images not displayed]

ACR Breast Density Category c: The breast tissue is heterogeneously
dense, which may obscure small masses.
FINDINGS: In the left breast, possible distortion warrants further evaluation.
In the right breast, no findings suspicious for malignancy.
IMPRESSION: Further evaluation is suggested for possible distortion in the left
breast.

RECOMMENDATION:
Diagnostic mammogram and possibly ultrasound of the left breast.
(Code:[HF])

The patient will be contacted regarding the findings, and additional
imaging will be scheduled.

BI-RADS CATEGORY  0: Incomplete. Need additional imaging evaluation
and/or prior mammograms for comparison.

## 2020-12-23 ENCOUNTER — Other Ambulatory Visit (HOSPITAL_COMMUNITY): Payer: Self-pay | Admitting: Family Medicine

## 2020-12-23 DIAGNOSIS — R928 Other abnormal and inconclusive findings on diagnostic imaging of breast: Secondary | ICD-10-CM

## 2020-12-24 DIAGNOSIS — E782 Mixed hyperlipidemia: Secondary | ICD-10-CM | POA: Diagnosis not present

## 2020-12-24 DIAGNOSIS — I10 Essential (primary) hypertension: Secondary | ICD-10-CM | POA: Diagnosis not present

## 2020-12-25 DIAGNOSIS — C50919 Malignant neoplasm of unspecified site of unspecified female breast: Secondary | ICD-10-CM

## 2020-12-25 HISTORY — DX: Malignant neoplasm of unspecified site of unspecified female breast: C50.919

## 2020-12-25 HISTORY — PX: BREAST BIOPSY: SHX20

## 2020-12-30 ENCOUNTER — Other Ambulatory Visit (HOSPITAL_COMMUNITY): Payer: Self-pay | Admitting: Family Medicine

## 2020-12-30 ENCOUNTER — Other Ambulatory Visit: Payer: Self-pay

## 2020-12-30 ENCOUNTER — Ambulatory Visit (HOSPITAL_COMMUNITY)
Admission: RE | Admit: 2020-12-30 | Discharge: 2020-12-30 | Disposition: A | Discharge status: Home / Self Care | Payer: Medicare Other | Source: Ambulatory Visit | Attending: Family Medicine | Admitting: Family Medicine

## 2020-12-30 DIAGNOSIS — R928 Other abnormal and inconclusive findings on diagnostic imaging of breast: Secondary | ICD-10-CM

## 2020-12-30 DIAGNOSIS — R922 Inconclusive mammogram: Secondary | ICD-10-CM | POA: Diagnosis not present

## 2020-12-30 IMAGING — US US BREAST*L* LIMITED INC AXILLA
1 series · 1 of 1 positions shown · non-contrast
Comparison: Previous exam(s).

CLINICAL DATA: 79-year-old female for further evaluation of
possible LEFT breast distortion on screening mammogram.

EXAM:
DIGITAL DIAGNOSTIC UNILATERAL LEFT MAMMOGRAM WITH TOMOSYNTHESIS AND
CAD; ULTRASOUND LEFT BREAST LIMITED
TECHNIQUE: Left digital diagnostic mammography and breast tomosynthesis was
performed. The images were evaluated with computer-aided detection.;
Targeted ultrasound examination of the left breast was performed

[Series 1: us breast*left* limited inc axilla · 0.09mm/px · 1 of 1 slices shown]
[im 1/1]
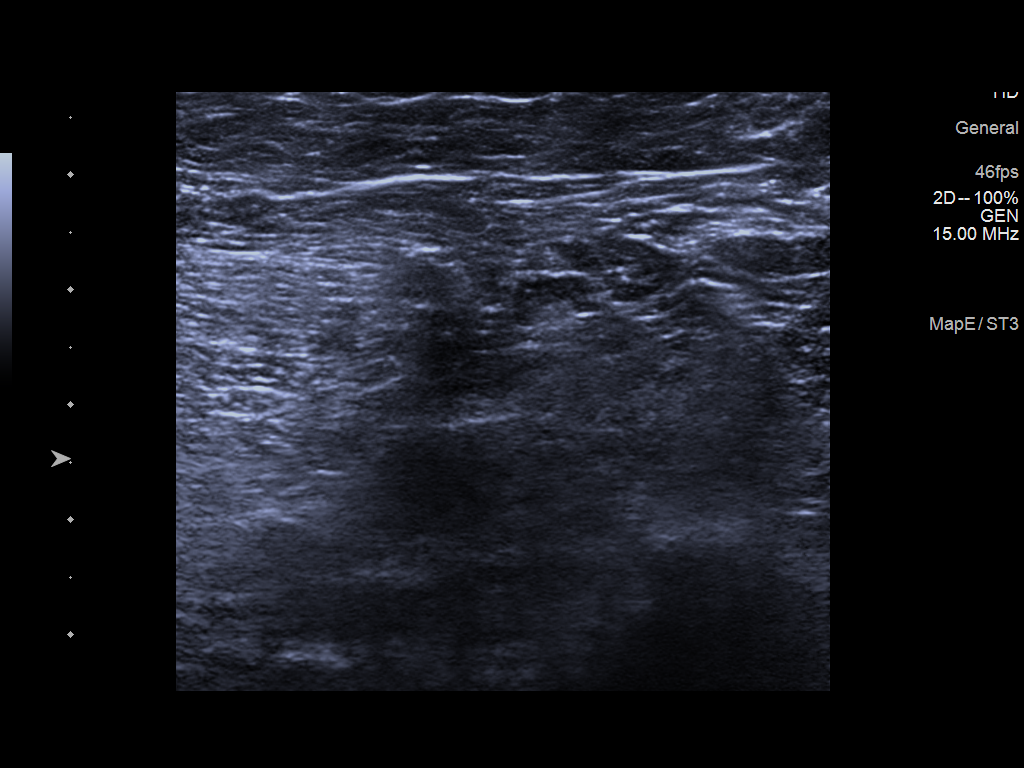

[1 of 1 positions shown; findings below may reference images not displayed]

ACR Breast Density Category b: There are scattered areas of
fibroglandular density.
FINDINGS: 2D/3D spot compression views of the LEFT breast demonstrate
persistent distortion within the OUTER LEFT breast.

Targeted ultrasound is performed, showing no sonographic correlate
to the mammographic LEFT breast distortion.

No abnormal LEFT axillary lymph nodes are noted.
IMPRESSION: 1. Persistent OUTER LEFT breast distortion without sonographic
correlate. Tissue sampling is recommended.
2. No abnormal appearing LEFT axillary lymph nodes.

RECOMMENDATION:
3D/stereotactic guided LEFT breast biopsy, which will be arranged.

I have discussed the findings and recommendations with the patient.
If applicable, a reminder letter will be sent to the patient
regarding the next appointment.

BI-RADS CATEGORY  4: Suspicious.

## 2020-12-30 IMAGING — MG MM DIGITAL DIAGNOSTIC UNILAT*L* W/ TOMO W/ CAD
4 series · 4 of 12 positions shown · non-contrast
Comparison: Previous exam(s).

CLINICAL DATA: 79-year-old female for further evaluation of
possible LEFT breast distortion on screening mammogram.

EXAM:
DIGITAL DIAGNOSTIC UNILATERAL LEFT MAMMOGRAM WITH TOMOSYNTHESIS AND
CAD; ULTRASOUND LEFT BREAST LIMITED
TECHNIQUE: Left digital diagnostic mammography and breast tomosynthesis was
performed. The images were evaluated with computer-aided detection.;
Targeted ultrasound examination of the left breast was performed

[L CC synth-2D]
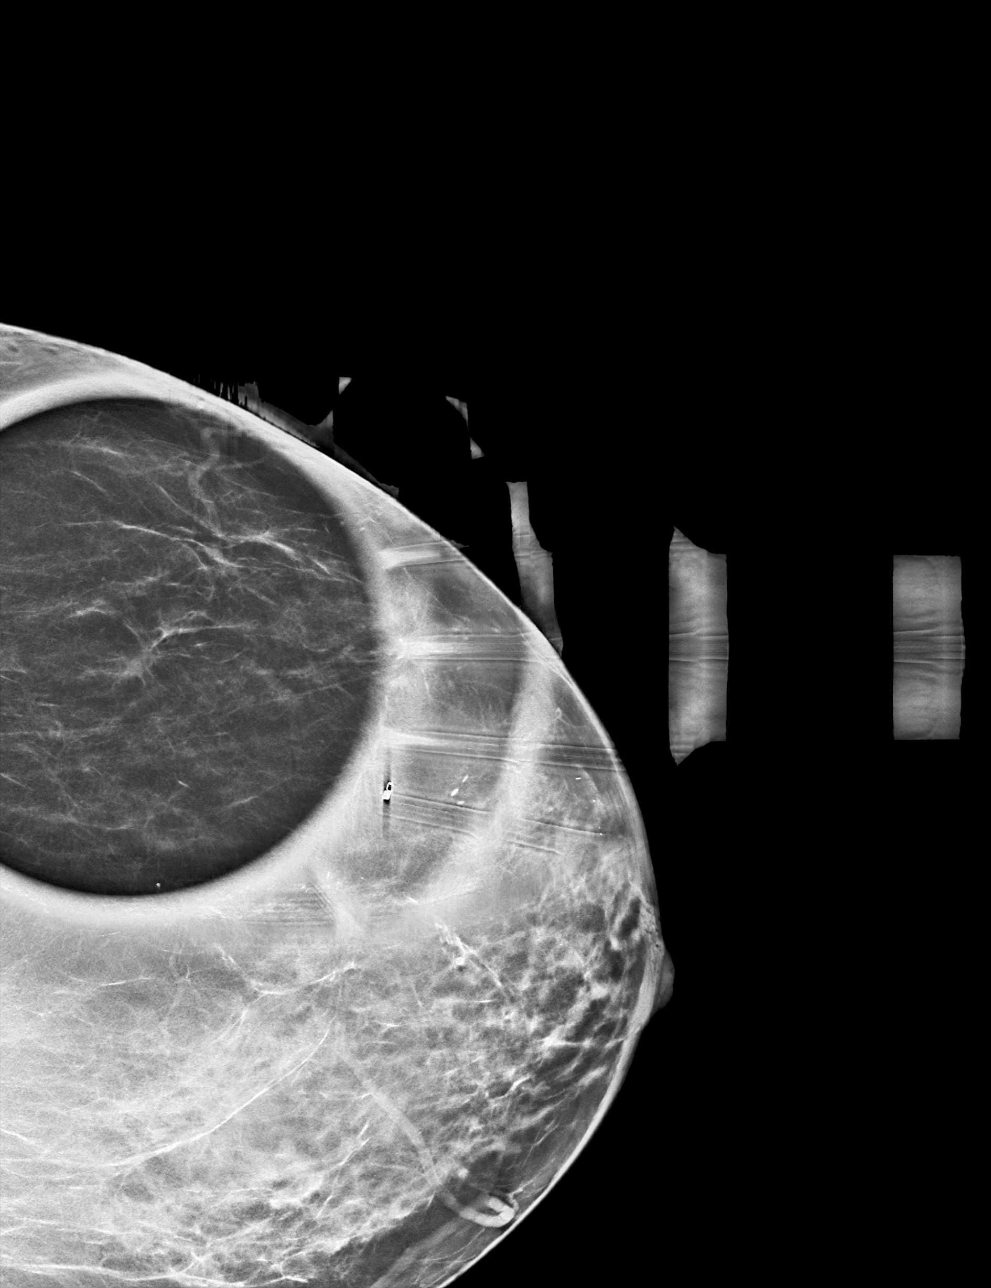

[L MLO synth-2D]
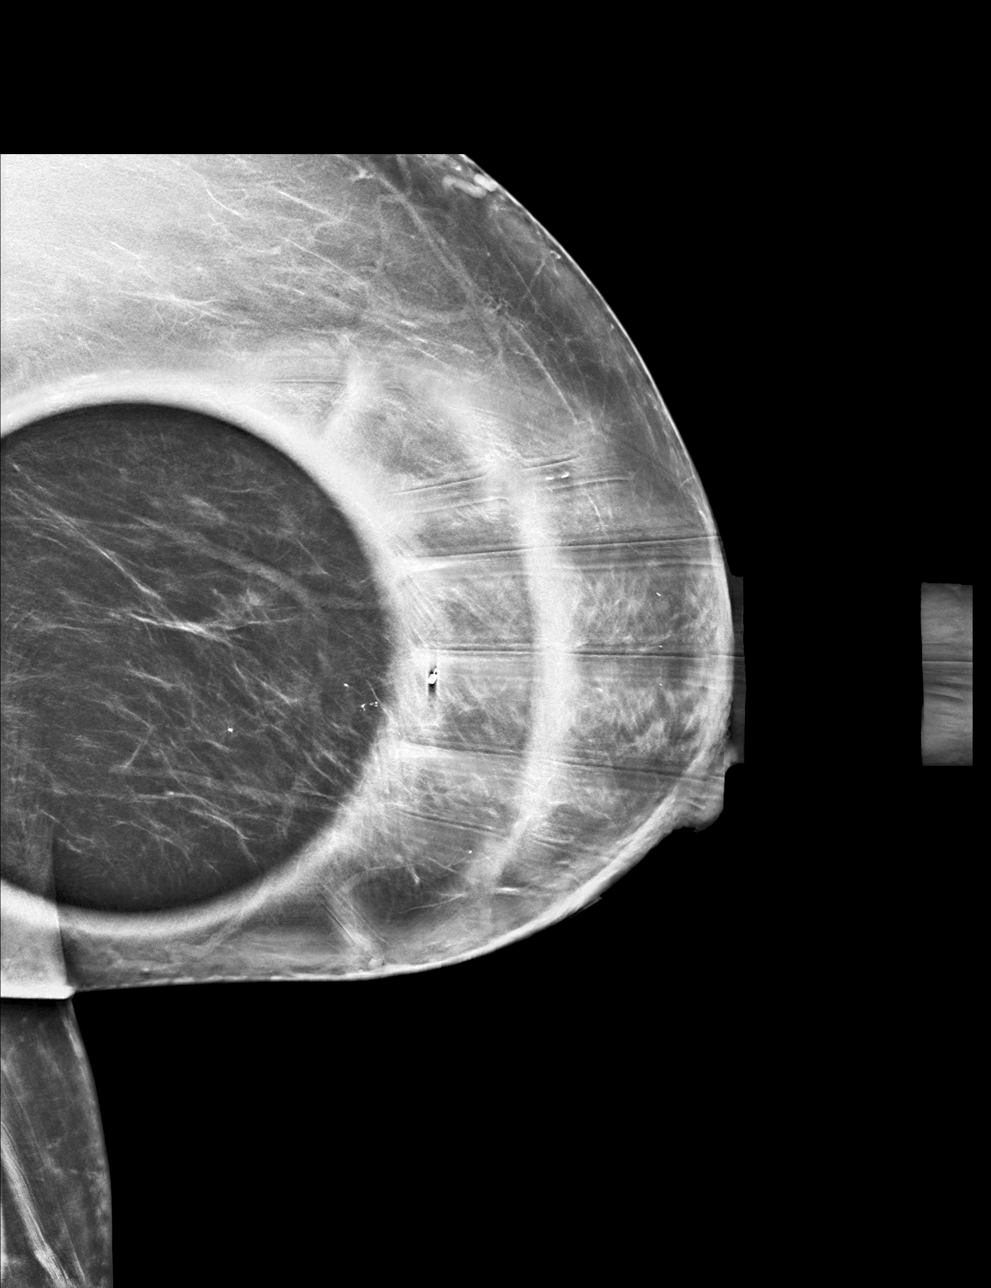

[L MLO tomo · tomo slice 32/63.0]
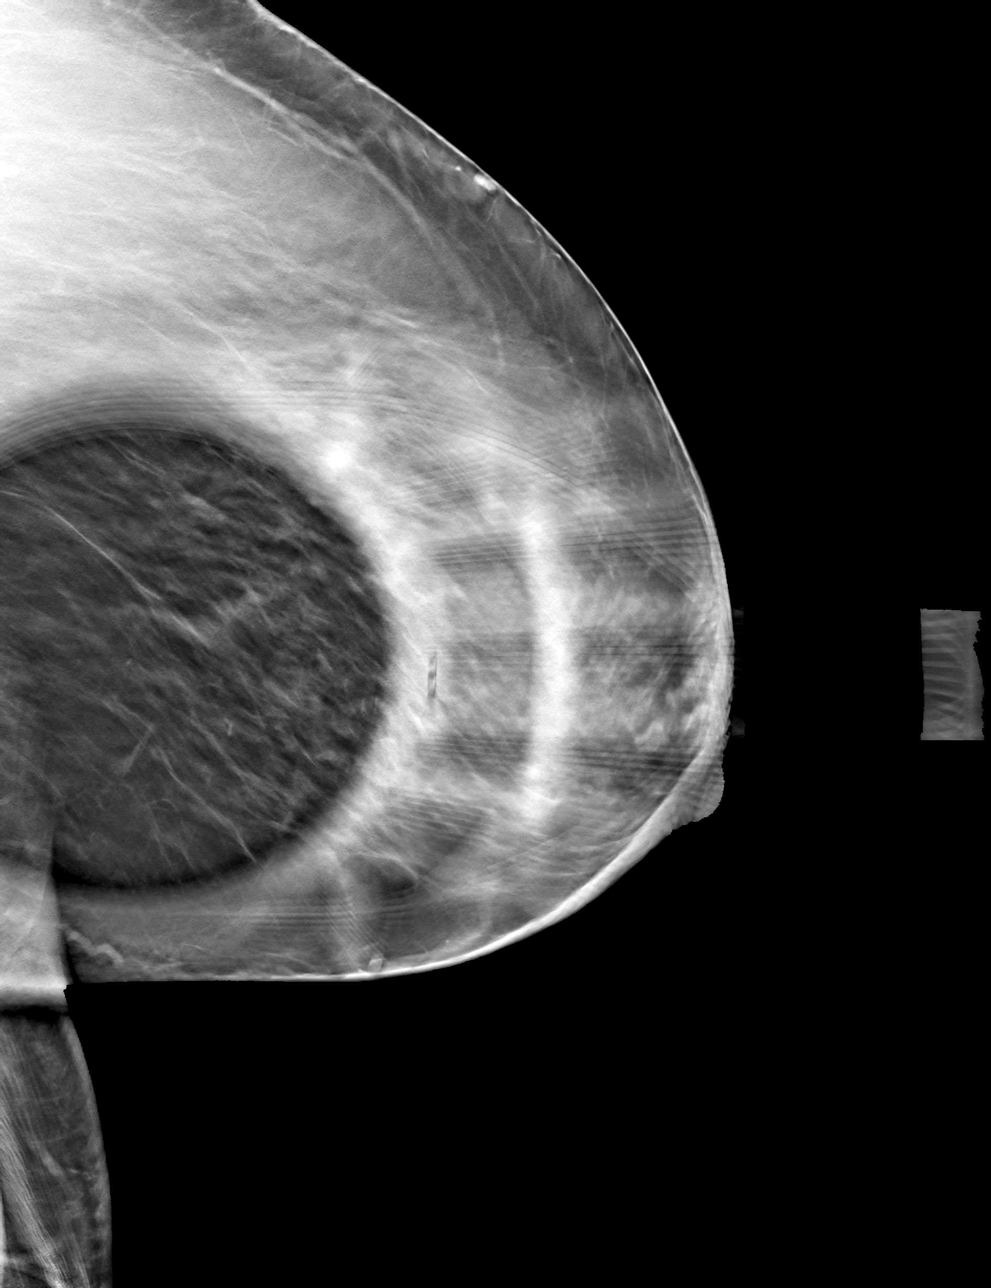

[L CC tomo · tomo slice 25/49.0]
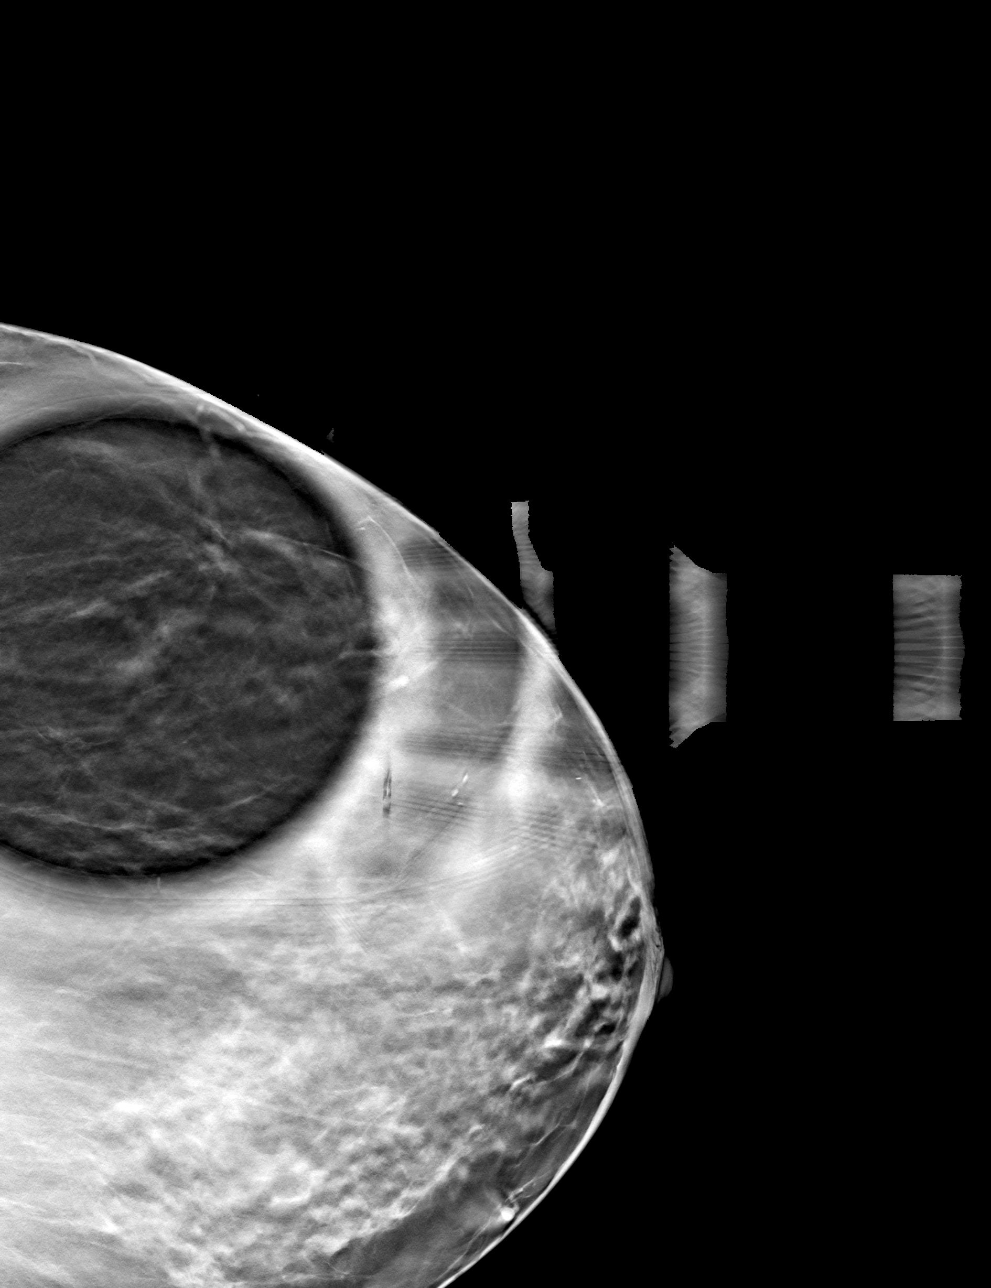

[4 of 12 positions shown; findings below may reference images not displayed]

ACR Breast Density Category b: There are scattered areas of
fibroglandular density.
FINDINGS: 2D/3D spot compression views of the LEFT breast demonstrate
persistent distortion within the OUTER LEFT breast.

Targeted ultrasound is performed, showing no sonographic correlate
to the mammographic LEFT breast distortion.

No abnormal LEFT axillary lymph nodes are noted.
IMPRESSION: 1. Persistent OUTER LEFT breast distortion without sonographic
correlate. Tissue sampling is recommended.
2. No abnormal appearing LEFT axillary lymph nodes.

RECOMMENDATION:
3D/stereotactic guided LEFT breast biopsy, which will be arranged.

I have discussed the findings and recommendations with the patient.
If applicable, a reminder letter will be sent to the patient
regarding the next appointment.

BI-RADS CATEGORY  4: Suspicious.

## 2021-01-08 ENCOUNTER — Inpatient Hospital Stay: Admission: RE | Admit: 2021-01-08 | Payer: Medicare Other | Source: Ambulatory Visit

## 2021-01-15 ENCOUNTER — Ambulatory Visit
Admission: RE | Admit: 2021-01-15 | Discharge: 2021-01-15 | Disposition: A | Discharge status: Home / Self Care | Payer: Medicare Other | Source: Ambulatory Visit | Attending: Family Medicine | Admitting: Family Medicine

## 2021-01-15 ENCOUNTER — Other Ambulatory Visit: Payer: Self-pay

## 2021-01-15 DIAGNOSIS — R928 Other abnormal and inconclusive findings on diagnostic imaging of breast: Secondary | ICD-10-CM

## 2021-01-15 DIAGNOSIS — C50512 Malignant neoplasm of lower-outer quadrant of left female breast: Secondary | ICD-10-CM | POA: Diagnosis not present

## 2021-01-15 DIAGNOSIS — Z170 Estrogen receptor positive status [ER+]: Secondary | ICD-10-CM | POA: Diagnosis not present

## 2021-01-15 IMAGING — MG MM BREAST BX W LOC DEV 1ST LESION IMAGE BX SPEC STEREO GUIDE*L*
8 of 12 series · 8 of 32 positions shown · non-contrast
Comparison: Previous exams.
COMPARISON: Previous exams.

Addendum:
CLINICAL DATA: Left breast distortion.

EXAM:
RIGHT BREAST STEREOTACTIC CORE NEEDLE BIOPSY

[L (1 of 6)]
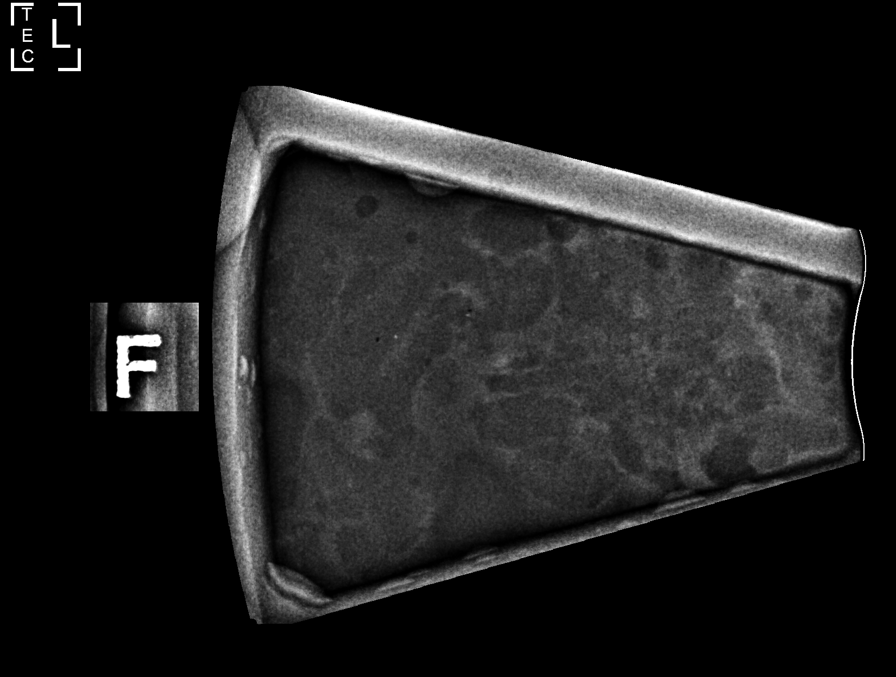

[L (2 of 6)]
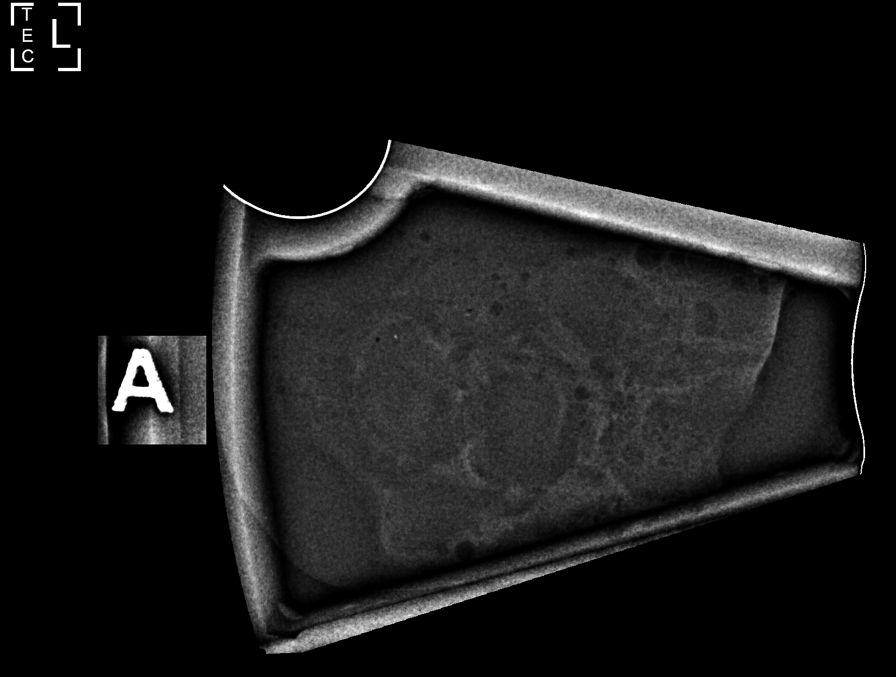

[L (3 of 6)]
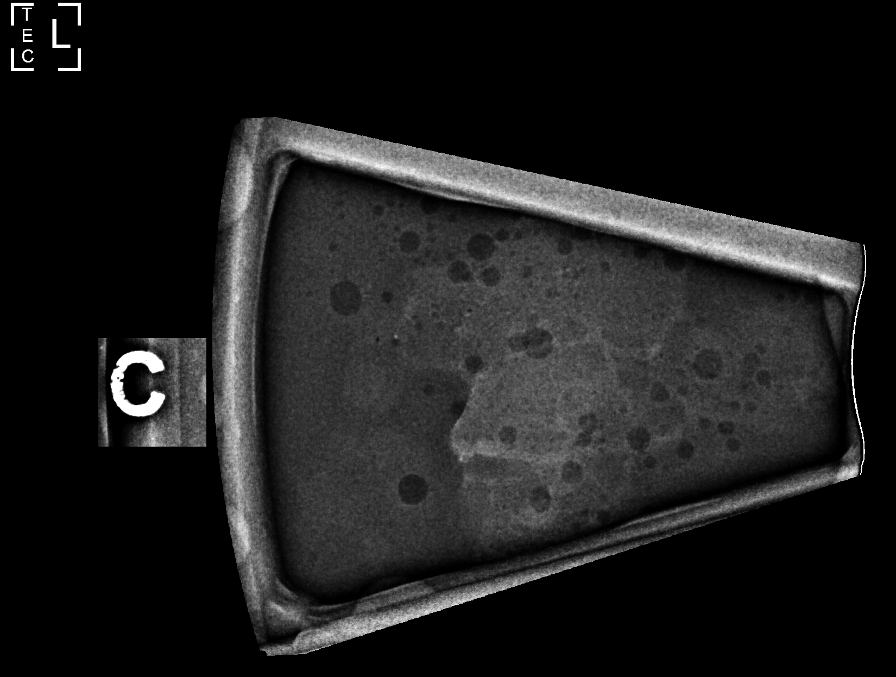

[L (4 of 6)]
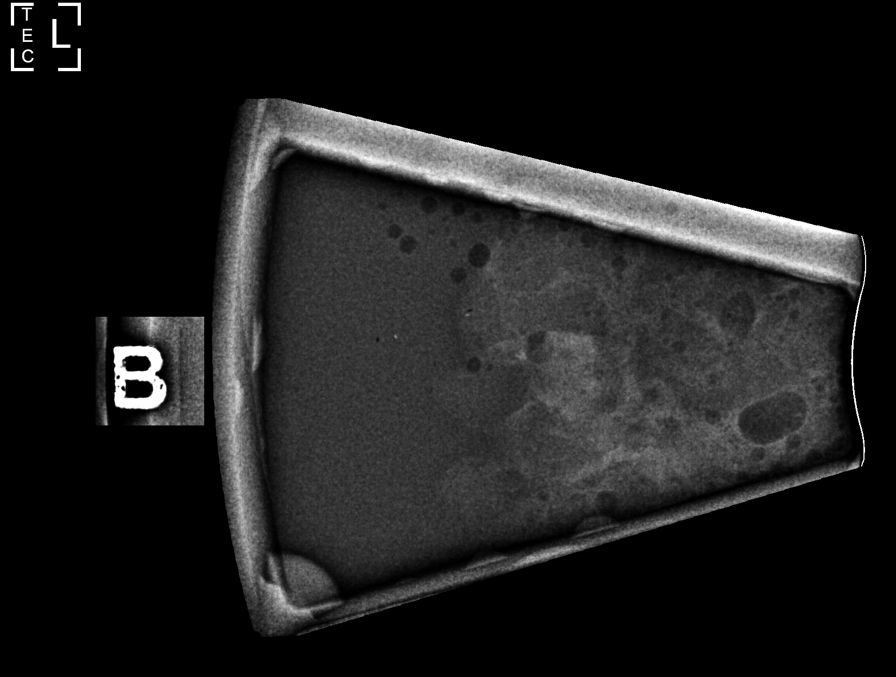

[L (5 of 6)]
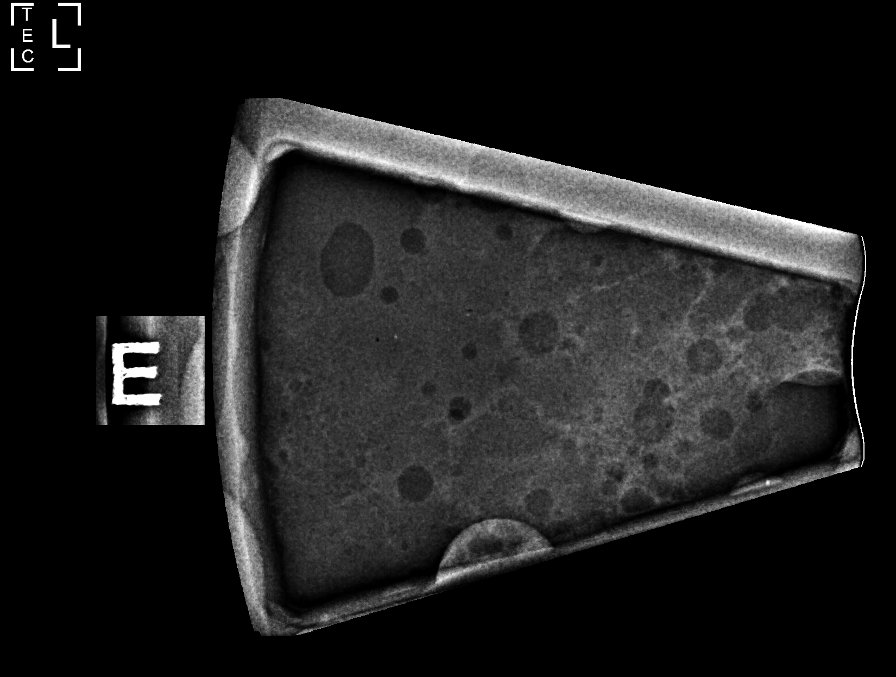

[L (6 of 6)]
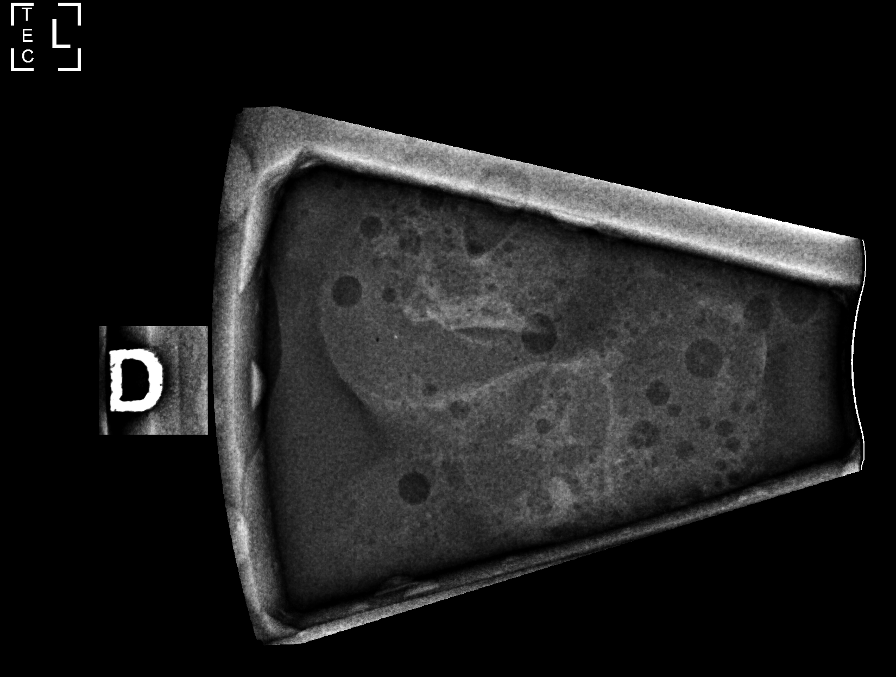

[L LM]
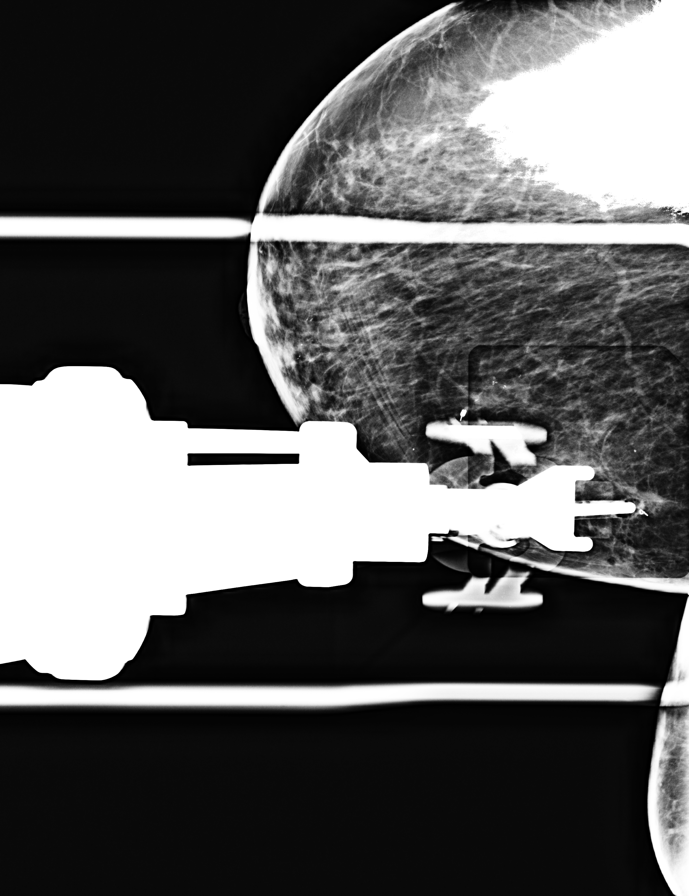

[L LM tomo · tomo slice 25/49.0]
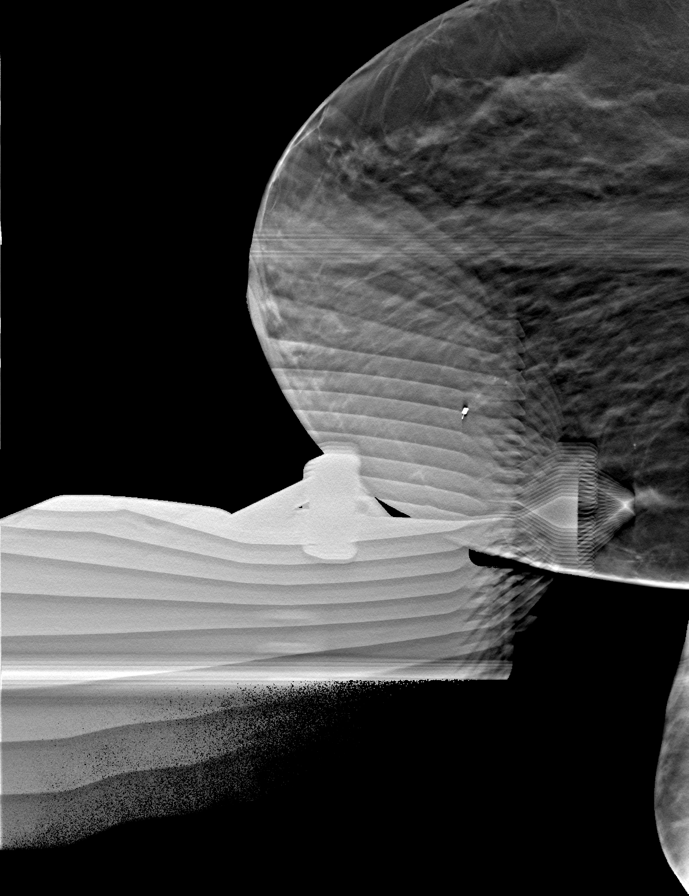

[8 of 32 positions shown; findings below may reference images not displayed]



Using sterile technique and 1% lidocaine and 1% lidocaine with
epinephrine as local anesthetic, under stereotactic guidance, a 9
gauge vacuum assisted device was used to perform core needle biopsy
of distortion in the lower outer quadrant of the left breast using a
lateral to medial approach.

Lesion quadrant: Lower outer quadrant

At the conclusion of the procedure, ribbon shaped tissue marker clip
was deployed into the biopsy cavity. Follow-up 2-view mammogram was
performed and dictated separately.
IMPRESSION: Stereotactic-guided biopsy of the left breast. No apparent
complications.

ADDENDUM:
Pathology revealed GRADE II INVASIVE MAMMARY CARCINOMA WITH LOBULAR
FEATURES of the Left breast, lower outer quadrant, (ribbon clip).
E-cadherin is WEAKLY POSITIVE. Based on the morphology, this
carcinoma is favored to represent invasive lobular carcinoma. This
was found to be concordant by Dr. NYA.

Pathology results were discussed with the patient by telephone. The
patient reported doing well after the biopsy with tenderness and
bruising at the site. Post biopsy instructions and care were
reviewed and questions were answered. The patient was encouraged to
call The [REDACTED] for any additional
concerns. My direct phone number was provided.

Pathology results were called to NYA, CMA with Dr. NYA
NYA at [REDACTED] [REDACTED] in [HOSPITAL][HOSPITAL] on [DATE]. Dr. NYA arrange a surgical referral with Dr. NYA
NYA with [HOSPITAL] in [HOSPITAL][HOSPITAL], per
patient request.

Imaging and pathology results were faxed to Dr. NYA on [DATE].

Pathology results reported by NYA, RN on [DATE].



Using sterile technique and 1% lidocaine and 1% lidocaine with
epinephrine as local anesthetic, under stereotactic guidance, a 9
gauge vacuum assisted device was used to perform core needle biopsy
of distortion in the lower outer quadrant of the left breast using a
lateral to medial approach.

Lesion quadrant: Lower outer quadrant

At the conclusion of the procedure, ribbon shaped tissue marker clip
was deployed into the biopsy cavity. Follow-up 2-view mammogram was
performed and dictated separately.
IMPRESSION: Stereotactic-guided biopsy of the left breast. No apparent
complications.

## 2021-01-15 IMAGING — MG MM BREAST LOCALIZATION CLIP
4 series · 4 of 12 positions shown · non-contrast
Comparison: Previous exam(s).

CLINICAL DATA: Status post stereotactic biopsy left breast
distortion.

EXAM:
3D DIAGNOSTIC LEFT MAMMOGRAM POST STEREOTACTIC BIOPSY

[L CC synth-2D]
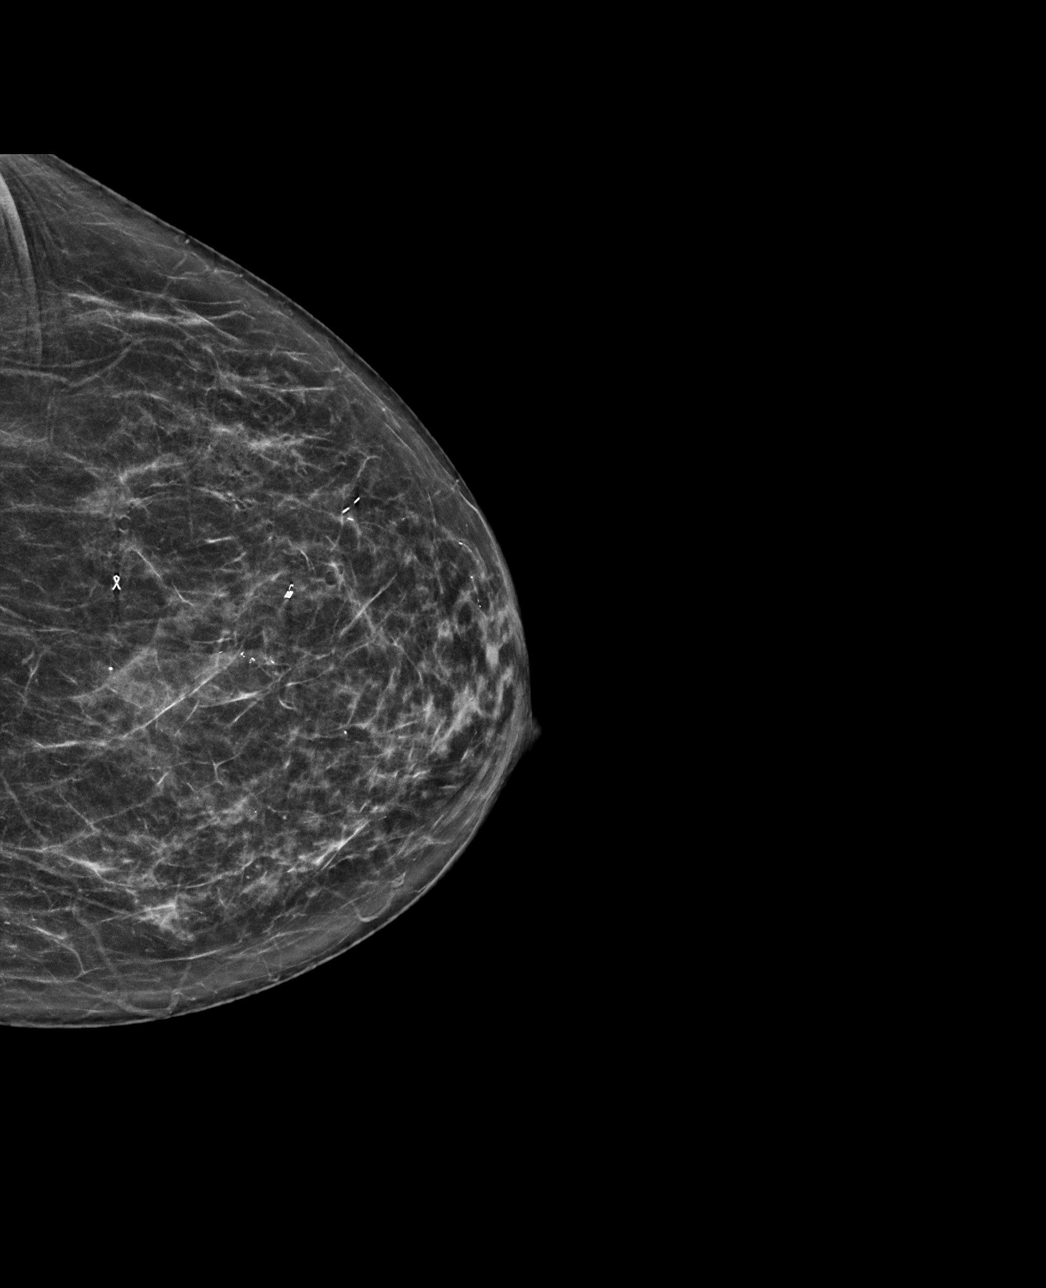

[L LM synth-2D]
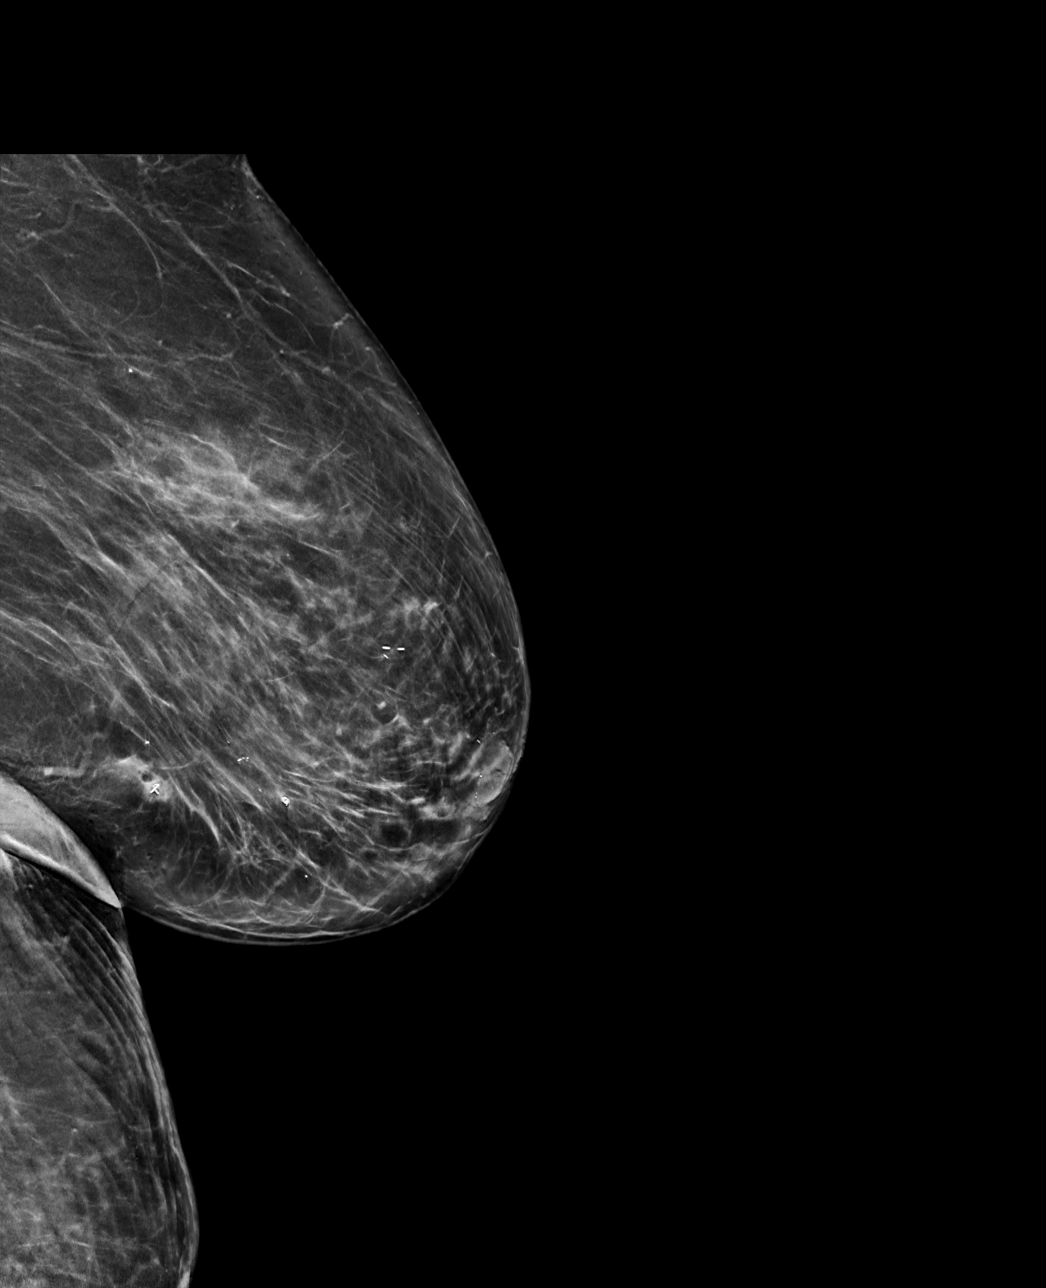

[L CC tomo · tomo slice 31/62.0]
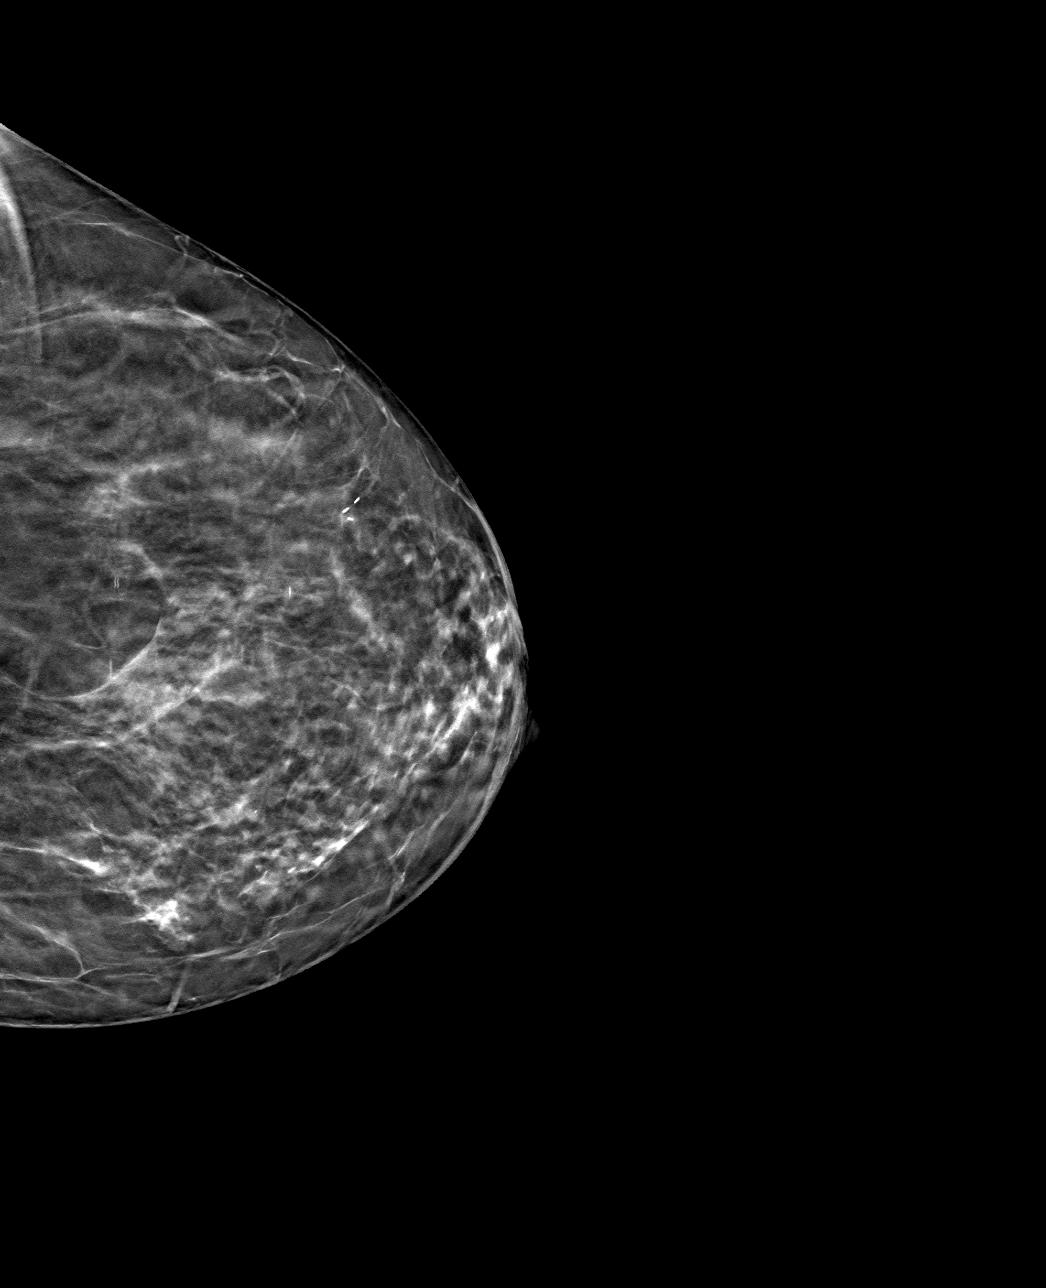

[L LM tomo · tomo slice 41/81.0]
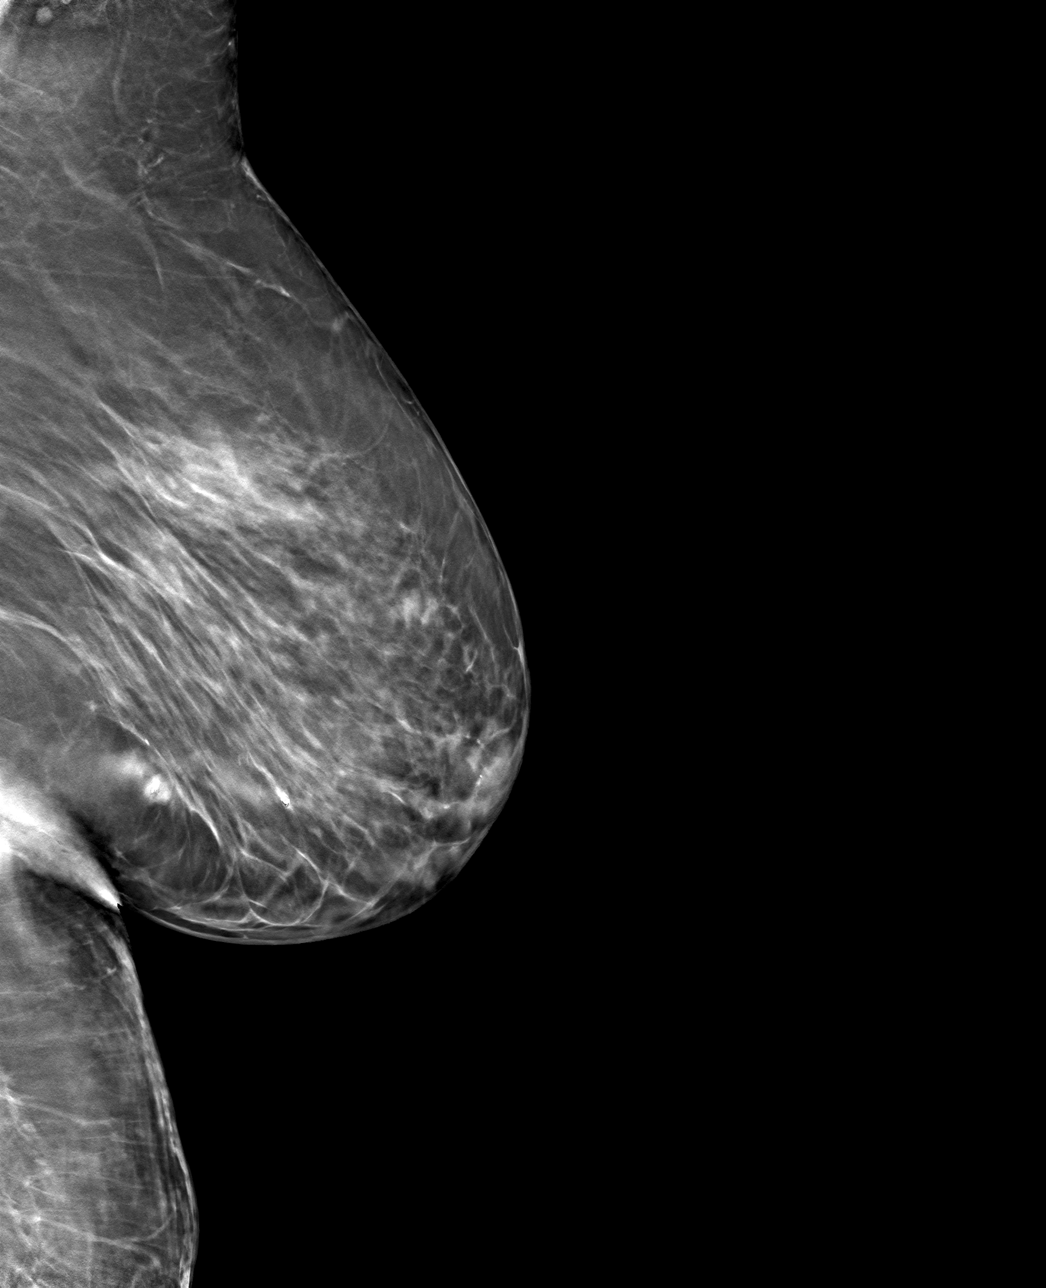

[4 of 12 positions shown; findings below may reference images not displayed]

FINDINGS: 3D Mammographic images were obtained following stereotactic guided
biopsy of the left breast. The biopsy marking clip is is in the
lower outer quadrant of the left breast. It is located approximately
1.2 cm medial to the biopsied distortion.
IMPRESSION: Status post stereotactic biopsy of left breast distortion with the
ribbon shaped clip located approximately 1.2 cm medial to the
biopsied distortion.

Final Assessment: Post Procedure Mammograms for Marker Placement

## 2021-01-28 ENCOUNTER — Ambulatory Visit: Payer: Medicare Other | Admitting: General Surgery

## 2021-01-28 ENCOUNTER — Other Ambulatory Visit: Payer: Self-pay

## 2021-01-28 ENCOUNTER — Encounter: Payer: Self-pay | Admitting: General Surgery

## 2021-01-28 VITALS — BP 145/74 | HR 48 | Temp 97.2°F | Resp 16 | Ht 66.0 in | Wt 186.0 lb

## 2021-01-28 DIAGNOSIS — C50912 Malignant neoplasm of unspecified site of left female breast: Secondary | ICD-10-CM | POA: Diagnosis not present

## 2021-01-29 NOTE — Progress Notes (Signed)
Leslie Barrera; [id number]; Mar 18, 1941   HPI Patient is an 80 year old black female who was referred to my care by Dr. Hilma Favors for evaluation and treatment of a left breast carcinoma.  This was found on routine mammography.  A biopsy of an indistinct density in the lateral aspect of left breast reveals invasive lobular carcinoma.  It is ER/PR positive, HER2 negative.  Patient denies feeling a lump.  There has been no nipple discharge. Past Medical History:  Diagnosis Date   High cholesterol    HTN (hypertension)    Vitamin D deficiency     Past Surgical History:  Procedure Laterality Date   CATARACT EXTRACTION W/PHACO Left 05/04/2020   Procedure: CATARACT EXTRACTION PHACO AND INTRAOCULAR LENS PLACEMENT ([place]);  Surgeon: Baruch Goldmann, MD;  Location: [facility];  Service: Ophthalmology;  Laterality: Left;  CDE: 12.29   CATARACT EXTRACTION W/PHACO Right 05/18/2020   Procedure: CATARACT EXTRACTION PHACO AND INTRAOCULAR LENS PLACEMENT RIGHT EYE;  Surgeon: Baruch Goldmann, MD;  Location: [facility];  Service: Ophthalmology;  Laterality: Right;  CDE 17.41   COLONOSCOPY N/A 04/07/2015   Procedure: COLONOSCOPY;  Surgeon: Aviva Signs, MD;  Location: [facility];  Service: Gastroenterology;  Laterality: N/A;   left knee arthroscopy  2006   [facility]   left-great toe bunion     Small toe surgery     TOTAL KNEE ARTHROPLASTY Right 2009   VESICOVAGINAL FISTULA CLOSURE W/ TAH      Family History  Problem Relation Age of Onset   Alzheimer's disease Father    Other Father        hardening of the arteries   Heart disease Mother    Heart attack Brother    Alzheimer's disease Sister     Current Outpatient Medications on File Prior to Visit  Medication Sig Dispense Refill   bisoprolol-hydrochlorothiazide (ZIAC) 10-6.25 MG per tablet Take 1 tablet by mouth daily.      Calcium Carbonate (CALCIUM 600 PO) Take by mouth.     cholecalciferol (VITAMIN D) 25 MCG (1000 UNIT) tablet Take 1,000 Units by mouth  daily.      furosemide (LASIX) 40 MG tablet Take 40 mg by mouth daily.      polyethylene glycol powder (GLYCOLAX/MIRALAX) powder 1 scoop daily or as needed (Patient taking differently: Take 0.5 Containers by mouth daily as needed for mild constipation or moderate constipation.) 255 g 11   simvastatin (ZOCOR) 40 MG tablet Take 40 mg by mouth at bedtime.     aspirin EC 81 MG tablet Take 81 mg by mouth daily. (Patient not taking: Reported on 01/28/2021)     No current facility-administered medications on file prior to visit.    Allergies  Allergen Reactions   Tetracycline Rash    Social History   Substance and Sexual Activity  Alcohol Use No    Social History   Tobacco Use  Smoking Status Never  Smokeless Tobacco Never    Review of Systems  Constitutional: Negative.   HENT: Negative.    Eyes: Negative.   Respiratory: Negative.    Cardiovascular: Negative.   Gastrointestinal: Negative.   Genitourinary: Negative.   Musculoskeletal: Negative.   Skin: Negative.   Neurological: Negative.   Endo/Heme/Allergies: Negative.   Psychiatric/Behavioral: Negative.     Objective   Vitals:   01/28/21 1204  BP: (!) 145/74  Pulse: (!) 48  Resp: 16  Temp: (!) 97.2 F (36.2 C)  SpO2: 96%    Physical Exam Vitals reviewed. Exam conducted with  a chaperone present.  Constitutional:      Appearance: Normal appearance. She is not ill-appearing.  HENT:     Head: Normocephalic and atraumatic.  Cardiovascular:     Rate and Rhythm: Normal rate and regular rhythm.     Heart sounds: Normal heart sounds. No murmur heard.   No friction rub. No gallop.  Pulmonary:     Effort: Pulmonary effort is normal. No respiratory distress.     Breath sounds: Normal breath sounds. No stridor. No wheezing, rhonchi or rales.  Musculoskeletal:     Cervical back: Normal range of motion.  Lymphadenopathy:     Cervical: No cervical adenopathy.  Skin:    General: Skin is warm and dry.  Neurological:      Mental Status: She is alert and oriented to person, place, and time.  Breast: Patient does seem to have an indistinct area of increased density in the breast on the left side.  She does have pendulous breasts.  No dominant mass, nipple discharge, or dimpling are noted in the right breast.  Axillas are negative for palpable nodes.  Mammography, ultrasound, and pathology reports reviewed  Assessment  Infiltrating lobular carcinoma, ER/PR positive, HER2 negative Plan  From a surgical perspective, patient may benefit from a left simple mastectomy.  She is at slightly increased risk for surgical intervention, but I feel she can tolerate the surgery.  We will get oncology input as to the treatment plan.  Further management is pending those results.  Patient and son are fully aware of the diagnosis.

## 2021-02-10 NOTE — Progress Notes (Signed)
[facility] [address] [place] [address]   Patient Care Team: Sharilyn Sites, MD as PCP - General (Family Medicine) Derek Jack, MD as Medical Oncologist (Oncology) Brien Mates, RN as Oncology Nurse Navigator (Oncology)  CHIEF COMPLAINTS/PURPOSE OF CONSULTATION:  Newly diagnosed invasive lobular carcinoma of the outer quadrant of the left breast  HISTORY OF PRESENTING ILLNESS:  Leslie Barrera 80 y.o. female is here because of recent diagnosis of invasive lobular carcinoma of the outer quadrant of the left breast.  Today she reports feeling good and she is accompanied by her son. She has a previous biopsy on 12/13/16 of the left inner quadrant of her left breast that was benign. She has a right knee replacement.  She lives at home with her husband, and she denies a history of smoking. 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 Her brother had prostate cancer, and a maternal aunt that had breast cancer.   In terms of breast cancer risk profile:  She menarched at early age of 63 and went to menopause at age 65s due to a hysterectomy.  She had 1 pregnancy, her first child was born at age 59  She was exposed to fertility medications or hormone replacement therapy; she was on estrogen for about 10 years.  She has family history of Breast/GYN/GI cancer  I reviewed her records extensively and collaborated the history with the patient.  SUMMARY OF ONCOLOGIC HISTORY: Oncology History   No history exists.    MEDICAL HISTORY:  Past Medical History:  Diagnosis Date   High cholesterol    HTN (hypertension)    Vitamin D deficiency     SURGICAL HISTORY: Past Surgical History:  Procedure Laterality Date   CATARACT EXTRACTION W/PHACO Left 05/04/2020   Procedure: CATARACT EXTRACTION PHACO AND INTRAOCULAR LENS PLACEMENT ([place]);  Surgeon: Baruch Goldmann, MD;  Location: [facility];  Service: Ophthalmology;  Laterality: Left;  CDE: 12.29    CATARACT EXTRACTION W/PHACO Right 05/18/2020   Procedure: CATARACT EXTRACTION PHACO AND INTRAOCULAR LENS PLACEMENT RIGHT EYE;  Surgeon: Baruch Goldmann, MD;  Location: [facility];  Service: Ophthalmology;  Laterality: Right;  CDE 17.41   COLONOSCOPY N/A 04/07/2015   Procedure: COLONOSCOPY;  Surgeon: Aviva Signs, MD;  Location: [facility];  Service: Gastroenterology;  Laterality: N/A;   left knee arthroscopy  2006   [place]   left-great toe bunion     Small toe surgery     TOTAL KNEE ARTHROPLASTY Right 2009   VESICOVAGINAL FISTULA CLOSURE W/ TAH      SOCIAL HISTORY: Social History   Socioeconomic History   Marital status: Married    Spouse name: Not on file   Number of children: 1   Years of education: Not on file   Highest education level: Not on file  Occupational History   Not on file  Tobacco Use   Smoking status: Never   Smokeless tobacco: Never  Vaping Use   Vaping Use: Never used  Substance and Sexual Activity   Alcohol use: No   Drug use: No   Sexual activity: Not Currently    Birth control/protection: Surgical    Comment: hyst  Other Topics Concern   Not on file  Social History Narrative   Not on file   Social Determinants of Health   Financial Resource Strain: Low Risk    Difficulty of Paying Living Expenses: Not hard at all  Food Insecurity: No Food Insecurity   Worried About  Running Out of Food in the Last Year: Never true   Ran Out of Food in the Last Year: Never true  Transportation Needs: No Transportation Needs   Lack of Transportation (Medical): No   Lack of Transportation (Non-Medical): No  Physical Activity: Sufficiently Active   Days of Exercise per Week: 7 days   Minutes of Exercise per Session: 30 min  Stress: No Stress Concern Present   Feeling of Stress : Not at all  Social Connections: Moderately Integrated   Frequency of Communication with Friends and Family: More than three times a week   Frequency of Social Gatherings with Friends and  Family: More than three times a week   Attends Religious Services: More than 4 times per year   Active Member of [employer] or Organizations: No   Attends Music therapist: Never   Marital Status: Married  Human resources officer Violence: Not At Risk   Fear of Current or Ex-Partner: No   Emotionally Abused: No   Physically Abused: No   Sexually Abused: No    FAMILY HISTORY: Family History  Problem Relation Age of Onset   Heart disease Mother    Alzheimer's disease Father    Other Father        hardening of the arteries   Alzheimer's disease Sister    Heart attack Brother    Prostate cancer Brother     ALLERGIES:  is allergic to tetracycline.  MEDICATIONS:  Current Outpatient Medications  Medication Sig Dispense Refill   aspirin EC 81 MG tablet Take 81 mg by mouth daily.     bisoprolol-hydrochlorothiazide (ZIAC) 10-6.25 MG per tablet Take 1 tablet by mouth daily.      Calcium Carbonate (CALCIUM 600 PO) Take by mouth.     cholecalciferol (VITAMIN D) 25 MCG (1000 UNIT) tablet Take 1,000 Units by mouth daily.      furosemide (LASIX) 40 MG tablet Take 40 mg by mouth daily.      simvastatin (ZOCOR) 40 MG tablet Take 40 mg by mouth at bedtime.     No current facility-administered medications for this visit.    REVIEW OF SYSTEMS:   Review of Systems  Constitutional:  Negative for appetite change and fatigue (75%).  All other systems reviewed and are negative.  PHYSICAL EXAMINATION: ECOG PERFORMANCE STATUS: 1 - Symptomatic but completely ambulatory  Vitals:   02/11/21 1351  BP: (!) 155/76  Pulse: (!) 56  Resp: 16  Temp: 97.8 F (36.6 C)  SpO2: 100%   Filed Weights   02/11/21 1351  Weight: 188 lb 9.6 oz (85.5 kg)   Physical Exam Vitals reviewed.  Constitutional:      Appearance: Normal appearance.  Cardiovascular:     Rate and Rhythm: Normal rate and regular rhythm.     Pulses: Normal pulses.     Heart sounds: Normal heart sounds.  Pulmonary:     Effort:  Pulmonary effort is normal.     Breath sounds: Normal breath sounds.  Chest:  Breasts:    Right: No inverted nipple, mass, nipple discharge, skin change or tenderness.     Left: No inverted nipple, mass, nipple discharge, skin change (thickness) or tenderness.  Lymphadenopathy:     Upper Body:     Right upper body: No supraclavicular, axillary or pectoral adenopathy.     Left upper body: No supraclavicular, axillary or pectoral adenopathy.  Neurological:     General: No focal deficit present.     Mental Status: She is alert  and oriented to person, place, and time.  Psychiatric:        Mood and Affect: Mood normal.        Behavior: Behavior normal.    Breast Exam Chaperone: Thana Ates    LABORATORY DATA:  I have reviewed the data as listed No results found for this or any previous visit (from the past 2160 hour(s)).  RADIOGRAPHIC STUDIES: I have personally reviewed the radiological reports and agreed with the findings in the report. MM CLIP PLACEMENT LEFT  Result Date: 01/15/2021 CLINICAL DATA:  Status post stereotactic biopsy left breast distortion. EXAM: 3D DIAGNOSTIC LEFT MAMMOGRAM POST STEREOTACTIC BIOPSY COMPARISON:  Previous exam(s). FINDINGS: 3D Mammographic images were obtained following stereotactic guided biopsy of the left breast. The biopsy marking clip is is in the lower outer quadrant of the left breast. It is located approximately 1.2 cm medial to the biopsied distortion. IMPRESSION: Status post stereotactic biopsy of left breast distortion with the ribbon shaped clip located approximately 1.2 cm medial to the biopsied distortion. Final Assessment: Post Procedure Mammograms for Marker Placement Electronically Signed   By: Lillia Mountain M.D.   On: 01/15/2021 13:30  MM LT BREAST BX W LOC DEV 1ST LESION IMAGE BX SPEC STEREO GUIDE  Addendum Date: 01/20/2021   ADDENDUM REPORT: 01/20/2021 09:17 ADDENDUM: Pathology revealed GRADE II INVASIVE MAMMARY CARCINOMA WITH LOBULAR  FEATURES of the Left breast, lower outer quadrant, (ribbon clip). E-cadherin is WEAKLY POSITIVE. Based on the morphology, this carcinoma is favored to represent invasive lobular carcinoma. This was found to be concordant by Dr. Lillia Mountain. Pathology results were discussed with the patient by telephone. The patient reported doing well after the biopsy with tenderness and bruising at the site. Post biopsy instructions and care were reviewed and questions were answered. The patient was encouraged to call [facility] for any additional concerns. My direct phone number was provided. Pathology results were called to Bonnita Levan, [place] with Dr. Sharilyn Sites at [facility] in [place], [place] on January 20, 2021. Dr. Hilma Favors will arrange a surgical referral with Dr. Aviva Signs with [facility] in [place], [place], per patient request. Imaging and pathology results were faxed to Dr. Hilma Favors on January 20, 2021. Pathology results reported by Terie Purser, RN on 01/20/2021. Electronically Signed   By: Lillia Mountain M.D.   On: 01/20/2021 09:17   Result Date: 01/20/2021 CLINICAL DATA:  Left breast distortion. EXAM: RIGHT BREAST STEREOTACTIC CORE NEEDLE BIOPSY COMPARISON:  Previous exams. FINDINGS: The patient and I discussed the procedure of stereotactic-guided biopsy including benefits and alternatives. We discussed the high likelihood of a successful procedure. We discussed the risks of the procedure including infection, bleeding, tissue injury, clip migration, and inadequate sampling. Informed written consent was given. The usual time out protocol was performed immediately prior to the procedure. Using sterile technique and 1% lidocaine and 1% lidocaine with epinephrine as local anesthetic, under stereotactic guidance, a 9 gauge vacuum assisted device was used to perform core needle biopsy of distortion in the lower outer quadrant of the left breast using a lateral to  medial approach. Lesion quadrant: Lower outer quadrant At the conclusion of the procedure, ribbon shaped tissue marker clip was deployed into the biopsy cavity. Follow-up 2-view mammogram was performed and dictated separately. IMPRESSION: Stereotactic-guided biopsy of the left breast. No apparent complications. Electronically Signed: By: Lillia Mountain M.D. On: 01/15/2021 11:02    ASSESSMENT:  1.  Invasive lobular carcinoma of the outer  quadrant of the left breast: - Mammogram on 12/21/2020, BI-RADS 0 with possible distortion in the left breast. - Ultrasound/mammogram on 12/30/2020-persistent outer left breast distortion without sonographic correlate.  No lymphadenopathy in the left axilla. - Biopsy on 01/15/2021 of the left breast outer quadrant-invasive lobular carcinoma, E-cadherin weakly positive.  ER-70%, PR-90%, HER2 1+, Ki-67 20%.  2.  Social/family history: - Lives with husband at home.  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 - Brother had prostate cancer.  Maternal aunt had breast cancer.   PLAN:  1.  Invasive lobular carcinoma of the outer quadrant of the left breast: - We discussed the findings on the imaging as well as biopsy results in detail. - Physical examination did not reveal any clearly palpable mass.  Given the histology and high likelihood of multifocal and multicentric disease, recommend MRI of the breast. - She was already evaluated by Dr. Arnoldo Morale. - We have reviewed surgical options including lumpectomy versus mastectomy.  Patient would like to think about non surgical options also.  We talked about aromatase inhibitor therapy, but not curative. - I will see her back after the MRI and discuss options again. - She will also benefit from germline mutation testing given her family history.     Derek Jack, MD 02/11/21 2:25 PM  [place] [phone]   I, Thana Ates, am acting as a scribe for Dr.  Derek Jack.  I, Derek Jack MD, have reviewed the above documentation for accuracy and completeness, and I agree with the above.

## 2021-02-11 ENCOUNTER — Inpatient Hospital Stay (HOSPITAL_COMMUNITY): Payer: Medicare Other | Attending: Hematology | Admitting: Hematology

## 2021-02-11 ENCOUNTER — Encounter (HOSPITAL_COMMUNITY): Payer: Self-pay | Admitting: Hematology

## 2021-02-11 ENCOUNTER — Other Ambulatory Visit: Payer: Self-pay

## 2021-02-11 DIAGNOSIS — C50912 Malignant neoplasm of unspecified site of left female breast: Secondary | ICD-10-CM | POA: Diagnosis not present

## 2021-02-11 DIAGNOSIS — Z170 Estrogen receptor positive status [ER+]: Secondary | ICD-10-CM | POA: Diagnosis not present

## 2021-02-11 DIAGNOSIS — C50919 Malignant neoplasm of unspecified site of unspecified female breast: Secondary | ICD-10-CM | POA: Insufficient documentation

## 2021-02-11 NOTE — Patient Instructions (Addendum)
[place] at [facility] Discharge Instructions  You were seen and examined today by Dr. Delton Coombes. Dr. Delton Coombes is a medical oncologist, meaning he specializes in the management of cancer diagnoses with medications. Dr. Delton Coombes discussed your past medical history, family history of caner and the events that led to you being here today.  Dr. Delton Coombes reviewed the results of your recent biopsy results, which showed a type of breast cancer known as invasive lobular carcinoma. It is estrogen and progesterone receptor positive, meaning it is fed by hormones. Dr. Delton Coombes discussed treatment options. The only cure for your cancer is surgery. Dr. Delton Coombes has recommended a breast MRI to see if there is an identifiable mass, to see if there is an option for lumpectomy versus mastectomy. There is an antiestrogen pill option, but it would not cure your cancer.  Follow-up after MRI.   Thank you for choosing [place] at [facility] to provide your oncology and hematology care.  To afford each patient quality time with our provider, please arrive at least 15 minutes before your scheduled appointment time.   If you have a lab appointment with the [facility] please come in thru the Main Entrance and check in at the main information desk.  You need to re-schedule your appointment should you arrive 10 or more minutes late.  We strive to give you quality time with our providers, and arriving late affects you and other patients whose appointments are after yours.  Also, if you no show three or more times for appointments you may be dismissed from the clinic at the providers discretion.     Again, thank you for choosing [facility].  Our hope is that these requests will decrease the amount of time that you wait before being seen by our physicians.       _____________________________________________________________  Should you have questions  after your visit to [facility], please contact our office at [phone] and follow the prompts.  Our office hours are 8:00 a.m. and 4:30 p.m. Monday - Friday.  Please note that voicemails left after 4:00 p.m. may not be returned until the following business day.  We are closed weekends and major holidays.  You do have access to a nurse 24-7, just call the main number to the clinic [phone] and do not press any options, hold on the line and a nurse will answer the phone.    For prescription refill requests, have your pharmacy contact our office and allow 72 hours.    Due to Covid, you will need to wear a mask upon entering the hospital. If you do not have a mask, a mask will be given to you at the Main Entrance upon arrival. For doctor visits, patients may have 1 support person age 81 or older with them. For treatment visits, patients can not have anyone with them due to social distancing guidelines and our immunocompromised population.

## 2021-02-18 ENCOUNTER — Ambulatory Visit: Payer: Medicare Other | Admitting: General Surgery

## 2021-02-23 ENCOUNTER — Other Ambulatory Visit: Payer: Self-pay

## 2021-02-23 ENCOUNTER — Ambulatory Visit (HOSPITAL_COMMUNITY)
Admission: RE | Admit: 2021-02-23 | Discharge: 2021-02-23 | Disposition: A | Discharge status: Home / Self Care | Payer: Medicare Other | Source: Ambulatory Visit | Attending: Hematology | Admitting: Hematology

## 2021-02-23 DIAGNOSIS — C50919 Malignant neoplasm of unspecified site of unspecified female breast: Secondary | ICD-10-CM

## 2021-02-23 DIAGNOSIS — C50512 Malignant neoplasm of lower-outer quadrant of left female breast: Secondary | ICD-10-CM | POA: Diagnosis not present

## 2021-02-23 IMAGING — MR MR BREAST BILAT WO/W CM
7 of 11 series · 29 of 48 positions shown · IV contrast (8 ML GADAVIST)
Comparison: Prior mammograms and ultrasounds

CLINICAL DATA: 79-year-old female with newly diagnosed LEFT breast
invasive lobular carcinoma.

LABS:  Not applicable
EXAM:
BILATERAL BREAST MRI WITH AND WITHOUT CONTRAST
TECHNIQUE: Multiplanar, multisequence MR images of both breasts were obtained
prior to and following the intravenous administration of 8 ml of
Gadavist

[Series 2: T2 · axial · 3.0mm · 0.91mm/px · 1 of 54 slices shown]
[im 1/54]
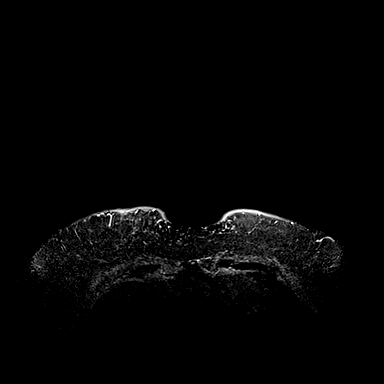

[Series 3: T1 fat-sat · axial · 1.2mm · 0.78mm/px · z∈[-128,+43]mm · 5 of 144 slices shown (1 of 4)]
[im 1/144]
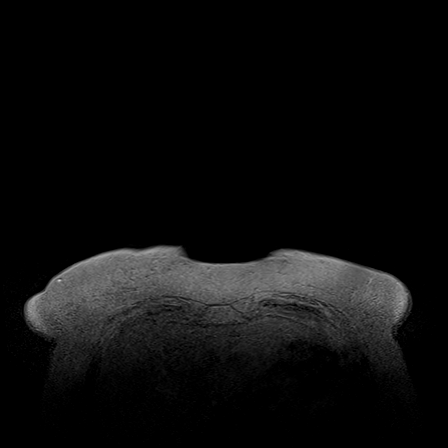
[im 36/144]
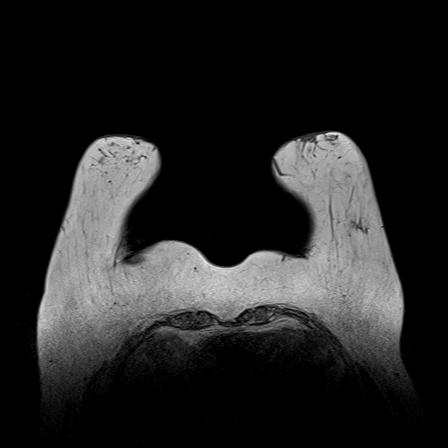
[im 72/144]
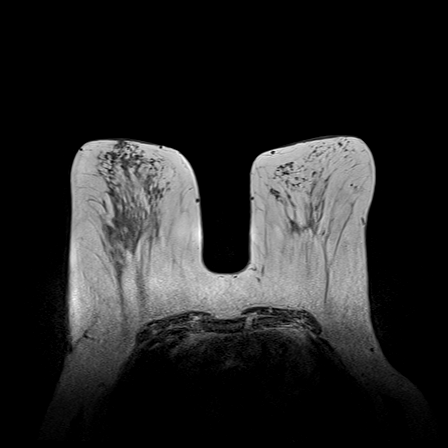
[im 108/144]
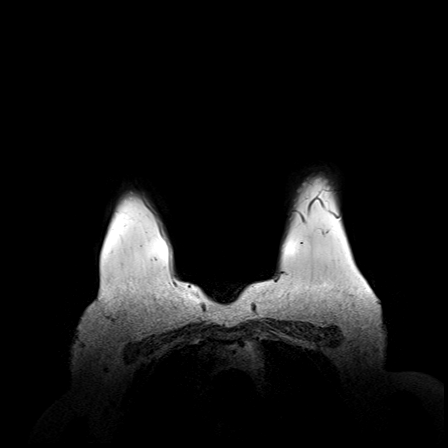
[im 144/144]
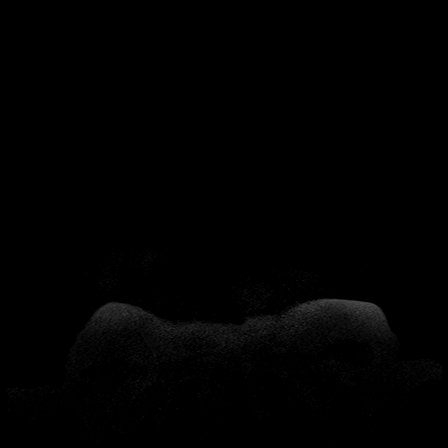

[Series 5: T1 fat-sat · axial · 1.2mm · 0.84mm/px · z∈[-128,+43]mm · 5 of 144 slices shown (2 of 4)]
[im 1/144]
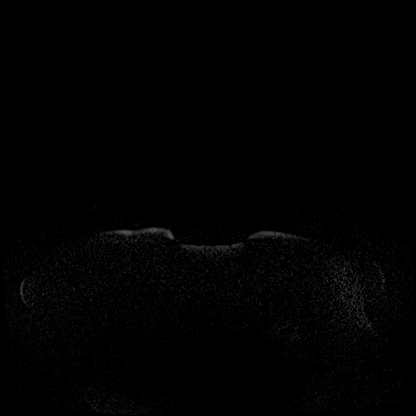
[im 36/144]
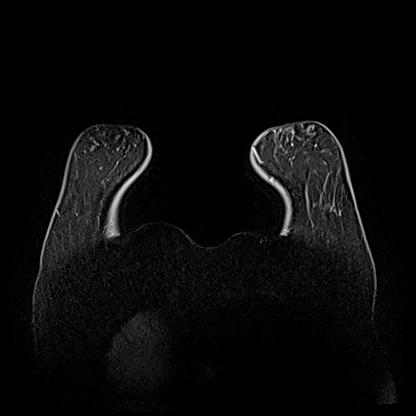
[im 72/144]
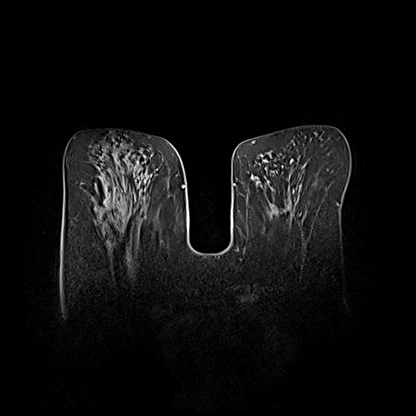
[im 108/144]
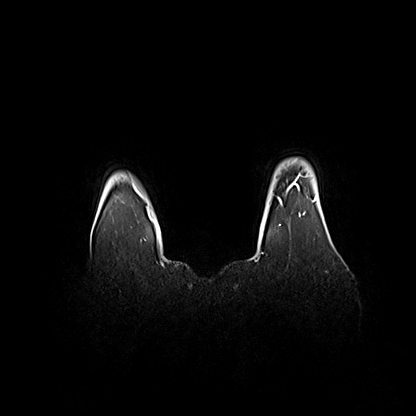
[im 144/144]
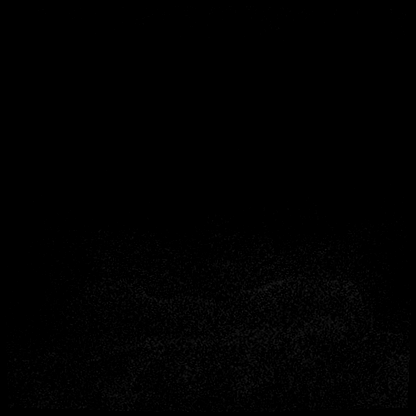

[Series 6: T1 fat-sat · axial · 1.2mm · 0.84mm/px · z∈[-128,+43]mm · 5 of 144 slices shown (3 of 4)]
[im 1/144]
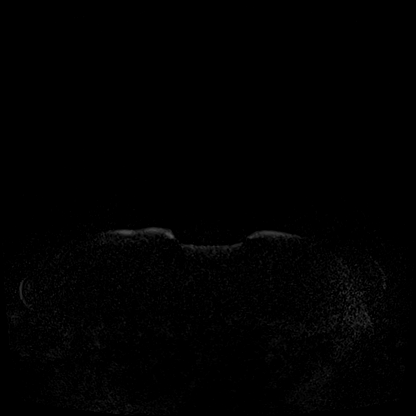
[im 36/144]
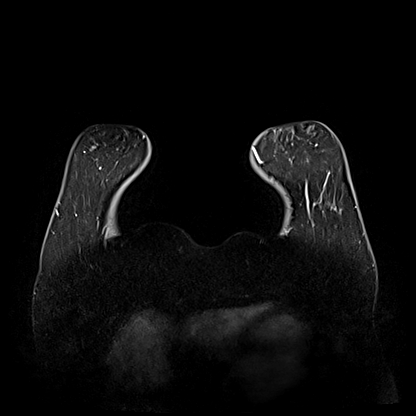
[im 72/144]
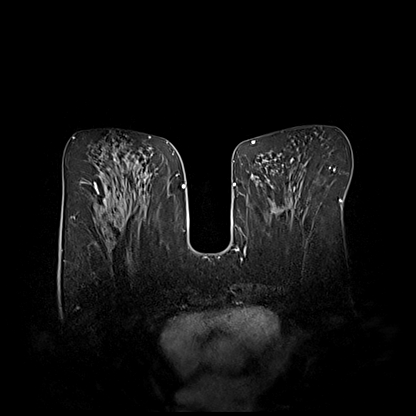
[im 108/144]
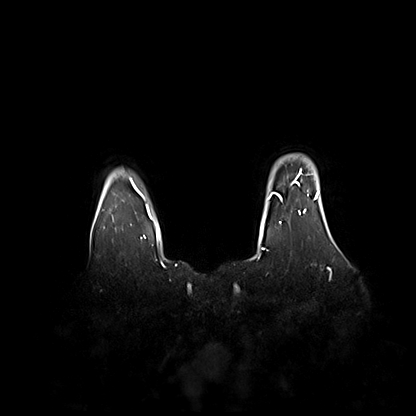
[im 144/144]
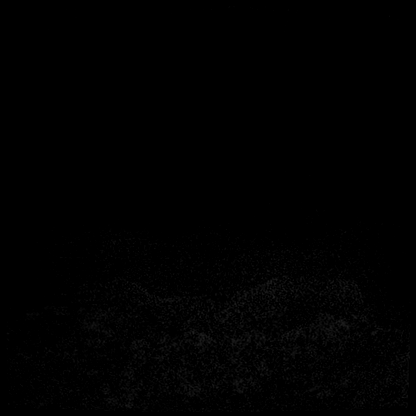

[Series 7: T1 · axial · 1.2mm · 0.84mm/px · z∈[-128,+43]mm · 6 of 144 slices shown (1 of 2)]
[im 1/144]
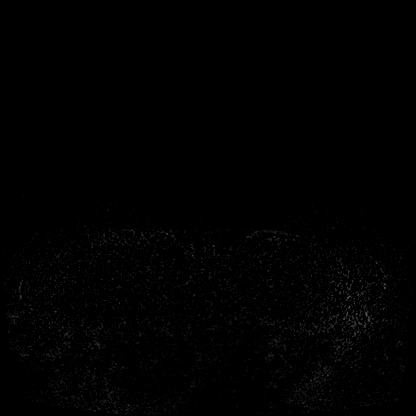
[im 29/144]
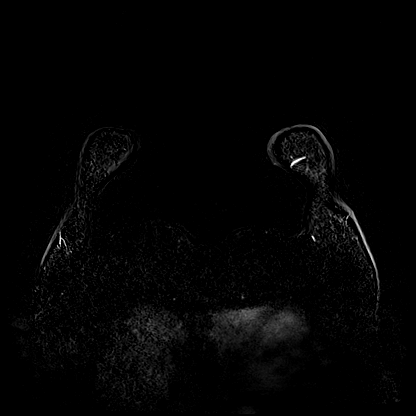
[im 58/144]
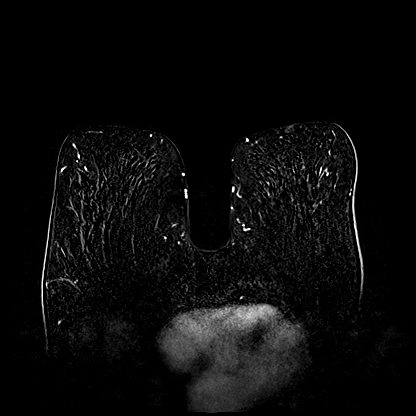
[im 86/144]
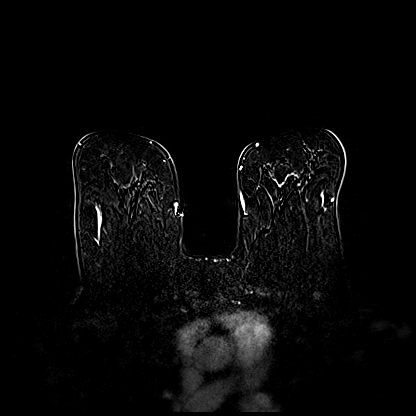
[im 115/144]
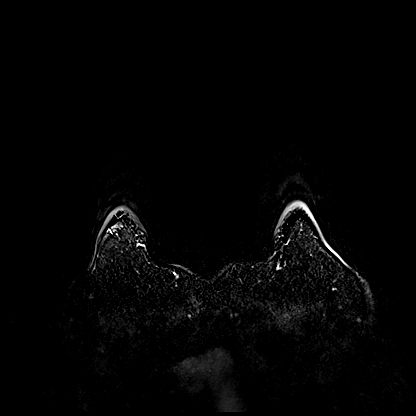
[im 144/144]
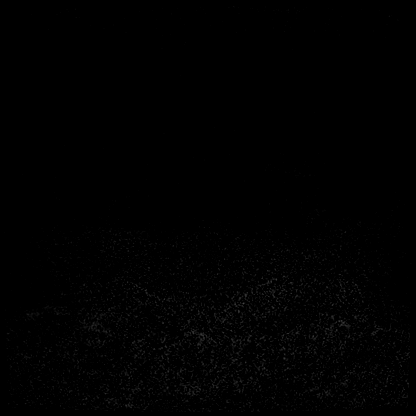

[Series 8: T1 · coronal · 350.0mm · 0.84mm/px · 1 of 3 slices shown (2 of 2)]
[im 1/3]
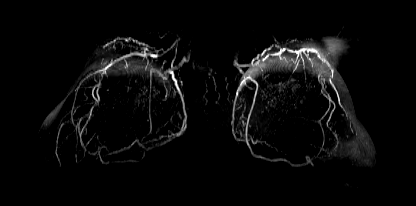

[Series 10: T1 fat-sat · axial · 1.2mm · 0.84mm/px · z∈[-128,+43]mm · 6 of 143 slices shown (4 of 4)]
[im 1/143]
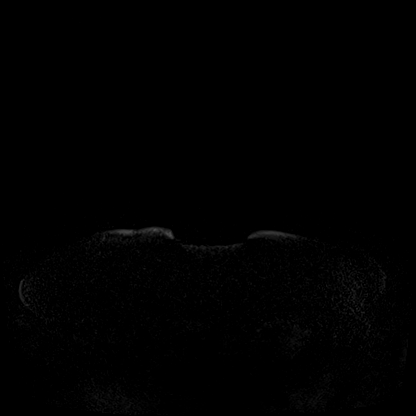
[im 29/143]
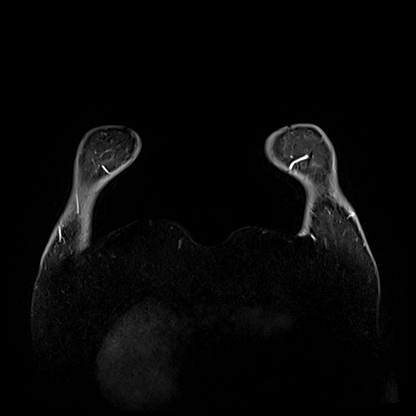
[im 57/143]
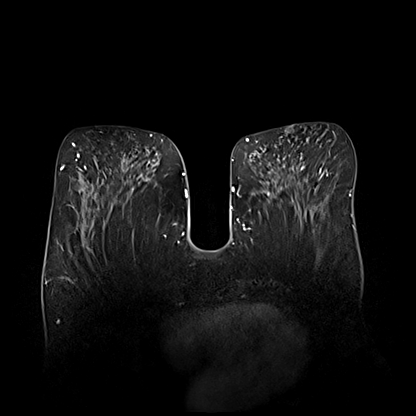
[im 86/143]
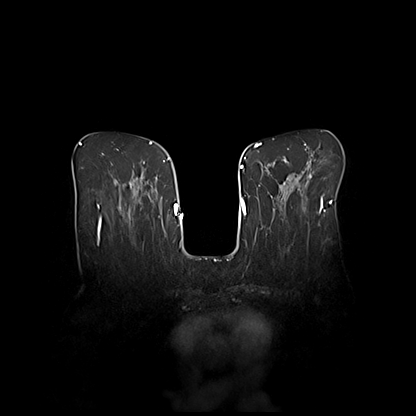
[im 114/143]
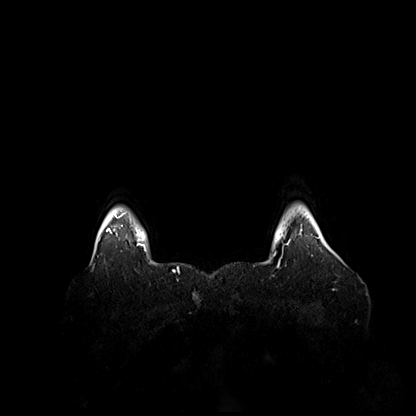
[im 143/143]
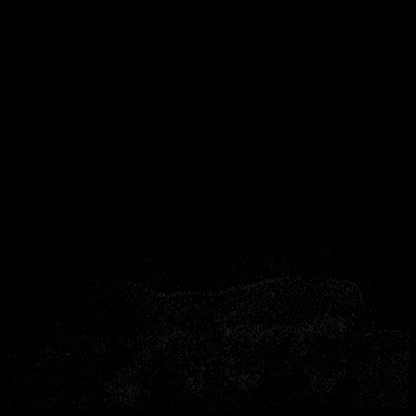

[29 of 48 positions shown; findings below may reference images not displayed]

Three-dimensional MR images were rendered by post-processing of the
original MR data on an independent workstation. The
three-dimensional MR images were interpreted, and findings are
reported in the following complete MRI report for this study. Three
dimensional images were evaluated at the independent interpreting
workstation using the DynaCAD thin client.
FINDINGS: Breast composition: b. Scattered fibroglandular tissue.

Background parenchymal enhancement: Mild

Right breast: No mass or abnormal enhancement.

Left breast: A 2.2 x 2 x 1 cm (AP x transverse x CC) irregular mass
is identified within the posterior LOWER OUTER LEFT breast (series
13: Image 105) compatible with biopsy-proven malignancy. RIBBON
biopsy marker clip artifact lies 1.5 cm anterior/medial/superior to
the center of this mass.

No other suspicious areas of enhancement are identified.

Biopsy marker clip artifact (COIL clip) from [HO] benign biopsy lies
within the LOWER LEFT breast, middle depth.

Lymph nodes: No abnormal appearing lymph nodes.

Ancillary findings:  None.
IMPRESSION: 1. 2.2 cm biopsy-proven malignancy within the LOWER OUTER LEFT
breast. Please note for localization purposes that the RIBBON biopsy
marker clip artifact lies 1.5 cm anterior/medial/superior to the
center of this malignancy.
2. No evidence of multifocal, multicentric or contralateral
malignancy. No abnormal appearing lymph nodes.

RECOMMENDATION:
Treatment plan

BI-RADS CATEGORY  6: Known biopsy-proven malignancy.

## 2021-02-23 MED ORDER — GADOBUTROL 1 MMOL/ML IV SOLN
8.0000 mL | Freq: Once | INTRAVENOUS | Status: AC | PRN
  Administered 2021-02-23: 8 mL via INTRAVENOUS

## 2021-02-24 NOTE — Progress Notes (Signed)
[facility] [address], [place] [address]   Patient Care Team: Sharilyn Sites, MD as PCP - General (Family Medicine) Derek Jack, MD as Medical Oncologist (Oncology) Brien Mates, RN as Oncology Nurse Navigator (Oncology)  SUMMARY OF ONCOLOGIC HISTORY: Oncology History   No history exists.    CHIEF COMPLIANT: Follow-up of invasive lobular carcinoma of the outer quadrant of the left breast   INTERVAL HISTORY: Ms. Leslie Barrera is a 80 y.o. female here today for follow up of her invasive lobular carcinoma of the outer quadrant of the left breast. Her last visit was on 02/11/2021.   Today she reports feeling good. She is still undecided if she would like to move forward with surgery.   REVIEW OF SYSTEMS:   Review of Systems  Constitutional:  Negative for appetite change and fatigue (75%).  All other systems reviewed and are negative.  I have reviewed the past medical history, past surgical history, social history and family history with the patient and they are unchanged from previous note.   ALLERGIES:   is allergic to tetracycline.   MEDICATIONS:  Current Outpatient Medications  Medication Sig Dispense Refill   aspirin EC 81 MG tablet Take 81 mg by mouth daily.     bisoprolol-hydrochlorothiazide (ZIAC) 10-6.25 MG per tablet Take 1 tablet by mouth daily.      Calcium Carbonate (CALCIUM 600 PO) Take by mouth.     cholecalciferol (VITAMIN D) 25 MCG (1000 UNIT) tablet Take 1,000 Units by mouth daily.      furosemide (LASIX) 40 MG tablet Take 40 mg by mouth daily.      simvastatin (ZOCOR) 40 MG tablet Take 40 mg by mouth at bedtime.     No current facility-administered medications for this visit.     PHYSICAL EXAMINATION: Performance status (ECOG): 1 - Symptomatic but completely ambulatory  There were no vitals filed for this visit. Wt Readings from Last 3 Encounters:  02/11/21 188 lb 9.6 oz (85.5 kg)  01/28/21 186 lb (84.4 kg)   11/05/20 188 lb (85.3 kg)   Physical Exam  Breast Exam Chaperone: Thana Ates     LABORATORY DATA:  I have reviewed the data as listed CMP Latest Ref Rng & Units 04/30/2020 05/08/2018 12/11/2009  Glucose 70 - 99 mg/dL 114(H) 88 90  BUN 8 - 23 mg/dL _0 Creatinine 0.44 - 1.00 mg/dL 0.74 0.55 0.57  Sodium 135 - 145 mmol/L 137 138 137  Potassium 3.5 - 5.1 mmol/L 3.4(L) 3.8 3.3(L)  Chloride 98 - 111 mmol/L 101 105 104  CO2 22 - 32 mmol/L _1 Calcium 8.9 - 10.3 mg/dL 9.2 8.8(L) 8.7  Total Protein 6.5 - 8.1 g/dL - 7.6 -  Total Bilirubin 0.3 - 1.2 mg/dL - 0.5 -  Alkaline Phos 38 - 126 U/L - 54 -  AST 15 - 41 U/L - 24 -  ALT 0 - 44 U/L - 17 -   No results found for: ACZ660 Lab Results  Component Value Date   WBC 8.6 05/08/2018   HGB 13.0 05/08/2018   HCT 40.8 05/08/2018   MCV 87.0 05/08/2018   PLT 171 05/08/2018   NEUTROABS 6.1 05/08/2018    ASSESSMENT:  1.  Invasive lobular carcinoma of the outer quadrant of the left breast: - Mammogram on 12/21/2020, BI-RADS 0 with possible distortion in the left breast. - Ultrasound/mammogram on 12/30/2020-persistent outer left breast distortion without sonographic correlate.  No lymphadenopathy  in the left axilla. - Biopsy on 01/15/2021 of the left breast outer quadrant-invasive lobular carcinoma, E-cadherin weakly positive.  ER-70%, PR-90%, HER2 1+, Ki-67 20%. - MRI of the breast on 02/23/2021 shows 2.2 x 2 x 1 cm irregular mass identified in the lower outer left breast compatible with malignancy.  No abnormal appearing lymph nodes.  Right breast was normal.  2.  Social/family history: - Lives with husband at home.  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 - Brother had prostate cancer.  Maternal aunt had breast cancer.   PLAN:  1.  Invasive lobular carcinoma of the outer quadrant of the left breast: - We reviewed results of MRI of the breast which showed 2.2 x 2 x 1 cm mass in the  lower outer left breast.  No adenopathy or other masses in the right breast. - Again we have discussed surgical resection as the best option for cure.  She has an appointment to see Dr. Arnoldo Morale soon. - If she decides not to undergo surgery, she was told to start taking anastrozole. - We discussed side effects of anastrozole in detail.  We have sent prescription to her pharmacy. - If she decides to proceed with surgery, she will follow-up with [place] 1 month after surgery.  2.  Bone health: - Bone density on 12/21/2020 with T score 0.4. - She was instructed to take calcium and vitamin D supplements.  Breast Cancer therapy associated bone loss: I have recommended calcium, Vitamin D and weight bearing exercises.  Orders placed this encounter:  No orders of the defined types were placed in this encounter.   The patient has a good understanding of the overall plan. She agrees with it. She will call with any problems that may develop before the next visit here.  Derek Jack, MD [place] [phone]   I, Thana Ates, am acting as a scribe for Dr. Derek Jack.  I, Derek Jack MD, have reviewed the above documentation for accuracy and completeness, and I agree with the above.

## 2021-02-25 ENCOUNTER — Inpatient Hospital Stay (HOSPITAL_COMMUNITY): Payer: Medicare Other | Attending: Hematology | Admitting: Hematology

## 2021-02-25 ENCOUNTER — Other Ambulatory Visit: Payer: Self-pay

## 2021-02-25 VITALS — BP 151/80 | HR 45 | Temp 97.7°F | Resp 18 | Wt 188.0 lb

## 2021-02-25 DIAGNOSIS — C50512 Malignant neoplasm of lower-outer quadrant of left female breast: Secondary | ICD-10-CM | POA: Diagnosis not present

## 2021-02-25 DIAGNOSIS — C50919 Malignant neoplasm of unspecified site of unspecified female breast: Secondary | ICD-10-CM | POA: Diagnosis not present

## 2021-02-25 MED ORDER — ANASTROZOLE 1 MG PO TABS: 1.0000 mg | ORAL_TABLET | Freq: Every day | ORAL | 11 refills | Status: DC

## 2021-02-25 NOTE — Patient Instructions (Addendum)
[facility] at [facility] Discharge Instructions  You were seen today by Dr. Katragadda. He went over your recent results and scans. Dr. Katragadda will see you back in 2 months for labs and follow up.   Thank you for choosing [facility] at [facility] to provide your oncology and hematology care.  To afford each patient quality time with our provider, please arrive at least 15 minutes before your scheduled appointment time.   If you have a lab appointment with the Cancer Center please come in thru the Main Entrance and check in at the main information desk  You need to re-schedule your appointment should you arrive 10 or more minutes late.  We strive to give you quality time with our providers, and arriving late affects you and other patients whose appointments are after yours.  Also, if you no show three or more times for appointments you may be dismissed from the clinic at the providers discretion.     Again, thank you for choosing [facility].  Our hope is that these requests will decrease the amount of time that you wait before being seen by our physicians.       _____________________________________________________________  Should you have questions after your visit to [facility], please contact our office at [phone] between the hours of 8:00 a.m. and 4:30 p.m.  Voicemails left after 4:00 p.m. will not be returned until the following business day.  For prescription refill requests, have your pharmacy contact our office and allow 72 hours.    Cancer Center Support Programs:   > Cancer Support Group  2nd Tuesday of the month 1pm-2pm, Journey Room   

## 2021-03-02 ENCOUNTER — Ambulatory Visit: Payer: Medicare Other | Admitting: General Surgery

## 2021-03-08 ENCOUNTER — Ambulatory Visit: Payer: Medicare Other | Admitting: Obstetrics & Gynecology

## 2021-03-11 ENCOUNTER — Ambulatory Visit: Payer: Medicare Other | Admitting: General Surgery

## 2021-03-11 ENCOUNTER — Other Ambulatory Visit: Payer: Self-pay

## 2021-03-11 ENCOUNTER — Encounter: Payer: Self-pay | Admitting: General Surgery

## 2021-03-11 VITALS — BP 153/76 | HR 45 | Temp 98.3°F | Resp 14 | Ht 66.0 in | Wt 184.0 lb

## 2021-03-11 DIAGNOSIS — C50912 Malignant neoplasm of unspecified site of left female breast: Secondary | ICD-10-CM

## 2021-03-11 NOTE — Progress Notes (Signed)
Subjective:     Leslie Barrera  Patient presents to discuss surgical options for her left breast cancer.  She has seen Dr. Delton Coombes and is starting antiestrogen therapy. Objective:    BP (!) 153/76   Pulse (!) 45   Temp 98.3 F (36.8 C) (Other (Comment))   Resp 14   Ht '5\' 6"'$  (1.676 m)   Wt 184 lb (83.5 kg)   SpO2 95%   BMI 29.70 kg/m   General:  alert, cooperative, and no distress  Dr. Tomie China note reviewed     Assessment:    Invasive lobular carcinoma of left breast, ER/PR positive    Plan:   All surgical options including simple mastectomy, partial mastectomy with radiation therapy, or just lumpectomy were fully explained to the patient.  She has elected to not undergo any surgical intervention at this time.  All her questions were answered.  I told her that we could revisit the surgical options in the future should she not have good results with her hormonal therapy.  She is following up with Dr. Delton Coombes in a month.  Follow-up here as needed.

## 2021-03-22 ENCOUNTER — Encounter: Payer: Self-pay | Admitting: Obstetrics & Gynecology

## 2021-03-22 ENCOUNTER — Ambulatory Visit: Payer: Medicare Other | Admitting: Obstetrics & Gynecology

## 2021-03-22 ENCOUNTER — Other Ambulatory Visit: Payer: Self-pay

## 2021-03-22 VITALS — BP 155/75 | HR 55 | Ht 66.0 in | Wt 185.0 lb

## 2021-03-22 DIAGNOSIS — N993 Prolapse of vaginal vault after hysterectomy: Secondary | ICD-10-CM

## 2021-03-22 DIAGNOSIS — Z4689 Encounter for fitting and adjustment of other specified devices: Secondary | ICD-10-CM

## 2021-03-22 NOTE — Progress Notes (Signed)
Chief Complaint  Patient presents with   Pessary Maintenance    Blood pressure (!) 155/75, pulse (!) 55, height 5\' 6"  (1.676 m), weight 185 lb (83.9 kg).  Leslie Barrera presents today for routine follow up related to her pessary.   She uses a Milex ring with support #4 She reports no vaginal discharge and no vaginal bleeding   Likert scale(1 not bothersome -5 very bothersome)  :  1  Exam reveals no undue vaginal mucosal pressure of breakdown, no discharge and no vaginal bleeding.  Vaginal Epithelial Abnormality Classification System:   0 0    No abnormalities 1    Epithelial erythema 2    Granulation tissue 3    Epithelial break or erosion, 1 cm or less 4    Epithelial break or erosion, 1 cm or greater  The pessary is removed, cleaned and replaced without difficulty.      ICD-10-CM   1. Pessary maintenance, Milex ring with support #4, original fit 04/2018  Z46.89     2. Complete prolapse of vaginal vault s/p hysterectomy  N99.3        Leslie Barrera will be sen back in 4 months for continued follow up.  Florian Buff, MD  03/22/2021 10:38 AM

## 2021-03-31 DIAGNOSIS — Z23 Encounter for immunization: Secondary | ICD-10-CM | POA: Diagnosis not present

## 2021-04-26 ENCOUNTER — Other Ambulatory Visit (HOSPITAL_COMMUNITY): Payer: Self-pay | Admitting: *Deleted

## 2021-04-26 DIAGNOSIS — C50919 Malignant neoplasm of unspecified site of unspecified female breast: Secondary | ICD-10-CM

## 2021-04-27 ENCOUNTER — Inpatient Hospital Stay (HOSPITAL_COMMUNITY): Payer: Medicare Other | Attending: Hematology

## 2021-04-27 ENCOUNTER — Other Ambulatory Visit: Payer: Self-pay

## 2021-04-27 DIAGNOSIS — Z79811 Long term (current) use of aromatase inhibitors: Secondary | ICD-10-CM | POA: Diagnosis not present

## 2021-04-27 DIAGNOSIS — C50512 Malignant neoplasm of lower-outer quadrant of left female breast: Secondary | ICD-10-CM | POA: Insufficient documentation

## 2021-04-27 DIAGNOSIS — C50919 Malignant neoplasm of unspecified site of unspecified female breast: Secondary | ICD-10-CM

## 2021-04-27 DIAGNOSIS — Z170 Estrogen receptor positive status [ER+]: Secondary | ICD-10-CM | POA: Diagnosis not present

## 2021-04-27 LAB — COMPREHENSIVE METABOLIC PANEL
ALT: 15 U/L (ref 0–44)
AST: 23 U/L (ref 15–41)
Albumin: 3.4 g/dL — ABNORMAL LOW (ref 3.5–5.0)
Alkaline Phosphatase: 42 U/L (ref 38–126)
Anion gap: 6 (ref 5–15)
BUN: 17 mg/dL (ref 8–23)
CO2: 29 mmol/L (ref 22–32)
Calcium: 9.3 mg/dL (ref 8.9–10.3)
Chloride: 103 mmol/L (ref 98–111)
Creatinine, Ser: 0.64 mg/dL (ref 0.44–1.00)
GFR, Estimated: >60 mL/min (ref >60)
Glucose, Bld: 112 mg/dL — ABNORMAL HIGH (ref 70–99)
Potassium: 3.3 mmol/L — ABNORMAL LOW (ref 3.5–5.1)
Sodium: 138 mmol/L (ref 135–145)
Total Bilirubin: 0.6 mg/dL (ref 0.3–1.2)
Total Protein: 7.7 g/dL (ref 6.5–8.1)

## 2021-04-27 LAB — CBC WITH DIFFERENTIAL/PLATELET
Abs Immature Granulocytes: 0.01 10*3/uL (ref 0.00–0.07)
Basophils Absolute: 0 10*3/uL (ref 0.0–0.1)
Basophils Relative: 0 %
Eosinophils Absolute: 0.2 10*3/uL (ref 0.0–0.5)
Eosinophils Relative: 3 %
HCT: 37.3 % (ref 36.0–46.0)
Hemoglobin: 12.5 g/dL (ref 12.0–15.0)
Immature Granulocytes: 0 %
Lymphocytes Relative: 25 %
Lymphs Abs: 1.7 10*3/uL (ref 0.7–4.0)
MCH: 29.2 pg (ref 26.0–34.0)
MCHC: 33.5 g/dL (ref 30.0–36.0)
MCV: 87.1 fL (ref 80.0–100.0)
Monocytes Absolute: 0.8 10*3/uL (ref 0.1–1.0)
Monocytes Relative: 11 %
Neutro Abs: 4.1 10*3/uL (ref 1.7–7.7)
Neutrophils Relative %: 61 %
Platelets: 169 10*3/uL (ref 150–400)
RBC: 4.28 MIL/uL (ref 3.87–5.11)
RDW: 14.5 % (ref 11.5–15.5)
WBC: 6.8 10*3/uL (ref 4.0–10.5)
nRBC: 0 % (ref 0.0–0.2)

## 2021-04-27 LAB — VITAMIN D 25 HYDROXY (VIT D DEFICIENCY, FRACTURES): Vit D, 25-Hydroxy: 22.55 ng/mL — ABNORMAL LOW (ref 30–100)

## 2021-05-04 ENCOUNTER — Ambulatory Visit (HOSPITAL_COMMUNITY): Payer: Medicare Other | Admitting: Hematology

## 2021-05-04 NOTE — Progress Notes (Signed)
[facility] [address] [place] [address]   Patient Care Team: Sharilyn Sites, MD as PCP - General (Family Medicine) Derek Jack, MD as Medical Oncologist (Oncology) Brien Mates, RN as Oncology Nurse Navigator (Oncology)  SUMMARY OF ONCOLOGIC HISTORY: Oncology History   No history exists.    CHIEF COMPLIANT: Follow-up of invasive lobular carcinoma of the outer quadrant of the left breast   INTERVAL HISTORY: Ms. Leslie Barrera is a 80 y.o. female here today for follow up of her invasive lobular carcinoma of the outer quadrant of the left breast. Her last visit was on 02/25/2021.   Today she reports feeling good. She is taking anastrozole and tolerating it well. She denies headaches, dry eyes, hot flashes, and joint pains.   REVIEW OF SYSTEMS:   Review of Systems  Constitutional:  Negative for appetite change and fatigue (75%).  Endocrine: Negative for hot flashes.  Musculoskeletal:  Negative for arthralgias.  Neurological:  Negative for headaches.  All other systems reviewed and are negative.  I have reviewed the past medical history, past surgical history, social history and family history with the patient and they are unchanged from previous note.   ALLERGIES:   is allergic to tetracycline.   MEDICATIONS:  Current Outpatient Medications  Medication Sig Dispense Refill   anastrozole (ARIMIDEX) 1 MG tablet Take 1 tablet (1 mg total) by mouth daily. 30 tablet 11   aspirin EC 81 MG tablet Take 81 mg by mouth daily.     bisoprolol-hydrochlorothiazide (ZIAC) 10-6.25 MG per tablet Take 1 tablet by mouth daily.      Calcium Carbonate (CALCIUM 600 PO) Take by mouth.     cholecalciferol (VITAMIN D) 25 MCG (1000 UNIT) tablet Take 1,000 Units by mouth daily.      furosemide (LASIX) 40 MG tablet Take 40 mg by mouth daily.      simvastatin (ZOCOR) 40 MG tablet Take 40 mg by mouth at bedtime.     No current facility-administered medications  for this visit.     PHYSICAL EXAMINATION: Performance status (ECOG): 1 - Symptomatic but completely ambulatory  Vitals:   05/05/21 1502  BP: (!) 144/68  Pulse: 61  Resp: 18  Temp: 97.8 F (36.6 C)  SpO2: 100%   Wt Readings from Last 3 Encounters:  05/05/21 183 lb 13.8 oz (83.4 kg)  03/22/21 185 lb (83.9 kg)  03/11/21 184 lb (83.5 kg)   Physical Exam Vitals reviewed.  Constitutional:      Appearance: Normal appearance.  Cardiovascular:     Rate and Rhythm: Normal rate and regular rhythm.     Pulses: Normal pulses.     Heart sounds: Normal heart sounds.  Pulmonary:     Effort: Pulmonary effort is normal.     Breath sounds: Normal breath sounds.  Chest:  Breasts:    Left: Normal. No inverted nipple, mass, nipple discharge, skin change or tenderness.  Lymphadenopathy:     Upper Body:     Left upper body: No supraclavicular, axillary or pectoral adenopathy.  Neurological:     General: No focal deficit present.     Mental Status: She is alert and oriented to person, place, and time.  Psychiatric:        Mood and Affect: Mood normal.        Behavior: Behavior normal.    Breast Exam Chaperone: Thana Ates     LABORATORY DATA:  I have reviewed the data as listed CMP Latest  Ref Rng & Units 04/27/2021 04/30/2020 05/08/2018  Glucose 70 - 99 mg/dL 112(H) 114(H) 88  BUN 8 - 23 mg/dL _0 Creatinine 0.44 - 1.00 mg/dL 0.64 0.74 0.55  Sodium 135 - 145 mmol/L 138 137 138  Potassium 3.5 - 5.1 mmol/L 3.3(L) 3.4(L) 3.8  Chloride 98 - 111 mmol/L 103 101 105  CO2 22 - 32 mmol/L _1 Calcium 8.9 - 10.3 mg/dL 9.3 9.2 8.8(L)  Total Protein 6.5 - 8.1 g/dL 7.7 - 7.6  Total Bilirubin 0.3 - 1.2 mg/dL 0.6 - 0.5  Alkaline Phos 38 - 126 U/L 42 - 54  AST 15 - 41 U/L 23 - 24  ALT 0 - 44 U/L 15 - 17   No results found for: UUE280 Lab Results  Component Value Date   WBC 6.8 04/27/2021   HGB 12.5 04/27/2021   HCT 37.3 04/27/2021   MCV 87.1 04/27/2021   PLT 169 04/27/2021    NEUTROABS 4.1 04/27/2021    ASSESSMENT:  1.  Invasive lobular carcinoma of the outer quadrant of the left breast: - Mammogram on 12/21/2020, BI-RADS 0 with possible distortion in the left breast. - Ultrasound/mammogram on 12/30/2020-persistent outer left breast distortion without sonographic correlate.  No lymphadenopathy in the left axilla. - Biopsy on 01/15/2021 of the left breast outer quadrant-invasive lobular carcinoma, E-cadherin weakly positive.  ER-70%, PR-90%, HER2 1+, Ki-67 20%. - MRI of the breast on 02/23/2021 shows 2.2 x 2 x 1 cm irregular mass identified in the lower outer left breast compatible with malignancy.  No abnormal appearing lymph nodes.  Right breast was normal. - She decided to not to have surgery. - Anastrozole started around 02/25/2021.  2.  Social/family history: - Lives with husband at home.  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 - Brother had prostate cancer.  Maternal aunt had breast cancer.   PLAN:  1.  Invasive lobular carcinoma of the outer quadrant of the left breast: - She is tolerating anastrozole very well. - Denies any hot flashes or musculoskeletal symptoms. - Left breast examination today, I did not palpate any mass.  Her mass was not clearly palpable even prior to start of anastrozole. - Reviewed labs which showed normal chemistries and CBC. - I have recommended follow-up in 3 months with repeat MRI of the left breast with and without gadolinium.  2.  Bone health: - Bone density on 12/21/2020 with T score 0.4. - She is taking calcium plus D once daily.  Her vitamin D level is low at 22.5. - She was instructed to take additional vitamin D 1000 units daily.   Breast Cancer therapy associated bone loss: I have recommended calcium, Vitamin D and weight bearing exercises.  Breast Cancer therapy associated bone loss: I have recommended calcium, Vitamin D and weight bearing exercises.  Orders placed this  encounter:  No orders of the defined types were placed in this encounter.   The patient has a good understanding of the overall plan. She agrees with it. She will call with any problems that may develop before the next visit here.  Derek Jack, MD [place] [phone]   I, Thana Ates, am acting as a scribe for Dr. Derek Jack.  I, Derek Jack MD, have reviewed the above documentation for accuracy and completeness, and I agree with the above.

## 2021-05-05 ENCOUNTER — Encounter (HOSPITAL_COMMUNITY): Payer: Self-pay | Admitting: Hematology

## 2021-05-05 ENCOUNTER — Inpatient Hospital Stay (HOSPITAL_COMMUNITY): Payer: Medicare Other | Admitting: Hematology

## 2021-05-05 ENCOUNTER — Other Ambulatory Visit: Payer: Self-pay

## 2021-05-05 VITALS — BP 144/68 | HR 61 | Temp 97.8°F | Resp 18 | Wt 183.9 lb

## 2021-05-05 DIAGNOSIS — C50919 Malignant neoplasm of unspecified site of unspecified female breast: Secondary | ICD-10-CM | POA: Diagnosis not present

## 2021-05-05 DIAGNOSIS — C50512 Malignant neoplasm of lower-outer quadrant of left female breast: Secondary | ICD-10-CM | POA: Diagnosis not present

## 2021-05-05 DIAGNOSIS — Z170 Estrogen receptor positive status [ER+]: Secondary | ICD-10-CM | POA: Diagnosis not present

## 2021-05-05 DIAGNOSIS — Z79811 Long term (current) use of aromatase inhibitors: Secondary | ICD-10-CM | POA: Diagnosis not present

## 2021-05-05 NOTE — Progress Notes (Signed)
MR Left breast W WO contrast with CAD ordered per Dr. Tomie China request.

## 2021-05-05 NOTE — Patient Instructions (Signed)
[facility] [place] at [facility] Discharge Instructions  You were seen and examined today by Dr. Delton Coombes. He wants you to increase your Vitamin D. Continue taking Anastrozole as prescribed once daily. He will repeat MRI of left breast before next appointment. Please follow up as scheduled in 3 months.   Thank you for choosing [place] at [facility] to provide your oncology and hematology care.  To afford each patient quality time with our provider, please arrive at least 15 minutes before your scheduled appointment time.   If you have a lab appointment with the [facility] please come in thru the Main Entrance and check in at the main information desk.  You need to re-schedule your appointment should you arrive 10 or more minutes late.  We strive to give you quality time with our providers, and arriving late affects you and other patients whose appointments are after yours.  Also, if you no show three or more times for appointments you may be dismissed from the clinic at the providers discretion.     Again, thank you for choosing [facility].  Our hope is that these requests will decrease the amount of time that you wait before being seen by our physicians.       _____________________________________________________________  Should you have questions after your visit to [facility], please contact our office at [phone] and follow the prompts.  Our office hours are 8:00 a.m. and 4:30 p.m. Monday - Friday.  Please note that voicemails left after 4:00 p.m. may not be returned until the following business day.  We are closed weekends and major holidays.  You do have access to a nurse 24-7, just call the main number to the clinic [phone] and do not press any options, hold on the line and a nurse will answer the phone.    For prescription refill requests, have your pharmacy contact our office and allow 72 hours.    Due  to Covid, you will need to wear a mask upon entering the hospital. If you do not have a mask, a mask will be given to you at the Main Entrance upon arrival. For doctor visits, patients may have 1 support person age 3 or older with them. For treatment visits, patients can not have anyone with them due to social distancing guidelines and our immunocompromised population.

## 2021-05-05 NOTE — Progress Notes (Signed)
Patient presents today for office visit with Dr. Delton Coombes. Patient reports taking anastrozole daily without missed dosages or complications.

## 2021-06-04 ENCOUNTER — Encounter: Payer: Self-pay | Admitting: Obstetrics & Gynecology

## 2021-06-04 ENCOUNTER — Other Ambulatory Visit: Payer: Self-pay

## 2021-06-04 ENCOUNTER — Ambulatory Visit: Payer: Medicare Other | Admitting: Obstetrics & Gynecology

## 2021-06-04 VITALS — BP 133/67 | HR 49 | Wt 182.0 lb

## 2021-06-04 DIAGNOSIS — Z4689 Encounter for fitting and adjustment of other specified devices: Secondary | ICD-10-CM | POA: Diagnosis not present

## 2021-06-04 DIAGNOSIS — N993 Prolapse of vaginal vault after hysterectomy: Secondary | ICD-10-CM

## 2021-06-04 NOTE — Progress Notes (Signed)
Chief Complaint  Patient presents with   Pessary Check    Blood pressure 133/67, pulse (!) 49, weight 182 lb (82.6 kg).  Leslie Barrera presents today for routine follow up related to her pessary.   She uses a Milex ring with support #4 She reports no vaginal discharge and little vaginal bleeding   Likert scale(1 not bothersome -5 very bothersome)  :  2  Exam reveals no undue vaginal mucosal pressure of breakdown, no discharge and no vaginal bleeding.  Vaginal Epithelial Abnormality Classification System:   0 0    No abnormalities 1    Epithelial erythema 2    Granulation tissue 3    Epithelial break or erosion, 1 cm or less 4    Epithelial break or erosion, 1 cm or greater  The pessary is removed, cleaned and replaced without difficulty.    No diagnosis found.   SAGE HAMMILL will be sen back in 4 months for continued follow up.  Florian Buff, MD  06/04/2021 9:52 AM

## 2021-07-22 ENCOUNTER — Ambulatory Visit: Payer: Medicare Other | Admitting: Obstetrics & Gynecology

## 2021-08-12 ENCOUNTER — Other Ambulatory Visit: Payer: Self-pay

## 2021-08-12 ENCOUNTER — Ambulatory Visit (HOSPITAL_COMMUNITY)
Admission: RE | Admit: 2021-08-12 | Discharge: 2021-08-12 | Disposition: A | Discharge status: Home / Self Care | Payer: Medicare Other | Source: Ambulatory Visit | Attending: Hematology | Admitting: Hematology

## 2021-08-12 DIAGNOSIS — N6489 Other specified disorders of breast: Secondary | ICD-10-CM | POA: Diagnosis not present

## 2021-08-12 DIAGNOSIS — C50919 Malignant neoplasm of unspecified site of unspecified female breast: Secondary | ICD-10-CM | POA: Diagnosis not present

## 2021-08-12 IMAGING — MR MR BREAST BILAT WO/W CM
6 of 9 series · 28 of 48 positions shown · IV contrast (8 GADAVIST)
Comparison: Previous exam(s). Previous breast MRI dated [DATE].

CLINICAL DATA: Patient has the diagnosis of invasive lobular
carcinoma, diagnosed with stereotactic core needle biopsy on
[DATE]. She underwent a follow-up MRI to assess for additional
disease. There was no evidence of additional disease. The patient
declined surgery, and is currently being treated with anastrozole.
Current exam is for surveillance.

EXAM:
BILATERAL BREAST MRI WITH AND WITHOUT CONTRAST
TECHNIQUE: Multiplanar, multisequence MR images of both breasts were obtained
prior to and following the intravenous administration of 8 ml of
Gadavist

[Series 2: T2 · axial · 3.0mm · 0.86mm/px · 1 of 54 slices shown]
[im 1/54]
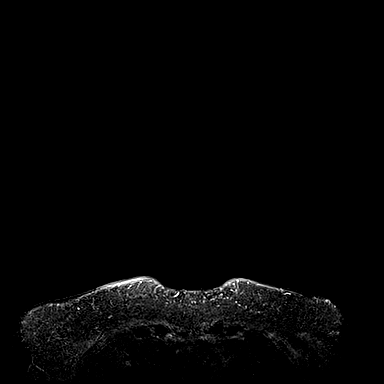

[Series 3: T1 fat-sat · axial · 1.2mm · 0.74mm/px · z∈[-58,+114]mm · 5 of 144 slices shown (1 of 4)]
[im 1/144]
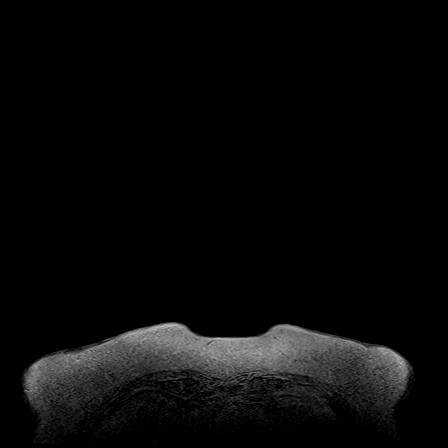
[im 36/144]
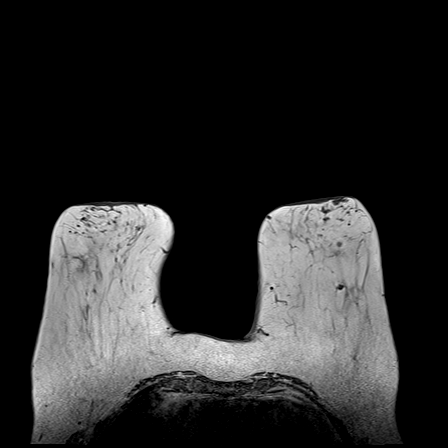
[im 72/144]
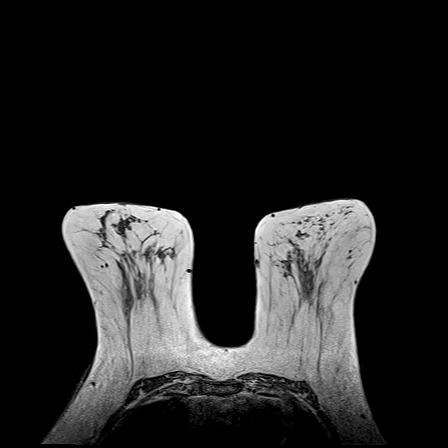
[im 108/144]
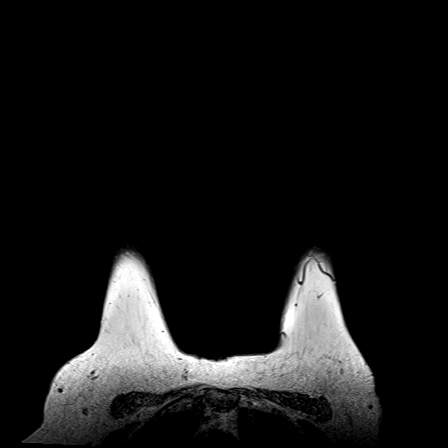
[im 144/144]
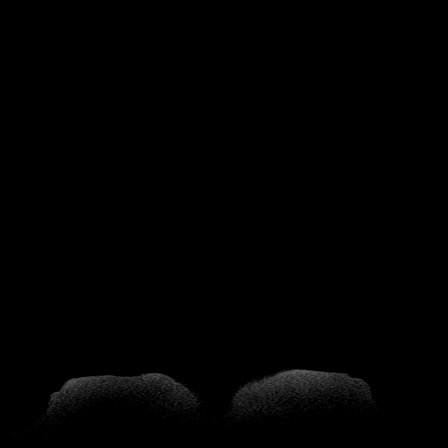

[Series 5: T1 fat-sat · axial · 1.2mm · 0.84mm/px · z∈[-58,+114]mm · 6 of 144 slices shown (2 of 4)]
[im 1/144]
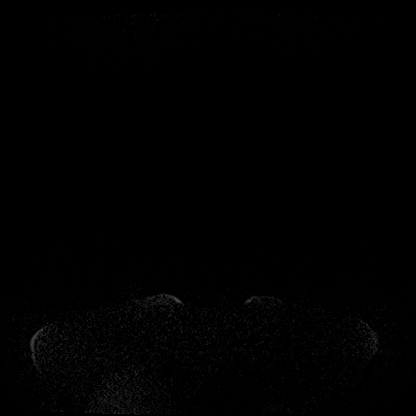
[im 29/144]
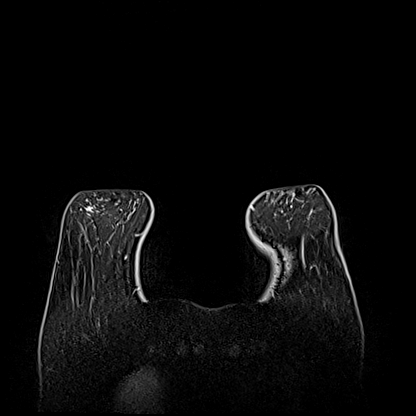
[im 58/144]
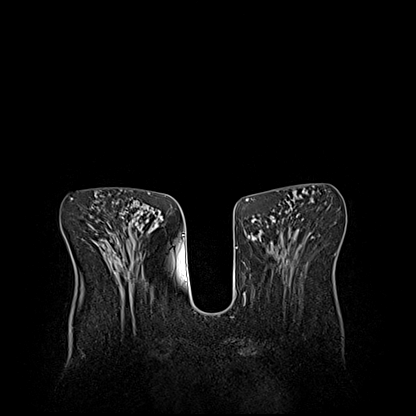
[im 86/144]
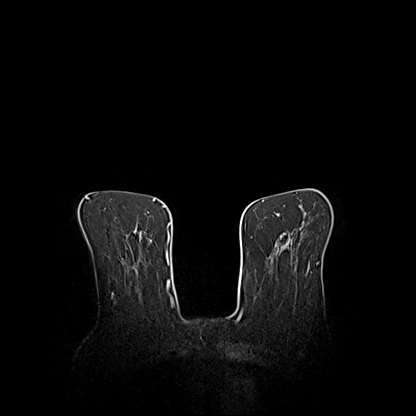
[im 115/144]
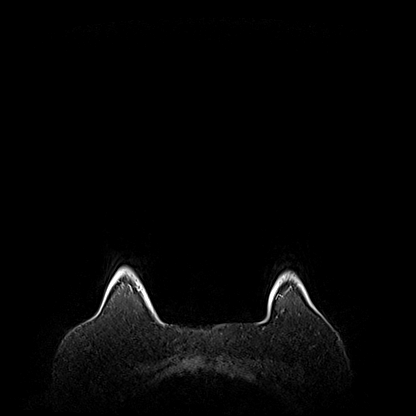
[im 144/144]
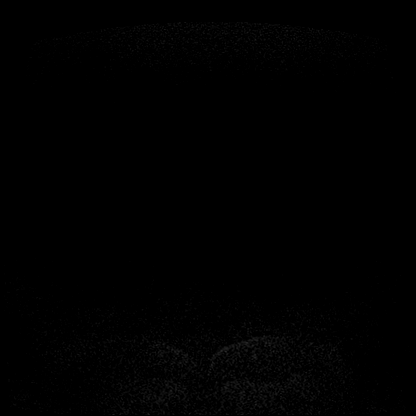

[Series 6: T1 fat-sat · axial · 1.2mm · 0.84mm/px · z∈[-58,+114]mm · 6 of 142 slices shown (3 of 4)]
[im 1/142]
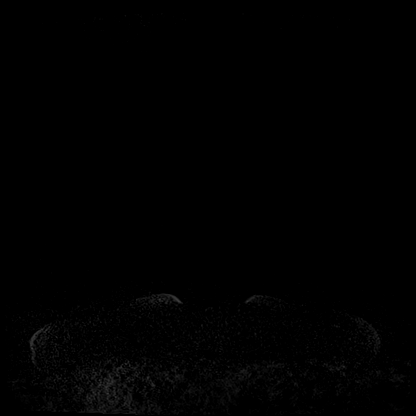
[im 29/142]
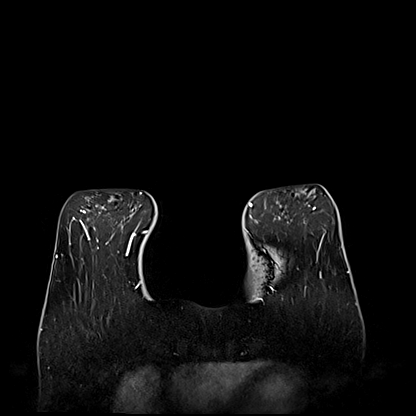
[im 57/142]
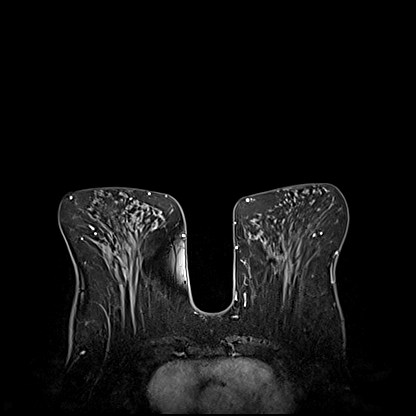
[im 85/142]
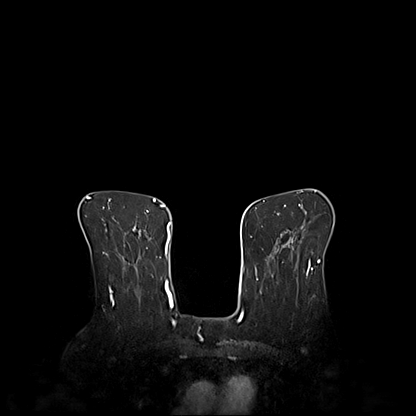
[im 113/142]
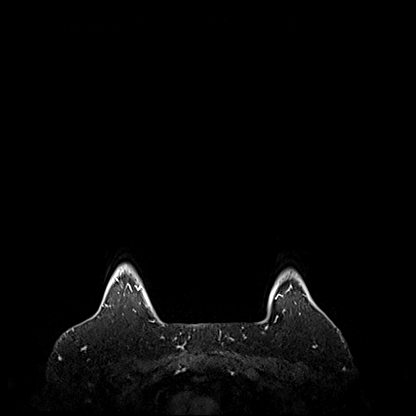
[im 142/142]
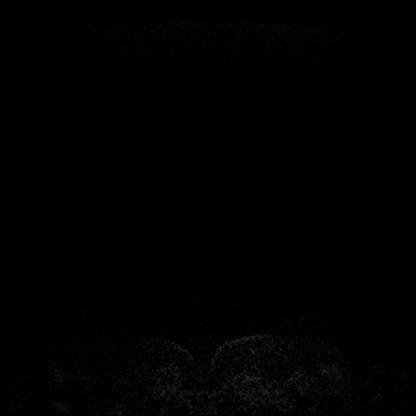

[Series 7: T1 · axial · 1.2mm · 0.84mm/px · z∈[-58,+114]mm · 6 of 144 slices shown]
[im 1/144]
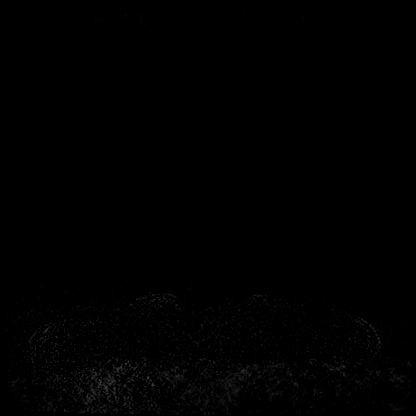
[im 29/144]
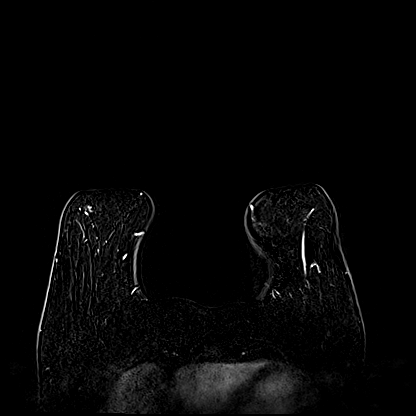
[im 58/144]
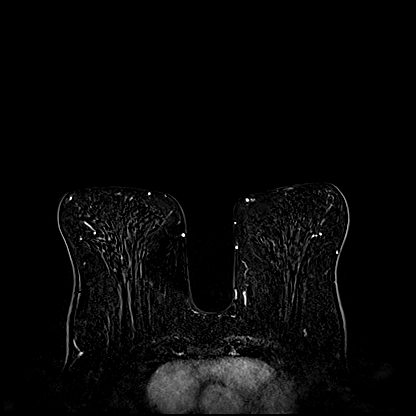
[im 86/144]
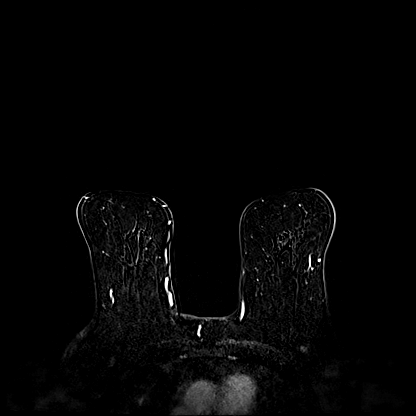
[im 115/144]
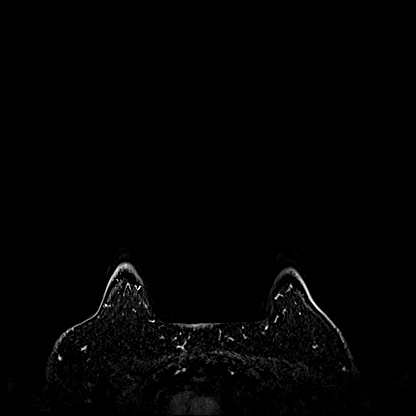
[im 144/144]
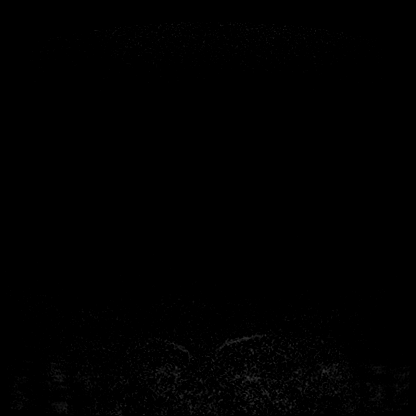

[Series 10: T1 fat-sat · axial · 1.2mm · 0.84mm/px · z∈[-58,+44]mm · 4 of 144 slices shown (4 of 4)]
[im 1/144]
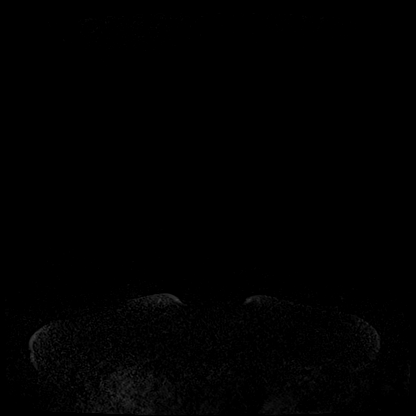
[im 29/144]
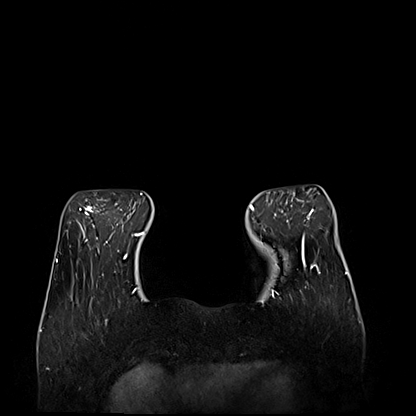
[im 58/144]
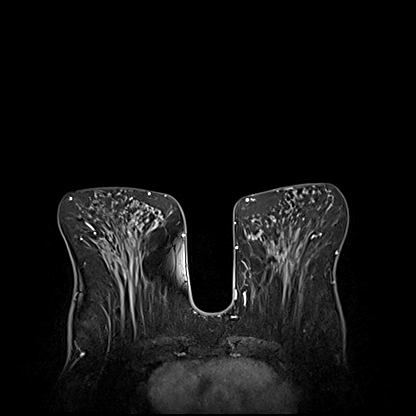
[im 86/144]
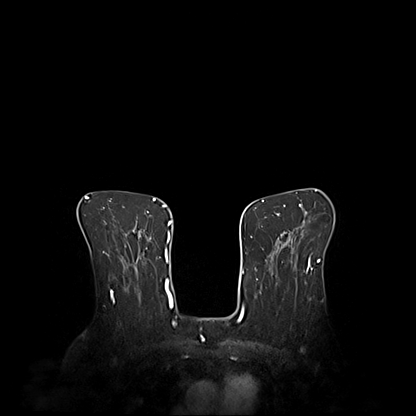

[28 of 48 positions shown; findings below may reference images not displayed]

Three-dimensional MR images were rendered by post-processing of the
original MR data on an independent workstation. The
three-dimensional MR images were interpreted, and findings are
reported in the following complete MRI report for this study. Three
dimensional images were evaluated at the independent interpreting
workstation using the DynaCAD thin client.
FINDINGS: Breast composition: b. Scattered fibroglandular tissue.

Background parenchymal enhancement: Mild

Right breast: No mass or abnormal enhancement.

Left breast: Irregular mass in the posterior, lower outer quadrant
currently measures 1.5 x 0.7 x 1.4 cm. Mass shows progressive
enhancement kinetics. When compared to the prior MRI the mass is
without significant change in size shape or enhancement
characteristics, with the current decreased measurements due to
differences in measurement technique only.

There are no new left breast masses or areas of abnormal
enhancement.

Lymph nodes: No abnormal appearing lymph nodes.

Ancillary findings:  None.
IMPRESSION: 1. No significant change or progression in the known left breast
invasive lobular carcinoma, current measurements 1.5 x 0.7 x 1.4 cm.
2. No other evidence of malignancy in the left breast.
3. No evidence of right breast malignancy.

RECOMMENDATION:
1. Continue current anastrozole treatment.
2. Recommend follow-up breast MRI in 6 months for reassessment.

BI-RADS CATEGORY  6: Known biopsy-proven malignancy.

## 2021-08-12 MED ORDER — GADOBUTROL 1 MMOL/ML IV SOLN
8.0000 mL | Freq: Once | INTRAVENOUS | Status: AC | PRN
  Administered 2021-08-12: 8 mL via INTRAVENOUS

## 2021-08-19 ENCOUNTER — Inpatient Hospital Stay (HOSPITAL_COMMUNITY): Payer: Medicare Other | Attending: Hematology | Admitting: Hematology

## 2021-08-19 ENCOUNTER — Other Ambulatory Visit (HOSPITAL_COMMUNITY): Payer: Self-pay | Admitting: *Deleted

## 2021-08-19 ENCOUNTER — Other Ambulatory Visit: Payer: Self-pay

## 2021-08-19 VITALS — BP 146/73 | HR 53 | Temp 98.8°F | Resp 18 | Ht 63.78 in | Wt 186.2 lb

## 2021-08-19 DIAGNOSIS — N63 Unspecified lump in unspecified breast: Secondary | ICD-10-CM

## 2021-08-19 DIAGNOSIS — C50919 Malignant neoplasm of unspecified site of unspecified female breast: Secondary | ICD-10-CM | POA: Diagnosis not present

## 2021-08-19 DIAGNOSIS — C50512 Malignant neoplasm of lower-outer quadrant of left female breast: Secondary | ICD-10-CM | POA: Insufficient documentation

## 2021-08-19 DIAGNOSIS — Z79811 Long term (current) use of aromatase inhibitors: Secondary | ICD-10-CM | POA: Diagnosis not present

## 2021-08-19 NOTE — Progress Notes (Signed)
[facility] [address], [place] [address]   Patient Care Team: Sharilyn Sites, MD as PCP - General (Family Medicine) Derek Jack, MD as Medical Oncologist (Oncology) Brien Mates, RN as Oncology Nurse Navigator (Oncology)  SUMMARY OF ONCOLOGIC HISTORY: Oncology History   No history exists.    CHIEF COMPLIANT: Follow-up of invasive lobular carcinoma of the outer quadrant of the left breast   INTERVAL HISTORY: Ms. Leslie Barrera is a 81 y.o. female here today for follow up of her invasive lobular carcinoma of the outer quadrant of the left breast. Her last visit was on 05/05/2021.   Today she reports feeling good. She is taking anastrozole and is tolerating it well. She reports 1-2 episodes of mild hot flashes. She is taking vitamin D and calcium. She stopped aspirin. She reports occasional ache in her arms in the morning. She is taking vitamin D once a day. Her appetite is good.   REVIEW OF SYSTEMS:   Review of Systems  Constitutional:  Negative for appetite change and fatigue.  Endocrine: Positive for hot flashes (x1-2).  Musculoskeletal:  Positive for arthralgias (arms).  All other systems reviewed and are negative.  I have reviewed the past medical history, past surgical history, social history and family history with the patient and they are unchanged from previous note.   ALLERGIES:   is allergic to tetracycline.   MEDICATIONS:  Current Outpatient Medications  Medication Sig Dispense Refill   anastrozole (ARIMIDEX) 1 MG tablet Take 1 tablet (1 mg total) by mouth daily. 30 tablet 11   aspirin EC 81 MG tablet Take 81 mg by mouth daily.     bisoprolol-hydrochlorothiazide (ZIAC) 10-6.25 MG per tablet Take 1 tablet by mouth daily.      Calcium Carbonate (CALCIUM 600 PO) Take by mouth.     cholecalciferol (VITAMIN D) 25 MCG (1000 UNIT) tablet Take 1,000 Units by mouth daily.      furosemide (LASIX) 40 MG tablet Take 40 mg by mouth  daily.      simvastatin (ZOCOR) 40 MG tablet Take 40 mg by mouth at bedtime.     No current facility-administered medications for this visit.     PHYSICAL EXAMINATION: Performance status (ECOG): 1 - Symptomatic but completely ambulatory  Vitals:   08/19/21 1150  BP: (!) 146/73  Pulse: (!) 53  Resp: 18  Temp: 98.8 F (37.1 C)  SpO2: 96%   Wt Readings from Last 3 Encounters:  08/19/21 186 lb 3.2 oz (84.5 kg)  06/04/21 182 lb (82.6 kg)  05/05/21 183 lb 13.8 oz (83.4 kg)   Physical Exam Vitals reviewed.  Constitutional:      Appearance: Normal appearance. She is obese.  Cardiovascular:     Rate and Rhythm: Normal rate and regular rhythm.     Pulses: Normal pulses.     Heart sounds: Normal heart sounds.  Pulmonary:     Effort: Pulmonary effort is normal.     Breath sounds: Normal breath sounds.  Neurological:     General: No focal deficit present.     Mental Status: She is alert and oriented to person, place, and time.  Psychiatric:        Mood and Affect: Mood normal.        Behavior: Behavior normal.    Breast Exam Chaperone: Thana Ates     LABORATORY DATA:  I have reviewed the data as listed CMP Latest Ref Rng & Units 04/27/2021 04/30/2020 05/08/2018  Glucose 70 - 99 mg/dL 112(H) 114(H) 88  BUN 8 - 23 mg/dL 17 20 14   Creatinine 0.44 - 1.00 mg/dL 0.64 0.74 0.55  Sodium 135 - 145 mmol/L 138 137 138  Potassium 3.5 - 5.1 mmol/L 3.3(L) 3.4(L) 3.8  Chloride 98 - 111 mmol/L 103 101 105  CO2 22 - 32 mmol/L 29 28 28   Calcium 8.9 - 10.3 mg/dL 9.3 9.2 8.8(L)  Total Protein 6.5 - 8.1 g/dL 7.7 - 7.6  Total Bilirubin 0.3 - 1.2 mg/dL 0.6 - 0.5  Alkaline Phos 38 - 126 U/L 42 - 54  AST 15 - 41 U/L 23 - 24  ALT 0 - 44 U/L 15 - 17   No results found for: PIR518 Lab Results  Component Value Date   WBC 6.8 04/27/2021   HGB 12.5 04/27/2021   HCT 37.3 04/27/2021   MCV 87.1 04/27/2021   PLT 169 04/27/2021   NEUTROABS 4.1 04/27/2021    ASSESSMENT:  1.  Invasive  lobular carcinoma of the outer quadrant of the left breast: - Mammogram on 12/21/2020, BI-RADS 0 with possible distortion in the left breast. - Ultrasound/mammogram on 12/30/2020-persistent outer left breast distortion without sonographic correlate.  No lymphadenopathy in the left axilla. - Biopsy on 01/15/2021 of the left breast outer quadrant-invasive lobular carcinoma, E-cadherin weakly positive.  ER-70%, PR-90%, HER2 1+, Ki-67 20%. - MRI of the breast on 02/23/2021 shows 2.2 x 2 x 1 cm irregular mass identified in the lower outer left breast compatible with malignancy.  No abnormal appearing lymph nodes.  Right breast was normal. - She decided to not to have surgery. - Anastrozole started around 02/25/2021.  2.  Social/family history: - Lives with husband at home.  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 - Brother had prostate cancer.  Maternal aunt had breast cancer.   PLAN:  1.  Invasive lobular carcinoma of the outer quadrant of the left breast: - She is tolerating anastrozole very well. - I have reviewed MRI of the breast from 08/12/2021.  Irregular mass in the posterior lower outer quadrant currently measures 1.5 x 0.7 x 1.4 cm.  No abnormal appearing lymph nodes. - Overall impression is no significant change or progression of the known left breast invasive lobular carcinoma.  No other evidence of malignancy. - Recommend continuing anastrozole once daily. - We will see her back in 6 months with repeat MRI and blood work.  2.  Bone health: - Bone density on 12/21/2020 with T score of 0.4. - She is taking calcium plus D once daily. - Last vitamin D was low at 22.  She will increase calcium plus D twice daily.  Breast Cancer therapy associated bone loss: I have recommended calcium, Vitamin D and weight bearing exercises.  Orders placed this encounter:  No orders of the defined types were placed in this encounter.   The patient has a good  understanding of the overall plan. She agrees with it. She will call with any problems that may develop before the next visit here.  Derek Jack, MD [place] [phone]   I, Thana Ates, am acting as a scribe for Dr. Derek Jack.  I, Derek Jack MD, have reviewed the above documentation for accuracy and completeness, and I agree with the above.

## 2021-08-19 NOTE — Patient Instructions (Signed)
[place] at [facility] Discharge Instructions   You were seen and examined today by Dr. Delton Coombes.  He reviewed the results of your MRI of the breast and lab work.  The results are normal/stable.  Continue anastrazole pill every day as treatment for your breast cancer.  Continue taking calcium + Vitamin D twice a day.  Return as scheduled in 6 months.    Thank you for choosing [place] at [facility] to provide your oncology and hematology care.  To afford each patient quality time with our provider, please arrive at least 15 minutes before your scheduled appointment time.   If you have a lab appointment with the [place] please come in thru the Main Entrance and check in at the main information desk.  You need to re-schedule your appointment should you arrive 10 or more minutes late.  We strive to give you quality time with our providers, and arriving late affects you and other patients whose appointments are after yours.  Also, if you no show three or more times for appointments you may be dismissed from the clinic at the providers discretion.     Again, thank you for choosing [facility].  Our hope is that these requests will decrease the amount of time that you wait before being seen by our physicians.       _____________________________________________________________  Should you have questions after your visit to [facility], please contact our office at [phone] and follow the prompts.  Our office hours are 8:00 a.m. and 4:30 p.m. Monday - Friday.  Please note that voicemails left after 4:00 p.m. may not be returned until the following business day.  We are closed weekends and major holidays.  You do have access to a nurse 24-7, just call the main number to the clinic [phone] and do not press any options, hold on the line and a nurse will answer the phone.    For prescription refill requests,  have your pharmacy contact our office and allow 72 hours.    Due to Covid, you will need to wear a mask upon entering the hospital. If you do not have a mask, a mask will be given to you at the Main Entrance upon arrival. For doctor visits, patients may have 1 support person age 69 or older with them. For treatment visits, patients can not have anyone with them due to social distancing guidelines and our immunocompromised population.

## 2021-10-04 ENCOUNTER — Ambulatory Visit: Payer: Medicare Other | Admitting: Obstetrics & Gynecology

## 2021-10-04 ENCOUNTER — Encounter: Payer: Self-pay | Admitting: Obstetrics & Gynecology

## 2021-10-04 VITALS — BP 143/72 | HR 52 | Wt 186.0 lb

## 2021-10-04 DIAGNOSIS — Z4689 Encounter for fitting and adjustment of other specified devices: Secondary | ICD-10-CM | POA: Diagnosis not present

## 2021-10-04 DIAGNOSIS — N993 Prolapse of vaginal vault after hysterectomy: Secondary | ICD-10-CM

## 2021-10-04 NOTE — Progress Notes (Signed)
Chief Complaint  ?Patient presents with  ? Pessary Check  ? ? ?Blood pressure (!) 143/72, pulse (!) 52, weight 186 lb (84.4 kg). ? ?Leslie Barrera presents today for routine follow up related to her pessary.   ?She uses a Milex ring with support #4 ?She reports no vaginal discharge and no vaginal bleeding  ? ?Likert scale(1 not bothersome -5 very bothersome)  :  1 ? ?Exam reveals no undue vaginal mucosal pressure of breakdown, no discharge and no vaginal bleeding. ? ?Vaginal Epithelial Abnormality Classification System:   0 ?0    No abnormalities ?1    Epithelial erythema ?2    Granulation tissue ?3    Epithelial break or erosion, 1 cm or less ?4    Epithelial break or erosion, 1 cm or greater ? ?The pessary is removed, cleaned and replaced without difficulty.   ? ?  ICD-10-CM   ?1. Pessary maintenance, Milex ring with support #4, original fit 04/2018  Z46.89   ?  ?2. Complete prolapse of vaginal vault s/p hysterectomy  N99.3   ?  ?  ? ?Leslie Barrera will be sen back in 4 months for continued follow up. ? ?Florian Buff, MD  ?10/04/2021 ?10:54 AM ? ? ? ?

## 2021-10-05 DIAGNOSIS — I10 Essential (primary) hypertension: Secondary | ICD-10-CM | POA: Diagnosis not present

## 2021-10-05 DIAGNOSIS — E119 Type 2 diabetes mellitus without complications: Secondary | ICD-10-CM | POA: Diagnosis not present

## 2021-10-05 DIAGNOSIS — E7849 Other hyperlipidemia: Secondary | ICD-10-CM | POA: Diagnosis not present

## 2021-10-05 DIAGNOSIS — E559 Vitamin D deficiency, unspecified: Secondary | ICD-10-CM | POA: Diagnosis not present

## 2021-10-05 DIAGNOSIS — Z0001 Encounter for general adult medical examination with abnormal findings: Secondary | ICD-10-CM | POA: Diagnosis not present

## 2021-10-05 DIAGNOSIS — E782 Mixed hyperlipidemia: Secondary | ICD-10-CM | POA: Diagnosis not present

## 2021-10-05 DIAGNOSIS — R946 Abnormal results of thyroid function studies: Secondary | ICD-10-CM | POA: Diagnosis not present

## 2021-10-27 DIAGNOSIS — Z1211 Encounter for screening for malignant neoplasm of colon: Secondary | ICD-10-CM | POA: Diagnosis not present

## 2021-11-03 DIAGNOSIS — D649 Anemia, unspecified: Secondary | ICD-10-CM | POA: Diagnosis not present

## 2021-11-03 DIAGNOSIS — E559 Vitamin D deficiency, unspecified: Secondary | ICD-10-CM | POA: Diagnosis not present

## 2021-12-03 ENCOUNTER — Encounter: Payer: Self-pay | Admitting: Obstetrics & Gynecology

## 2021-12-21 DIAGNOSIS — B369 Superficial mycosis, unspecified: Secondary | ICD-10-CM | POA: Diagnosis not present

## 2021-12-21 DIAGNOSIS — S80822A Blister (nonthermal), left lower leg, initial encounter: Secondary | ICD-10-CM | POA: Diagnosis not present

## 2021-12-30 DIAGNOSIS — S90522A Blister (nonthermal), left ankle, initial encounter: Secondary | ICD-10-CM | POA: Diagnosis not present

## 2022-02-03 ENCOUNTER — Ambulatory Visit: Payer: Medicare Other | Admitting: Obstetrics & Gynecology

## 2022-02-08 ENCOUNTER — Ambulatory Visit: Payer: Medicare Other | Admitting: Obstetrics & Gynecology

## 2022-02-08 ENCOUNTER — Encounter: Payer: Self-pay | Admitting: Obstetrics & Gynecology

## 2022-02-08 VITALS — BP 157/78 | HR 53

## 2022-02-08 DIAGNOSIS — Z4689 Encounter for fitting and adjustment of other specified devices: Secondary | ICD-10-CM

## 2022-02-08 DIAGNOSIS — N993 Prolapse of vaginal vault after hysterectomy: Secondary | ICD-10-CM

## 2022-02-08 NOTE — Progress Notes (Signed)
Chief Complaint  Patient presents with   Pessary Check    Blood pressure (!) 157/78, pulse (!) 53.  Leslie Barrera presents today for routine follow up related to her pessary.   She uses a Milex ring with support #4 She reports no vaginal discharge and no vaginal bleeding   Likert scale(1 not bothersome -5 very bothersome)  :  1  Exam reveals no undue vaginal mucosal pressure of breakdown, no discharge and no vaginal bleeding.  Vaginal Epithelial Abnormality Classification System:   0 0    No abnormalities 1    Epithelial erythema 2    Granulation tissue 3    Epithelial break or erosion, 1 cm or less 4    Epithelial break or erosion, 1 cm or greater  The pessary is removed, cleaned and replaced without difficulty.      ICD-10-CM   1. Pessary maintenance, Milex ring with support #4, original fit 04/2018  Z46.89     2. Complete prolapse of vaginal vault s/p hysterectomy  N99.3        Leslie Barrera will be sen back in 4 months for continued follow up.  Florian Buff, MD  02/08/2022 10:38 AM

## 2022-02-09 ENCOUNTER — Ambulatory Visit (HOSPITAL_COMMUNITY)
Admission: RE | Admit: 2022-02-09 | Discharge: 2022-02-09 | Disposition: A | Discharge status: Home / Self Care | Payer: Medicare Other | Source: Ambulatory Visit | Attending: Hematology | Admitting: Hematology

## 2022-02-09 DIAGNOSIS — Z0389 Encounter for observation for other suspected diseases and conditions ruled out: Secondary | ICD-10-CM | POA: Diagnosis not present

## 2022-02-09 DIAGNOSIS — N63 Unspecified lump in unspecified breast: Secondary | ICD-10-CM | POA: Insufficient documentation

## 2022-02-09 MED ORDER — GADOBUTROL 1 MMOL/ML IV SOLN
8.5000 mL | Freq: Once | INTRAVENOUS | Status: AC | PRN
  Administered 2022-02-09: 8.5 mL via INTRAVENOUS

## 2022-02-14 ENCOUNTER — Other Ambulatory Visit: Payer: Medicare Other

## 2022-02-15 ENCOUNTER — Inpatient Hospital Stay: Payer: Medicare Other | Attending: Hematology

## 2022-02-15 DIAGNOSIS — C50312 Malignant neoplasm of lower-inner quadrant of left female breast: Secondary | ICD-10-CM | POA: Insufficient documentation

## 2022-02-15 DIAGNOSIS — Z170 Estrogen receptor positive status [ER+]: Secondary | ICD-10-CM | POA: Insufficient documentation

## 2022-02-15 DIAGNOSIS — C50919 Malignant neoplasm of unspecified site of unspecified female breast: Secondary | ICD-10-CM

## 2022-02-15 DIAGNOSIS — Z79811 Long term (current) use of aromatase inhibitors: Secondary | ICD-10-CM | POA: Diagnosis not present

## 2022-02-15 LAB — CBC WITH DIFFERENTIAL/PLATELET
Abs Immature Granulocytes: 0.02 10*3/uL (ref 0.00–0.07)
Basophils Absolute: 0 10*3/uL (ref 0.0–0.1)
Basophils Relative: 0 %
Eosinophils Absolute: 0.1 10*3/uL (ref 0.0–0.5)
Eosinophils Relative: 2 %
HCT: 37.2 % (ref 36.0–46.0)
Hemoglobin: 12.4 g/dL (ref 12.0–15.0)
Immature Granulocytes: 0 %
Lymphocytes Relative: 22 %
Lymphs Abs: 1.7 10*3/uL (ref 0.7–4.0)
MCH: 28.5 pg (ref 26.0–34.0)
MCHC: 33.3 g/dL (ref 30.0–36.0)
MCV: 85.5 fL (ref 80.0–100.0)
Monocytes Absolute: 1 10*3/uL (ref 0.1–1.0)
Monocytes Relative: 13 %
Neutro Abs: 5 10*3/uL (ref 1.7–7.7)
Neutrophils Relative %: 63 %
Platelets: 188 10*3/uL (ref 150–400)
RBC: 4.35 MIL/uL (ref 3.87–5.11)
RDW: 14 % (ref 11.5–15.5)
WBC: 7.8 10*3/uL (ref 4.0–10.5)
nRBC: 0 % (ref 0.0–0.2)

## 2022-02-15 LAB — COMPREHENSIVE METABOLIC PANEL
ALT: 14 U/L (ref 0–44)
AST: 23 U/L (ref 15–41)
Albumin: 3.5 g/dL (ref 3.5–5.0)
Alkaline Phosphatase: 51 U/L (ref 38–126)
Anion gap: 8 (ref 5–15)
BUN: 21 mg/dL (ref 8–23)
CO2: 28 mmol/L (ref 22–32)
Calcium: 9.5 mg/dL (ref 8.9–10.3)
Chloride: 100 mmol/L (ref 98–111)
Creatinine, Ser: 0.85 mg/dL (ref 0.44–1.00)
GFR, Estimated: >60 mL/min (ref >60)
Glucose, Bld: 116 mg/dL — ABNORMAL HIGH (ref 70–99)
Potassium: 3.5 mmol/L (ref 3.5–5.1)
Sodium: 136 mmol/L (ref 135–145)
Total Bilirubin: 0.6 mg/dL (ref 0.3–1.2)
Total Protein: 8.2 g/dL — ABNORMAL HIGH (ref 6.5–8.1)

## 2022-02-15 LAB — VITAMIN D 25 HYDROXY (VIT D DEFICIENCY, FRACTURES): Vit D, 25-Hydroxy: 66.29 ng/mL (ref 30–100)

## 2022-02-21 ENCOUNTER — Inpatient Hospital Stay: Payer: Medicare Other | Admitting: Hematology

## 2022-02-21 VITALS — BP 147/65 | HR 48 | Temp 98.7°F | Resp 17 | Ht 66.5 in | Wt 178.7 lb

## 2022-02-21 DIAGNOSIS — Z170 Estrogen receptor positive status [ER+]: Secondary | ICD-10-CM | POA: Diagnosis not present

## 2022-02-21 DIAGNOSIS — C50312 Malignant neoplasm of lower-inner quadrant of left female breast: Secondary | ICD-10-CM | POA: Diagnosis not present

## 2022-02-21 DIAGNOSIS — C50919 Malignant neoplasm of unspecified site of unspecified female breast: Secondary | ICD-10-CM | POA: Diagnosis not present

## 2022-02-21 DIAGNOSIS — Z79811 Long term (current) use of aromatase inhibitors: Secondary | ICD-10-CM | POA: Diagnosis not present

## 2022-02-21 NOTE — Progress Notes (Signed)
[place] [address], [place] [address]   Patient Care Team: Sharilyn Sites, MD as PCP - General (Family Medicine) Derek Jack, MD as Medical Oncologist (Oncology) Brien Mates, RN as Oncology Nurse Navigator (Oncology)  SUMMARY OF ONCOLOGIC HISTORY: Oncology History   No history exists.    CHIEF COMPLIANT: Follow-up of invasive lobular carcinoma of the outer quadrant of the left breast   INTERVAL HISTORY: Ms. Leslie Barrera is a 81 y.o. female here for follow-up of her left breast cancer.  She is tolerating anastrozole reasonably well.  She has minor hot flashes.  She is continuing to take calcium and vitamin D supplements.  REVIEW OF SYSTEMS:   Review of Systems  Constitutional:  Negative for appetite change and fatigue.  Endocrine: Positive for hot flashes (x1-2).  Musculoskeletal:  Negative for arthralgias.  All other systems reviewed and are negative.   I have reviewed the past medical history, past surgical history, social history and family history with the patient and they are unchanged from previous note.   ALLERGIES:   is allergic to tetracycline.   MEDICATIONS:  Current Outpatient Medications  Medication Sig Dispense Refill   anastrozole (ARIMIDEX) 1 MG tablet Take 1 tablet (1 mg total) by mouth daily. 30 tablet 11   bisoprolol-hydrochlorothiazide (ZIAC) 10-6.25 MG per tablet Take 1 tablet by mouth daily.      Calcium Carbonate (CALCIUM 600 PO) Take by mouth.     cholecalciferol (VITAMIN D) 25 MCG (1000 UNIT) tablet Take 1,000 Units by mouth daily.      clobetasol cream (TEMOVATE) 0.05 % Apply topically 2 (two) times daily as needed.     clotrimazole (LOTRIMIN) 1 % cream Apply topically 2 (two) times daily.     furosemide (LASIX) 40 MG tablet Take 40 mg by mouth daily.      simvastatin (ZOCOR) 40 MG tablet Take 40 mg by mouth at bedtime.     Vitamin D, Ergocalciferol, (DRISDOL) 1.25 MG (50000 UNIT) CAPS capsule Take by  mouth.     No current facility-administered medications for this visit.     PHYSICAL EXAMINATION: Performance status (ECOG): 1 - Symptomatic but completely ambulatory  Vitals:   02/21/22 1139  BP: (!) 147/65  Pulse: (!) 48  Resp: 17  Temp: 98.7 F (37.1 C)  SpO2: 99%   Wt Readings from Last 3 Encounters:  02/21/22 178 lb 11.2 oz (81.1 kg)  10/04/21 186 lb (84.4 kg)  08/19/21 186 lb 3.2 oz (84.5 kg)   Physical Exam Vitals reviewed.  Constitutional:      Appearance: Normal appearance. She is obese.  Cardiovascular:     Rate and Rhythm: Normal rate and regular rhythm.     Pulses: Normal pulses.     Heart sounds: Normal heart sounds.  Pulmonary:     Effort: Pulmonary effort is normal.     Breath sounds: Normal breath sounds.  Neurological:     General: No focal deficit present.     Mental Status: She is alert and oriented to person, place, and time.  Psychiatric:        Mood and Affect: Mood normal.        Behavior: Behavior normal.     Breast Exam Chaperone: Thana Ates     LABORATORY DATA:  I have reviewed the data as listed    Latest Ref Rng & Units 02/15/2022   10:57 AM 04/27/2021   10:57 AM 04/30/2020    2:52 PM  CMP  Glucose 70 - 99 mg/dL 116  112  114   BUN 8 - 23 mg/dL _0 Creatinine 0.44 - 1.00 mg/dL 0.85  0.64  0.74   Sodium 135 - 145 mmol/L 136  138  137   Potassium 3.5 - 5.1 mmol/L 3.5  3.3  3.4   Chloride 98 - 111 mmol/L 100  103  101   CO2 22 - 32 mmol/L _1 Calcium 8.9 - 10.3 mg/dL 9.5  9.3  9.2   Total Protein 6.5 - 8.1 g/dL 8.2  7.7    Total Bilirubin 0.3 - 1.2 mg/dL 0.6  0.6    Alkaline Phos 38 - 126 U/L 51  42    AST 15 - 41 U/L 23  23    ALT 0 - 44 U/L 14  15     No results found for: "CAN153" Lab Results  Component Value Date   WBC 7.8 02/15/2022   HGB 12.4 02/15/2022   HCT 37.2 02/15/2022   MCV 85.5 02/15/2022   PLT 188 02/15/2022   NEUTROABS 5.0 02/15/2022    ASSESSMENT:  1.  Invasive lobular carcinoma  of the outer quadrant of the left breast: - Mammogram on 12/21/2020, BI-RADS 0 with possible distortion in the left breast. - Ultrasound/mammogram on 12/30/2020-persistent outer left breast distortion without sonographic correlate.  No lymphadenopathy in the left axilla. - Biopsy on 01/15/2021 of the left breast outer quadrant-invasive lobular carcinoma, E-cadherin weakly positive.  ER-70%, PR-90%, HER2 1+, Ki-67 20%. - MRI of the breast on 02/23/2021 shows 2.2 x 2 x 1 cm irregular mass identified in the lower outer left breast compatible with malignancy.  No abnormal appearing lymph nodes.  Right breast was normal. - She decided to not to have surgery. - Anastrozole started around 02/25/2021.  2.  Social/family history: - Lives with husband at home.  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 - Brother had prostate cancer.  Maternal aunt had breast cancer.   PLAN:  1.  Invasive lobular carcinoma of the outer quadrant of the left breast: - She is tolerating anastrozole except minor hot flashes. - Labs from 02/15/2022 shows normal LFTs and CBC. - MRI breast (02/09/2022): Stable to slight decrease in size of biopsy-proven invasive lobular carcinoma in the lower outer quadrant of the left breast measuring 1.5 x 1 x 0.7 cm.  No pathologic adenopathy.  Right breast is clear. - She is continuing to get response from anastrozole.  Continue anastrozole daily.  RTC 6 months with repeat left breast MRI with and without contrast and labs.  2.  Bone health: - Bone density on 12/21/2020 T score of 0.4. - Continue vitamin D 2000 units daily.  Vitamin D level is normal at 66.  Breast Cancer therapy associated bone loss: I have recommended calcium, Vitamin D and weight bearing exercises.  Orders placed this encounter:  Orders Placed This Encounter  Procedures   MR BREAST LEFT [address] [place] CAD   CBC with Differential   Comprehensive metabolic panel   VITAMIN D 25  Hydroxy (Vit-D Deficiency, Fractures)     The patient has a good understanding of the overall plan. She agrees with it. She will call with any problems that may develop before the next visit here.  Derek Jack, MD [place] [phone]

## 2022-02-21 NOTE — Patient Instructions (Signed)
[place] at [facility] Discharge Instructions   You were seen and examined today by Dr. Delton Coombes.  He reviewed the results of your lab work and MRI which are normal/stable.   We will repeat an MRI in 6 months.  Return as scheduled.    Thank you for choosing [place] at [facility] to provide your oncology and hematology care.  To afford each patient quality time with our provider, please arrive at least 15 minutes before your scheduled appointment time.   If you have a lab appointment with the [place] please come in thru the Main Entrance and check in at the main information desk.  You need to re-schedule your appointment should you arrive 10 or more minutes late.  We strive to give you quality time with our providers, and arriving late affects you and other patients whose appointments are after yours.  Also, if you no show three or more times for appointments you may be dismissed from the clinic at the providers discretion.     Again, thank you for choosing [facility].  Our hope is that these requests will decrease the amount of time that you wait before being seen by our physicians.       _____________________________________________________________  Should you have questions after your visit to [facility], please contact our office at [phone] and follow the prompts.  Our office hours are 8:00 a.m. and 4:30 p.m. Monday - Friday.  Please note that voicemails left after 4:00 p.m. may not be returned until the following business day.  We are closed weekends and major holidays.  You do have access to a nurse 24-7, just call the main number to the clinic [phone] and do not press any options, hold on the line and a nurse will answer the phone.    For prescription refill requests, have your pharmacy contact our office and allow 72 hours.    Due to Covid, you will need to wear a mask upon entering  the hospital. If you do not have a mask, a mask will be given to you at the Main Entrance upon arrival. For doctor visits, patients may have 1 support person age 42 or older with them. For treatment visits, patients can not have anyone with them due to social distancing guidelines and our immunocompromised population.

## 2022-03-19 ENCOUNTER — Other Ambulatory Visit (HOSPITAL_COMMUNITY): Payer: Self-pay | Admitting: Hematology

## 2022-03-21 ENCOUNTER — Other Ambulatory Visit: Payer: Self-pay | Admitting: *Deleted

## 2022-03-21 NOTE — Telephone Encounter (Signed)
Anastrozole refill approved.  Patient tolerating and is to continue therapy. 

## 2022-03-28 DIAGNOSIS — M654 Radial styloid tenosynovitis [de Quervain]: Secondary | ICD-10-CM | POA: Diagnosis not present

## 2022-03-28 DIAGNOSIS — Z23 Encounter for immunization: Secondary | ICD-10-CM | POA: Diagnosis not present

## 2022-03-28 DIAGNOSIS — M19041 Primary osteoarthritis, right hand: Secondary | ICD-10-CM | POA: Diagnosis not present

## 2022-04-20 ENCOUNTER — Other Ambulatory Visit: Payer: Self-pay | Admitting: Hematology

## 2022-06-10 ENCOUNTER — Ambulatory Visit: Payer: Medicare Other | Admitting: Obstetrics & Gynecology

## 2022-06-10 ENCOUNTER — Encounter: Payer: Self-pay | Admitting: Obstetrics & Gynecology

## 2022-06-10 VITALS — BP 138/73 | HR 58 | Ht 66.0 in | Wt 183.5 lb

## 2022-06-10 DIAGNOSIS — Z4689 Encounter for fitting and adjustment of other specified devices: Secondary | ICD-10-CM | POA: Diagnosis not present

## 2022-06-10 DIAGNOSIS — N993 Prolapse of vaginal vault after hysterectomy: Secondary | ICD-10-CM

## 2022-06-10 NOTE — Progress Notes (Signed)
Chief Complaint  Patient presents with   Pessary Check    Blood pressure 138/73, pulse (!) 58, height '5\' 6"'$  (1.676 m), weight 183 lb 8 oz (83.2 kg).  Leslie Barrera presents today for routine follow up related to her pessary.   She uses a Milex ring with support #4 She reports no vaginal discharge and no vaginal bleeding   Likert scale(1 not bothersome -5 very bothersome)  :  1  Exam reveals no undue vaginal mucosal pressure of breakdown, no discharge and no vaginal bleeding.  Vaginal Epithelial Abnormality Classification System:   0 0    No abnormalities 1    Epithelial erythema 2    Granulation tissue 3    Epithelial break or erosion, 1 cm or less 4    Epithelial break or erosion, 1 cm or greater  The pessary is removed, cleaned and replaced without difficulty.      ICD-10-CM   1. Pessary maintenance, Milex ring with support #4, original fit 04/2018  Z46.89     2. Complete prolapse of vaginal vault s/p hysterectomy  N99.3        Leslie Barrera will be sen back in 4 months for continued follow up.  Florian Buff, MD  06/10/2022 10:06 AM

## 2022-08-16 ENCOUNTER — Ambulatory Visit (HOSPITAL_COMMUNITY)
Admission: RE | Admit: 2022-08-16 | Discharge: 2022-08-16 | Disposition: A | Discharge status: Home / Self Care | Payer: Medicare Other | Source: Ambulatory Visit | Attending: Hematology | Admitting: Hematology

## 2022-08-16 DIAGNOSIS — C50919 Malignant neoplasm of unspecified site of unspecified female breast: Secondary | ICD-10-CM | POA: Diagnosis not present

## 2022-08-16 DIAGNOSIS — C50912 Malignant neoplasm of unspecified site of left female breast: Secondary | ICD-10-CM | POA: Diagnosis not present

## 2022-08-16 MED ORDER — GADOBUTROL 1 MMOL/ML IV SOLN
8.0000 mL | Freq: Once | INTRAVENOUS | Status: AC | PRN
  Administered 2022-08-16: 8 mL via INTRAVENOUS

## 2022-08-17 ENCOUNTER — Inpatient Hospital Stay: Payer: Medicare Other | Attending: Hematology

## 2022-08-17 DIAGNOSIS — C50919 Malignant neoplasm of unspecified site of unspecified female breast: Secondary | ICD-10-CM

## 2022-08-17 DIAGNOSIS — Z79899 Other long term (current) drug therapy: Secondary | ICD-10-CM | POA: Diagnosis not present

## 2022-08-17 DIAGNOSIS — R232 Flushing: Secondary | ICD-10-CM | POA: Diagnosis not present

## 2022-08-17 DIAGNOSIS — Z803 Family history of malignant neoplasm of breast: Secondary | ICD-10-CM | POA: Insufficient documentation

## 2022-08-17 DIAGNOSIS — Z170 Estrogen receptor positive status [ER+]: Secondary | ICD-10-CM | POA: Diagnosis not present

## 2022-08-17 DIAGNOSIS — Z79811 Long term (current) use of aromatase inhibitors: Secondary | ICD-10-CM | POA: Diagnosis not present

## 2022-08-17 DIAGNOSIS — Z8042 Family history of malignant neoplasm of prostate: Secondary | ICD-10-CM | POA: Diagnosis not present

## 2022-08-17 DIAGNOSIS — C50912 Malignant neoplasm of unspecified site of left female breast: Secondary | ICD-10-CM | POA: Diagnosis not present

## 2022-08-17 LAB — CBC WITH DIFFERENTIAL/PLATELET
Abs Immature Granulocytes: 0.01 10*3/uL (ref 0.00–0.07)
Basophils Absolute: 0 10*3/uL (ref 0.0–0.1)
Basophils Relative: 1 %
Eosinophils Absolute: 0.2 10*3/uL (ref 0.0–0.5)
Eosinophils Relative: 3 %
HCT: 36.6 % (ref 36.0–46.0)
Hemoglobin: 12.1 g/dL (ref 12.0–15.0)
Immature Granulocytes: 0 %
Lymphocytes Relative: 28 %
Lymphs Abs: 1.7 10*3/uL (ref 0.7–4.0)
MCH: 28.3 pg (ref 26.0–34.0)
MCHC: 33.1 g/dL (ref 30.0–36.0)
MCV: 85.5 fL (ref 80.0–100.0)
Monocytes Absolute: 0.8 10*3/uL (ref 0.1–1.0)
Monocytes Relative: 14 %
Neutro Abs: 3.2 10*3/uL (ref 1.7–7.7)
Neutrophils Relative %: 54 %
Platelets: 152 10*3/uL (ref 150–400)
RBC: 4.28 MIL/uL (ref 3.87–5.11)
RDW: 14.4 % (ref 11.5–15.5)
WBC: 5.9 10*3/uL (ref 4.0–10.5)
nRBC: 0 % (ref 0.0–0.2)

## 2022-08-17 LAB — VITAMIN D 25 HYDROXY (VIT D DEFICIENCY, FRACTURES): Vit D, 25-Hydroxy: 50.18 ng/mL (ref 30–100)

## 2022-08-17 LAB — COMPREHENSIVE METABOLIC PANEL
ALT: 12 U/L (ref 0–44)
AST: 26 U/L (ref 15–41)
Albumin: 3.3 g/dL — ABNORMAL LOW (ref 3.5–5.0)
Alkaline Phosphatase: 52 U/L (ref 38–126)
Anion gap: 9 (ref 5–15)
BUN: 16 mg/dL (ref 8–23)
CO2: 29 mmol/L (ref 22–32)
Calcium: 8.9 mg/dL (ref 8.9–10.3)
Chloride: 98 mmol/L (ref 98–111)
Creatinine, Ser: 0.69 mg/dL (ref 0.44–1.00)
GFR, Estimated: >60 mL/min (ref >60)
Glucose, Bld: 103 mg/dL — ABNORMAL HIGH (ref 70–99)
Potassium: 3.2 mmol/L — ABNORMAL LOW (ref 3.5–5.1)
Sodium: 136 mmol/L (ref 135–145)
Total Bilirubin: 0.5 mg/dL (ref 0.3–1.2)
Total Protein: 7.4 g/dL (ref 6.5–8.1)

## 2022-08-24 ENCOUNTER — Inpatient Hospital Stay: Payer: Medicare Other | Admitting: Hematology

## 2022-08-24 VITALS — BP 119/63 | HR 53 | Temp 98.4°F | Resp 18 | Ht 66.0 in | Wt 192.7 lb

## 2022-08-24 DIAGNOSIS — Z79811 Long term (current) use of aromatase inhibitors: Secondary | ICD-10-CM | POA: Diagnosis not present

## 2022-08-24 DIAGNOSIS — Z79899 Other long term (current) drug therapy: Secondary | ICD-10-CM | POA: Diagnosis not present

## 2022-08-24 DIAGNOSIS — Z803 Family history of malignant neoplasm of breast: Secondary | ICD-10-CM | POA: Diagnosis not present

## 2022-08-24 DIAGNOSIS — R232 Flushing: Secondary | ICD-10-CM | POA: Diagnosis not present

## 2022-08-24 DIAGNOSIS — C50912 Malignant neoplasm of unspecified site of left female breast: Secondary | ICD-10-CM | POA: Diagnosis not present

## 2022-08-24 DIAGNOSIS — C50919 Malignant neoplasm of unspecified site of unspecified female breast: Secondary | ICD-10-CM | POA: Diagnosis not present

## 2022-08-24 DIAGNOSIS — Z170 Estrogen receptor positive status [ER+]: Secondary | ICD-10-CM | POA: Diagnosis not present

## 2022-08-24 NOTE — Progress Notes (Signed)
[place] [address], [place] [address]   Patient Care Team: Sharilyn Sites, MD as PCP - General (Family Medicine) Derek Jack, MD as Medical Oncologist (Oncology) Brien Mates, RN as Oncology Nurse Navigator (Oncology)  SUMMARY OF ONCOLOGIC HISTORY: Oncology History   No history exists.    CHIEF COMPLIANT: Follow-up of invasive lobular carcinoma of the outer quadrant of the left breast   INTERVAL HISTORY: Ms. Leslie Barrera is a 82 y.o. female seen for follow-up of left breast cancer.  She is tolerating anastrozole very well.  Minor hot flashes reported.  Continuing calcium and vitamin D supplements.  No new onset pains.  REVIEW OF SYSTEMS:   Review of Systems  Constitutional:  Negative for appetite change and fatigue.  Endocrine: Positive for hot flashes (x1-2).  Musculoskeletal:  Negative for arthralgias.  All other systems reviewed and are negative.   I have reviewed the past medical history, past surgical history, social history and family history with the patient and they are unchanged from previous note.   ALLERGIES:   is allergic to tetracycline.   MEDICATIONS:  Current Outpatient Medications  Medication Sig Dispense Refill   anastrozole (ARIMIDEX) 1 MG tablet TAKE 1 TABLET BY MOUTH ONCE A DAY. 30 tablet 5   bisoprolol-hydrochlorothiazide (ZIAC) 10-6.25 MG per tablet Take 1 tablet by mouth daily.      Calcium Carbonate (CALCIUM 600 PO) Take by mouth.     cholecalciferol (VITAMIN D) 25 MCG (1000 UNIT) tablet Take 1,000 Units by mouth daily.      clobetasol cream (TEMOVATE) 0.05 % Apply topically 2 (two) times daily as needed.     clotrimazole (LOTRIMIN) 1 % cream Apply topically 2 (two) times daily.     furosemide (LASIX) 40 MG tablet Take 40 mg by mouth daily.      simvastatin (ZOCOR) 40 MG tablet Take 40 mg by mouth at bedtime.     No current facility-administered medications for this visit.     PHYSICAL  EXAMINATION: Performance status (ECOG): 1 - Symptomatic but completely ambulatory  Vitals:   08/24/22 1145  BP: 119/63  Pulse: (!) 53  Resp: 18  Temp: 98.4 F (36.9 C)  SpO2: 100%   Wt Readings from Last 3 Encounters:  08/24/22 192 lb 11.2 oz (87.4 kg)  06/10/22 183 lb 8 oz (83.2 kg)  02/21/22 178 lb 11.2 oz (81.1 kg)   Physical Exam Vitals reviewed.  Constitutional:      Appearance: Normal appearance. She is obese.  Cardiovascular:     Rate and Rhythm: Normal rate and regular rhythm.     Pulses: Normal pulses.     Heart sounds: Normal heart sounds.  Pulmonary:     Effort: Pulmonary effort is normal.     Breath sounds: Normal breath sounds.  Neurological:     General: No focal deficit present.     Mental Status: She is alert and oriented to person, place, and time.  Psychiatric:        Mood and Affect: Mood normal.        Behavior: Behavior normal.      LABORATORY DATA:  I have reviewed the data as listed    Latest Ref Rng & Units 08/17/2022   10:55 AM 02/15/2022   10:57 AM 04/27/2021   10:57 AM  CMP  Glucose 70 - 99 mg/dL 103  116  112   BUN 8 - 23 mg/dL 16  21  17  Creatinine 0.44 - 1.00 mg/dL 0.69  0.85  0.64   Sodium 135 - 145 mmol/L 136  136  138   Potassium 3.5 - 5.1 mmol/L 3.2  3.5  3.3   Chloride 98 - 111 mmol/L 98  100  103   CO2 22 - 32 mmol/L '29  28  29   '$ Calcium 8.9 - 10.3 mg/dL 8.9  9.5  9.3   Total Protein 6.5 - 8.1 g/dL 7.4  8.2  7.7   Total Bilirubin 0.3 - 1.2 mg/dL 0.5  0.6  0.6   Alkaline Phos 38 - 126 U/L 52  51  42   AST 15 - 41 U/L '26  23  23   '$ ALT 0 - 44 U/L '12  14  15    '$ No results found for: "CAN153" Lab Results  Component Value Date   WBC 5.9 08/17/2022   HGB 12.1 08/17/2022   HCT 36.6 08/17/2022   MCV 85.5 08/17/2022   PLT 152 08/17/2022   NEUTROABS 3.2 08/17/2022    ASSESSMENT:  1.  Invasive lobular carcinoma of the outer quadrant of the left breast: - Mammogram on 12/21/2020, BI-RADS 0 with possible distortion in the  left breast. - Ultrasound/mammogram on 12/30/2020-persistent outer left breast distortion without sonographic correlate.  No lymphadenopathy in the left axilla. - Biopsy on 01/15/2021 of the left breast outer quadrant-invasive lobular carcinoma, E-cadherin weakly positive.  ER-70%, PR-90%, HER2 1+, Ki-67 20%. - MRI of the breast on 02/23/2021 shows 2.2 x 2 x 1 cm irregular mass identified in the lower outer left breast compatible with malignancy.  No abnormal appearing lymph nodes.  Right breast was normal. - She decided to not to have surgery. - Anastrozole started around 02/25/2021.  2.  Social/family history: - Lives with husband at home.  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 - Brother had prostate cancer.  Maternal aunt had breast cancer.   PLAN:  1.  Invasive lobular carcinoma of the outer quadrant of the left breast: - She is tolerating anastrozole except very minor hot flashes. - Reviewed labs from 08/17/2022 which showed normal LFTs.  CBC was grossly normal. - MRI breast from 08/16/2022 showed stable left breast mass given limited study from motion artifact.  Patient reported that she was coughing at that time due to upper respiratory infection. - I have recommended that she continue anastrozole.  RTC 6 months.  Repeat MRI of the left breast with and without contrast.  2.  Bone health: - Bone density on 12/21/2020 with T-score of 0.4. - She will continue vitamin D 2000 units daily.  Vitamin D level is normal at 50.  Breast Cancer therapy associated bone loss: I have recommended calcium, Vitamin D and weight bearing exercises.  Orders placed this encounter:  No orders of the defined types were placed in this encounter.    The patient has a good understanding of the overall plan. She agrees with it. She will call with any problems that may develop before the next visit here.  Derek Jack, MD [place] [phone]

## 2022-08-24 NOTE — Patient Instructions (Addendum)
[place] at [facility] Discharge Instructions   You were seen and examined today by Dr. Delton Coombes.  He reviewed the results of your lab work which are normal/stable.   He reviewed the results of your MRI. The exam is limited in its results due to movement from the coughing, but they think the tumor in your breast has decreased in size.   Continue the anastrozole pill daily as prescribed.   We will see you back in 6 months. We will repeat lab work and a mammogram prior to your next visit.    Thank you for choosing [place] at [facility] to provide your oncology and hematology care.  To afford each patient quality time with our provider, please arrive at least 15 minutes before your scheduled appointment time.   If you have a lab appointment with the [place] please come in thru the Main Entrance and check in at the main information desk.  You need to re-schedule your appointment should you arrive 10 or more minutes late.  We strive to give you quality time with our providers, and arriving late affects you and other patients whose appointments are after yours.  Also, if you no show three or more times for appointments you may be dismissed from the clinic at the providers discretion.     Again, thank you for choosing [facility].  Our hope is that these requests will decrease the amount of time that you wait before being seen by our physicians.       _____________________________________________________________  Should you have questions after your visit to [facility], please contact our office at [phone] and follow the prompts.  Our office hours are 8:00 a.m. and 4:30 p.m. Monday - Friday.  Please note that voicemails left after 4:00 p.m. may not be returned until the following business day.  We are closed weekends and major holidays.  You do have access to a nurse 24-7, just call the main number to the  clinic [phone] and do not press any options, hold on the line and a nurse will answer the phone.    For prescription refill requests, have your pharmacy contact our office and allow 72 hours.    Due to Covid, you will need to wear a mask upon entering the hospital. If you do not have a mask, a mask will be given to you at the Main Entrance upon arrival. For doctor visits, patients may have 1 support person age 72 or older with them. For treatment visits, patients can not have anyone with them due to social distancing guidelines and our immunocompromised population.

## 2022-10-06 ENCOUNTER — Ambulatory Visit: Payer: Medicare Other | Admitting: Obstetrics & Gynecology

## 2022-10-06 ENCOUNTER — Encounter: Payer: Self-pay | Admitting: Obstetrics & Gynecology

## 2022-10-06 VITALS — BP 134/59 | HR 55 | Ht 66.0 in | Wt 186.0 lb

## 2022-10-06 DIAGNOSIS — Z4689 Encounter for fitting and adjustment of other specified devices: Secondary | ICD-10-CM | POA: Diagnosis not present

## 2022-10-06 DIAGNOSIS — N993 Prolapse of vaginal vault after hysterectomy: Secondary | ICD-10-CM | POA: Diagnosis not present

## 2022-10-06 NOTE — Progress Notes (Signed)
Chief Complaint  Patient presents with   Pessary Check    Blood pressure (!) 134/59, pulse (!) 55, height 5\' 6"  (1.676 m), weight 186 lb (84.4 kg).  Leslie Barrera presents today for routine follow up related to her pessary.   She uses a Milex ring with support #4 She reports no vaginal discharge and no vaginal bleeding   Likert scale(1 not bothersome -5 very bothersome)  :  1  Exam reveals no undue vaginal mucosal pressure of breakdown, no discharge and no vaginal bleeding.  Vaginal Epithelial Abnormality Classification System:   0 0    No abnormalities 1    Epithelial erythema 2    Granulation tissue 3    Epithelial break or erosion, 1 cm or less 4    Epithelial break or erosion, 1 cm or greater  The pessary is removed, cleaned and replaced without difficulty.      ICD-10-CM   1. Pessary maintenance, Milex ring with support #4, original fit 04/2018  Z46.89     2. Complete prolapse of vaginal vault s/p hysterectomy  N99.3        Leslie Barrera will be sen back in 4 months for continued follow up.  Lazaro Arms, MD  10/06/2022 9:06 AM

## 2022-10-07 DIAGNOSIS — R7309 Other abnormal glucose: Secondary | ICD-10-CM | POA: Diagnosis not present

## 2022-10-07 DIAGNOSIS — E119 Type 2 diabetes mellitus without complications: Secondary | ICD-10-CM | POA: Diagnosis not present

## 2022-10-07 DIAGNOSIS — E782 Mixed hyperlipidemia: Secondary | ICD-10-CM | POA: Diagnosis not present

## 2022-10-07 DIAGNOSIS — I10 Essential (primary) hypertension: Secondary | ICD-10-CM | POA: Diagnosis not present

## 2022-10-07 DIAGNOSIS — D649 Anemia, unspecified: Secondary | ICD-10-CM | POA: Diagnosis not present

## 2022-10-07 DIAGNOSIS — R946 Abnormal results of thyroid function studies: Secondary | ICD-10-CM | POA: Diagnosis not present

## 2022-10-07 DIAGNOSIS — E559 Vitamin D deficiency, unspecified: Secondary | ICD-10-CM | POA: Diagnosis not present

## 2022-10-07 DIAGNOSIS — E7849 Other hyperlipidemia: Secondary | ICD-10-CM | POA: Diagnosis not present

## 2022-10-07 DIAGNOSIS — Z0001 Encounter for general adult medical examination with abnormal findings: Secondary | ICD-10-CM | POA: Diagnosis not present

## 2022-10-10 ENCOUNTER — Ambulatory Visit: Payer: Medicare Other | Admitting: Obstetrics & Gynecology

## 2022-10-19 ENCOUNTER — Other Ambulatory Visit: Payer: Self-pay | Admitting: Hematology

## 2023-01-03 ENCOUNTER — Ambulatory Visit: Payer: Medicare Other | Admitting: Obstetrics & Gynecology

## 2023-01-03 ENCOUNTER — Encounter: Payer: Self-pay | Admitting: Obstetrics & Gynecology

## 2023-01-03 VITALS — BP 152/81 | HR 61 | Ht 66.0 in | Wt 185.8 lb

## 2023-01-03 DIAGNOSIS — N993 Prolapse of vaginal vault after hysterectomy: Secondary | ICD-10-CM | POA: Diagnosis not present

## 2023-01-03 DIAGNOSIS — Z4689 Encounter for fitting and adjustment of other specified devices: Secondary | ICD-10-CM | POA: Diagnosis not present

## 2023-01-03 NOTE — Progress Notes (Signed)
Chief Complaint  Patient presents with   Follow-up    Pessary maintenance    Blood pressure (!) 152/81, pulse 61, height 5\' 6"  (1.676 m), weight 185 lb 12.8 oz (84.3 kg).  Leslie Barrera presents today for routine follow up related to her pessary.   She uses a Milex ring with support #4 She reports no vaginal discharge and no vaginal bleeding   Likert scale(1 not bothersome -5 very bothersome)  :  1  Exam reveals no undue vaginal mucosal pressure of breakdown, no discharge and no vaginal bleeding.  Vaginal Epithelial Abnormality Classification System:   0 0    No abnormalities 1    Epithelial erythema 2    Granulation tissue 3    Epithelial break or erosion, 1 cm or less 4    Epithelial break or erosion, 1 cm or greater  The pessary is removed, cleaned and replaced without difficulty.      ICD-10-CM   1. Pessary maintenance, Milex ring with support #4, original fit 04/2018  Z46.89     2. Complete prolapse of vaginal vault s/p hysterectomy  N99.3        [name] will be sen back in 4 months for continued follow up.  Lazaro Arms, MD  01/03/2023 10:38 AM

## 2023-01-13 DIAGNOSIS — U071 COVID-19: Secondary | ICD-10-CM | POA: Diagnosis not present

## 2023-01-13 DIAGNOSIS — I10 Essential (primary) hypertension: Secondary | ICD-10-CM | POA: Diagnosis not present

## 2023-02-17 ENCOUNTER — Inpatient Hospital Stay: Payer: Medicare Other | Attending: Hematology

## 2023-02-17 DIAGNOSIS — C50512 Malignant neoplasm of lower-outer quadrant of left female breast: Secondary | ICD-10-CM | POA: Insufficient documentation

## 2023-02-17 DIAGNOSIS — Z170 Estrogen receptor positive status [ER+]: Secondary | ICD-10-CM | POA: Diagnosis not present

## 2023-02-17 DIAGNOSIS — Z79811 Long term (current) use of aromatase inhibitors: Secondary | ICD-10-CM | POA: Insufficient documentation

## 2023-02-17 DIAGNOSIS — C50919 Malignant neoplasm of unspecified site of unspecified female breast: Secondary | ICD-10-CM

## 2023-02-17 LAB — CBC WITH DIFFERENTIAL/PLATELET
Abs Immature Granulocytes: 0.02 10*3/uL (ref 0.00–0.07)
Basophils Absolute: 0 10*3/uL (ref 0.0–0.1)
Basophils Relative: 0 %
Eosinophils Absolute: 0.1 10*3/uL (ref 0.0–0.5)
Eosinophils Relative: 2 %
HCT: 37.4 % (ref 36.0–46.0)
Hemoglobin: 12.3 g/dL (ref 12.0–15.0)
Immature Granulocytes: 0 %
Lymphocytes Relative: 27 %
Lymphs Abs: 2.1 10*3/uL (ref 0.7–4.0)
MCH: 28.2 pg (ref 26.0–34.0)
MCHC: 32.9 g/dL (ref 30.0–36.0)
MCV: 85.8 fL (ref 80.0–100.0)
Monocytes Absolute: 0.8 10*3/uL (ref 0.1–1.0)
Monocytes Relative: 11 %
Neutro Abs: 4.6 10*3/uL (ref 1.7–7.7)
Neutrophils Relative %: 60 %
Platelets: 177 10*3/uL (ref 150–400)
RBC: 4.36 MIL/uL (ref 3.87–5.11)
RDW: 14.9 % (ref 11.5–15.5)
WBC: 7.7 10*3/uL (ref 4.0–10.5)
nRBC: 0 % (ref 0.0–0.2)

## 2023-02-17 LAB — COMPREHENSIVE METABOLIC PANEL
ALT: 13 U/L (ref 0–44)
AST: 21 U/L (ref 15–41)
Albumin: 3.6 g/dL (ref 3.5–5.0)
Alkaline Phosphatase: 57 U/L (ref 38–126)
Anion gap: 8 (ref 5–15)
BUN: 17 mg/dL (ref 8–23)
CO2: 30 mmol/L (ref 22–32)
Calcium: 9.7 mg/dL (ref 8.9–10.3)
Chloride: 98 mmol/L (ref 98–111)
Creatinine, Ser: 0.81 mg/dL (ref 0.44–1.00)
GFR, Estimated: >60 mL/min (ref >60)
Glucose, Bld: 96 mg/dL (ref 70–99)
Potassium: 3.6 mmol/L (ref 3.5–5.1)
Sodium: 136 mmol/L (ref 135–145)
Total Bilirubin: 0.5 mg/dL (ref 0.3–1.2)
Total Protein: 8 g/dL (ref 6.5–8.1)

## 2023-02-17 LAB — VITAMIN D 25 HYDROXY (VIT D DEFICIENCY, FRACTURES): Vit D, 25-Hydroxy: 54.24 ng/mL (ref 30–100)

## 2023-02-22 ENCOUNTER — Ambulatory Visit (HOSPITAL_COMMUNITY)
Admission: RE | Admit: 2023-02-22 | Discharge: 2023-02-22 | Disposition: A | Discharge status: Home / Self Care | Payer: Medicare Other | Source: Ambulatory Visit | Attending: Hematology | Admitting: Hematology

## 2023-02-22 DIAGNOSIS — C50919 Malignant neoplasm of unspecified site of unspecified female breast: Secondary | ICD-10-CM

## 2023-02-22 DIAGNOSIS — C50512 Malignant neoplasm of lower-outer quadrant of left female breast: Secondary | ICD-10-CM | POA: Diagnosis not present

## 2023-02-22 MED ORDER — GADOBUTROL 1 MMOL/ML IV SOLN
9.0000 mL | Freq: Once | INTRAVENOUS | Status: AC | PRN
  Administered 2023-02-22: 9 mL via INTRAVENOUS

## 2023-02-23 ENCOUNTER — Other Ambulatory Visit: Payer: Self-pay | Admitting: Hematology

## 2023-02-23 ENCOUNTER — Inpatient Hospital Stay: Payer: Medicare Other | Admitting: Hematology

## 2023-02-23 DIAGNOSIS — C50919 Malignant neoplasm of unspecified site of unspecified female breast: Secondary | ICD-10-CM

## 2023-02-23 DIAGNOSIS — C50512 Malignant neoplasm of lower-outer quadrant of left female breast: Secondary | ICD-10-CM | POA: Diagnosis not present

## 2023-02-23 DIAGNOSIS — Z170 Estrogen receptor positive status [ER+]: Secondary | ICD-10-CM | POA: Diagnosis not present

## 2023-02-23 DIAGNOSIS — Z79811 Long term (current) use of aromatase inhibitors: Secondary | ICD-10-CM | POA: Diagnosis not present

## 2023-02-23 MED ORDER — ANASTROZOLE 1 MG PO TABS: 1.0000 mg | ORAL_TABLET | Freq: Every day | ORAL | 5 refills | Status: DC

## 2023-02-23 NOTE — Progress Notes (Signed)
[name] [address] [place] [address]    Clinic Day:  03/14/2023  Referring physician: Assunta Found, MD  Patient Care Team: Assunta Found, MD as PCP - General (Family Medicine) Doreatha Massed, MD as Medical Oncologist (Oncology) Therese Sarah, RN as Oncology Nurse Navigator (Oncology)   ASSESSMENT & PLAN:   Assessment: 1.  Invasive lobular carcinoma of the outer quadrant of the left breast: - Mammogram on 12/21/2020, BI-RADS 0 with possible distortion in the left breast. - Ultrasound/mammogram on 12/30/2020-persistent outer left breast distortion without sonographic correlate.  No lymphadenopathy in the left axilla. - Biopsy on 01/15/2021 of the left breast outer quadrant-invasive lobular carcinoma, E-cadherin weakly positive.  ER-70%, PR-90%, HER2 1+, Ki-67 20%. - MRI of the breast on 02/23/2021 shows 2.2 x 2 x 1 cm irregular mass identified in the lower outer left breast compatible with malignancy.  No abnormal appearing lymph nodes.  Right breast was normal. - She decided to not to have surgery. - Anastrozole started around 02/25/2021.  2.  Social/family history: - Lives with husband at home.  She worked for a Publishing rights manager.  Never smoker.  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 - Brother had prostate cancer.  Maternal aunt had breast cancer.   Plan: 1.  Invasive lobular carcinoma of the outer quadrant of the left breast: - She is tolerating anastrozole very well. - MRI breast on 02/22/2023: Interval improvement with decrease in overall extent of enhancement.  No evidence of right breast malignancy.  No axillary adenopathy. - Labs from 02/17/2023: Normal LFTs and CBC. - RTC 6 months for follow-up with bilateral diagnostic mammogram and labs.  Continue anastrozole daily.  2.  Bone health: - Bone density on 12/21/2020 was T-score of 0.4. - Continue vitamin D supplements.  Vitamin D level is 54.   Breast Cancer therapy associated bone loss: I have  recommended calcium, Vitamin D and weight bearing exercises.  Orders Placed This Encounter  Procedures   MM 3D DIAGNOSTIC MAMMOGRAM BILATERAL BREAST    NAS No implants NMD-[facility]    Standing Status:   Future    Standing Expiration Date:   02/23/2024    Order Specific Question:   Reason for Exam (SYMPTOM  OR DIAGNOSIS REQUIRED)    Answer:   personal hx of breast cancer    Order Specific Question:   Preferred imaging location?    Answer:   [facility]   CBC with Differential/Platelet    Standing Status:   Future    Standing Expiration Date:   02/23/2024    Order Specific Question:   Release to patient    Answer:   Immediate   Comprehensive metabolic panel    Standing Status:   Future    Standing Expiration Date:   02/23/2024    Order Specific Question:   Release to patient    Answer:   Immediate      I,Helena R Teague,acting as a scribe for Doreatha Massed, MD.,have documented all relevant documentation on the behalf of Doreatha Massed, MD,as directed by  Doreatha Massed, MD while in the presence of Doreatha Massed, MD.   I, Doreatha Massed MD, have reviewed the above documentation for accuracy and completeness, and I agree with the above.   Doreatha Massed, MD   9/17/20246:43 PM  CHIEF COMPLAINT:   Diagnosis: Invasive lobular carcinoma of breast in female    Cancer Staging  Invasive lobular carcinoma of breast in female [facility]) Staging form: Breast,  AJCC 8th Edition - Clinical stage from 02/11/2021: Stage Unknown (cTX, cN0, cM0, ER+, PR+, HER2-) - Unsigned    Current Therapy:  Anastrozole   HISTORY OF PRESENT ILLNESS:   Oncology History   No history exists.     INTERVAL HISTORY:   Leslie Barrera is a 82 y.o. female presenting to clinic today for follow up of Invasive lobular carcinoma of breast in female. She was last seen by me on 08/24/22.  Since her last visit, she underwent a bilateral breast MRI on 02/22/23 that found: subtle low level  enhancement in the posterior, lower outer left breast, associated with artifact from the ribbon shaped post biopsy marker clip, reflecting the biopsy-proven lobular carcinoma with interval improvement and a decrease in the overall extent of the enhancement as well as a decrease in the enhancement intensity; no evidence of additional sites of left breast malignancy; no evidence of right breast malignancy; and no evidence of axillary lymphadenopathy.  Today, she states that she is doing well overall. Her appetite level is at 100%. Her energy level is at 75%.  She is tolerating Anastrozole well without any issues. She took her last pill this morning and has not been given a refill by her pharmacy. She notes that getting a MM hurts.    PAST MEDICAL HISTORY:   Past Medical History: Past Medical History:  Diagnosis Date   Breast cancer (HCC)    High cholesterol    HTN (hypertension)    Vitamin D deficiency     Surgical History: Past Surgical History:  Procedure Laterality Date   CATARACT EXTRACTION W/PHACO Left 05/04/2020   Procedure: CATARACT EXTRACTION PHACO AND INTRAOCULAR LENS PLACEMENT (IOC);  Surgeon: Fabio Pierce, MD;  Location: [facility];  Service: Ophthalmology;  Laterality: Left;  CDE: 12.29   CATARACT EXTRACTION W/PHACO Right 05/18/2020   Procedure: CATARACT EXTRACTION PHACO AND INTRAOCULAR LENS PLACEMENT RIGHT EYE;  Surgeon: Fabio Pierce, MD;  Location: [facility] ORS;  Service: Ophthalmology;  Laterality: Right;  CDE 17.41   COLONOSCOPY N/A 04/07/2015   Procedure: COLONOSCOPY;  Surgeon: Franky Macho, MD;  Location: [facility] SUITE;  Service: Gastroenterology;  Laterality: N/A;   left knee arthroscopy  2006   [facility]   left-great toe bunion     Small toe surgery     TOTAL KNEE ARTHROPLASTY Right 2009   VESICOVAGINAL FISTULA CLOSURE W/ TAH      Social History: Social History   Socioeconomic History   Marital status: Married    Spouse name: Not on file   Number of children: 1    Years of education: Not on file   Highest education level: Not on file  Occupational History   Not on file  Tobacco Use   Smoking status: Never   Smokeless tobacco: Never  Vaping Use   Vaping status: Never Used  Substance and Sexual Activity   Alcohol use: No   Drug use: No   Sexual activity: Not Currently    Birth control/protection: Surgical    Comment: hyst  Other Topics Concern   Not on file  Social History Narrative   Not on file   Social Determinants of Health   Financial Resource Strain: Low Risk  (02/11/2021)   Overall Financial Resource Strain (CARDIA)    Difficulty of Paying Living Expenses: Not hard at all  Food Insecurity: No Food Insecurity (02/11/2021)   Hunger Vital Sign    Worried About Running Out of Food in the Last Year: Never true    Ran Out  of Food in the Last Year: Never true  Transportation Needs: No Transportation Needs (02/11/2021)   PRAPARE - Administrator, Civil Service (Medical): No    Lack of Transportation (Non-Medical): No  Physical Activity: Sufficiently Active (02/11/2021)   Exercise Vital Sign    Days of Exercise per Week: 7 days    Minutes of Exercise per Session: 30 min  Stress: No Stress Concern Present (02/11/2021)   [employer] of Occupational Health - Occupational Stress Questionnaire    Feeling of Stress : Not at all  Social Connections: Moderately Integrated (02/11/2021)   Social Connection and Isolation Panel [NHANES]    Frequency of Communication with Friends and Family: More than three times a week    Frequency of Social Gatherings with Friends and Family: More than three times a week    Attends Religious Services: More than 4 times per year    Active Member of [employer] or Organizations: No    Attends Banker Meetings: Never    Marital Status: Married  Catering manager Violence: Not At Risk (02/11/2021)   Humiliation, Afraid, Rape, and Kick questionnaire    Fear of Current or Ex-Partner: No     Emotionally Abused: No    Physically Abused: No    Sexually Abused: No    Family History: Family History  Problem Relation Age of Onset   Heart disease Mother    Alzheimer's disease Father    Other Father        hardening of the arteries   Alzheimer's disease Sister    Heart attack Brother    Prostate cancer Brother     Current Medications:  Current Outpatient Medications:    anastrozole (ARIMIDEX) 1 MG tablet, Take 1 tablet (1 mg total) by mouth daily., Disp: 30 tablet, Rfl: 5   bisoprolol-hydrochlorothiazide (ZIAC) 10-6.25 MG per tablet, Take 1 tablet by mouth daily. , Disp: , Rfl:    Calcium Carbonate (CALCIUM 600 PO), Take by mouth., Disp: , Rfl:    cholecalciferol (VITAMIN D) 25 MCG (1000 UNIT) tablet, Take 1,000 Units by mouth daily. , Disp: , Rfl:    clobetasol cream (TEMOVATE) 0.05 %, Apply topically 2 (two) times daily as needed., Disp: , Rfl:    clotrimazole (LOTRIMIN) 1 % cream, Apply topically 2 (two) times daily., Disp: , Rfl:    furosemide (LASIX) 40 MG tablet, Take 40 mg by mouth daily. , Disp: , Rfl:    simvastatin (ZOCOR) 40 MG tablet, Take 40 mg by mouth at bedtime., Disp: , Rfl:    Allergies: Allergies  Allergen Reactions   Tetracycline Rash    REVIEW OF SYSTEMS:   Review of Systems  Constitutional:  Negative for chills, fatigue and fever.  HENT:   Negative for lump/mass, mouth sores, nosebleeds, sore throat and trouble swallowing.   Eyes:  Negative for eye problems.  Respiratory:  Negative for cough and shortness of breath.   Cardiovascular:  Positive for leg swelling (left). Negative for chest pain and palpitations.  Gastrointestinal:  Positive for constipation. Negative for abdominal pain, diarrhea, nausea and vomiting.  Genitourinary:  Negative for bladder incontinence, difficulty urinating, dysuria, frequency, hematuria and nocturia.   Musculoskeletal:  Negative for arthralgias, back pain, flank pain, myalgias and neck pain.  Skin:  Positive for  itching (on back). Negative for rash.  Neurological:  Positive for numbness (right hand). Negative for dizziness and headaches.  Hematological:  Does not bruise/bleed easily.  Psychiatric/Behavioral:  Negative for depression, sleep disturbance  and suicidal ideas. The patient is not nervous/anxious.   All other systems reviewed and are negative.    VITALS:   There were no vitals taken for this visit.  Wt Readings from Last 3 Encounters:  01/03/23 185 lb 12.8 oz (84.3 kg)  10/06/22 186 lb (84.4 kg)  08/24/22 192 lb 11.2 oz (87.4 kg)    There is no height or weight on file to calculate BMI.  Performance status (ECOG): 1 - Symptomatic but completely ambulatory  PHYSICAL EXAM:   Physical Exam Vitals and nursing note reviewed. Exam conducted with a chaperone present.  Constitutional:      Appearance: Normal appearance.  Cardiovascular:     Rate and Rhythm: Normal rate and regular rhythm.     Pulses: Normal pulses.     Heart sounds: Normal heart sounds.  Pulmonary:     Effort: Pulmonary effort is normal.     Breath sounds: Normal breath sounds.  Abdominal:     Palpations: Abdomen is soft. There is no hepatomegaly, splenomegaly or mass.     Tenderness: There is no abdominal tenderness.  Musculoskeletal:     Right lower leg: No edema.     Left lower leg: No edema.  Lymphadenopathy:     Cervical: No cervical adenopathy.     Right cervical: No superficial, deep or posterior cervical adenopathy.    Left cervical: No superficial, deep or posterior cervical adenopathy.     Upper Body:     Right upper body: No supraclavicular or axillary adenopathy.     Left upper body: No supraclavicular or axillary adenopathy.  Neurological:     General: No focal deficit present.     Mental Status: She is alert and oriented to person, place, and time.  Psychiatric:        Mood and Affect: Mood normal.        Behavior: Behavior normal.    Breast Exam Chaperone: Anne Fu, LPN   LABS:       Latest Ref Rng & Units 02/17/2023   12:36 PM 08/17/2022   10:55 AM 02/15/2022   10:57 AM  CBC  WBC 4.0 - 10.5 K/uL 7.7  5.9  7.8   Hemoglobin 12.0 - 15.0 g/dL 40.9  81.1  91.4   Hematocrit 36.0 - 46.0 % 37.4  36.6  37.2   Platelets 150 - 400 K/uL 177  152  188       Latest Ref Rng & Units 02/17/2023   12:36 PM 08/17/2022   10:55 AM 02/15/2022   10:57 AM  CMP  Glucose 70 - 99 mg/dL 96  782  956   BUN 8 - 23 mg/dL 17  16  21    Creatinine 0.44 - 1.00 mg/dL 2.13  0.86  5.78   Sodium 135 - 145 mmol/L 136  136  136   Potassium 3.5 - 5.1 mmol/L 3.6  3.2  3.5   Chloride 98 - 111 mmol/L 98  98  100   CO2 22 - 32 mmol/L 30  29  28    Calcium 8.9 - 10.3 mg/dL 9.7  8.9  9.5   Total Protein 6.5 - 8.1 g/dL 8.0  7.4  8.2   Total Bilirubin 0.3 - 1.2 mg/dL 0.5  0.5  0.6   Alkaline Phos 38 - 126 U/L 57  52  51   AST 15 - 41 U/L 21  26  23    ALT 0 - 44 U/L 13  12  14  No results found for: "CEA1", "CEA" / No results found for: "CEA1", "CEA" No results found for: "PSA1" No results found for: "KGU542" No results found for: "CAN125"  No results found for: "TOTALPROTELP", "ALBUMINELP", "A1GS", "A2GS", "BETS", "BETA2SER", "GAMS", "MSPIKE", "SPEI" No results found for: "TIBC", "FERRITIN", "IRONPCTSAT" No results found for: "LDH"   STUDIES:   MR BREAST BILATERAL W WO CONTRAST INC CAD  Result Date: 02/22/2023 CLINICAL DATA:  History of invasive lobular carcinoma of the left breast. Patient declined surgery and has been undergoing chemoprevention with anastrozole. Current exam is to assess for chemoprevention response. EXAM: BILATERAL BREAST MRI WITH AND WITHOUT CONTRAST TECHNIQUE: Multiplanar, multisequence MR images of both breasts were obtained prior to and following the intravenous administration of 9 ml of Gadavist Three-dimensional MR images were rendered by post-processing of the original MR data on an independent workstation. The three-dimensional MR images were interpreted, and  findings are reported in the following complete MRI report for this study. Three dimensional images were evaluated at the independent interpreting workstation using the DynaCAD thin client. COMPARISON:  Prior exams including multiple previous breast MRIs, most recent dated 08/16/2022. FINDINGS: Breast composition: b. Scattered fibroglandular tissue. Background parenchymal enhancement: Mild Right breast: No mass or abnormal enhancement. Left breast: No defined mass or new areas of abnormal enhancement. Small focus of susceptibility artifact with subtle associated distortion lies in posterior, lower outer left breast reflecting the biopsy proven invasive lobular carcinoma. There is minimal adjacent low level enhancement, spanning approximately 1.1 cm, decreased in extent compared to the exam dated 02/09/2022, as well as less intense and defined. It is more convincingly improved when compared to the exam dated 02/23/2021. There is another focus of susceptibility artifact in the lower outer left breast, central depth, representing the coil shaped post biopsy marker clip from a benign stereotactic biopsy performed on 12/13/2016. Lymph nodes: No abnormal appearing lymph nodes. Ancillary findings:  None. IMPRESSION: 1. Subtle low level enhancement in the posterior, lower outer left breast, associated with artifact from the ribbon shaped post biopsy marker clip, reflecting the biopsy-proven lobular carcinoma. Allowing for technical differences between prior exams, there has been interval improvement with a decrease in the overall extent of the enhancement as well as a decrease in the enhancement intensity. 2. No evidence of additional sites of left breast malignancy. 3. No evidence of right breast malignancy. 4. No evidence of axillary lymphadenopathy. RECOMMENDATION: 1. Continue current treatment with chemo prevention. 2. Annual bilateral diagnostic mammography. Last bilateral mammography noted on the exam list is from  12/21/2020. Patient is therefore currently due. 3. Recommend follow-up breast MRI without and with contrast in 1 year. BI-RADS CATEGORY  6: Known biopsy-proven malignancy. Electronically Signed   By: Amie Portland M.D.   On: 02/22/2023 14:42

## 2023-02-23 NOTE — Patient Instructions (Signed)
[facility]  Discharge Instructions  You were seen and examined today by Dr. Ellin Saba.  Dr. Ellin Saba discussed your most recent lab work and CT scan which revealed that everything looks good.  Follow-up as scheduled in 6 months.    Thank you for choosing [facility] to provide your oncology and hematology care.   To afford each patient quality time with our provider, please arrive at least 15 minutes before your scheduled appointment time. You may need to reschedule your appointment if you arrive late (10 or more minutes). Arriving late affects you and other patients whose appointments are after yours.  Also, if you miss three or more appointments without notifying the office, you may be dismissed from the clinic at the provider's discretion.    Again, thank you for choosing [facility].  Our hope is that these requests will decrease the amount of time that you wait before being seen by our physicians.   If you have a lab appointment with the Cancer Center - please note that after April 8th, all labs will be drawn in the cancer center.  You do not have to check in or register with the main entrance as you have in the past but will complete your check-in at the cancer center.            _____________________________________________________________  Should you have questions after your visit to [facility], please contact our office at [phone] and follow the prompts.  Our office hours are 8:00 a.m. to 4:30 p.m. Monday - Thursday and 8:00 a.m. to 2:30 p.m. Friday.  Please note that voicemails left after 4:00 p.m. may not be returned until the following business day.  We are closed weekends and all major holidays.  You do have access to a nurse 24-7, just call the main number to the clinic [phone] and do not press any options, hold on the line and a nurse will answer the phone.    For prescription  refill requests, have your pharmacy contact our office and allow 72 hours.    Masks are no longer required in the cancer centers. If you would like for your care team to wear a mask while they are taking care of you, please let them know. You may have one support person who is at least 83 years old accompany you for your appointments.

## 2023-04-10 DIAGNOSIS — Z23 Encounter for immunization: Secondary | ICD-10-CM | POA: Diagnosis not present

## 2023-04-17 DIAGNOSIS — M1712 Unilateral primary osteoarthritis, left knee: Secondary | ICD-10-CM | POA: Diagnosis not present

## 2023-05-09 ENCOUNTER — Ambulatory Visit: Payer: Medicare Other | Admitting: Obstetrics & Gynecology

## 2023-05-09 ENCOUNTER — Encounter: Payer: Self-pay | Admitting: Obstetrics & Gynecology

## 2023-05-09 VITALS — BP 139/80 | HR 80

## 2023-05-09 DIAGNOSIS — N993 Prolapse of vaginal vault after hysterectomy: Secondary | ICD-10-CM | POA: Diagnosis not present

## 2023-05-09 DIAGNOSIS — Z4689 Encounter for fitting and adjustment of other specified devices: Secondary | ICD-10-CM

## 2023-05-09 NOTE — Progress Notes (Signed)
Chief Complaint  Patient presents with   Pessary Check    Blood pressure 139/80, pulse 80.  Leslie Barrera presents today for routine follow up related to her pessary.   She uses a Milex ring with support #4 She reports no vaginal discharge and no vaginal bleeding   Likert scale(1 not bothersome -5 very bothersome)  :  1  Exam reveals no undue vaginal mucosal pressure of breakdown, no discharge and no vaginal bleeding.  Vaginal Epithelial Abnormality Classification System:   0 0    No abnormalities 1    Epithelial erythema 2    Granulation tissue 3    Epithelial break or erosion, 1 cm or less 4    Epithelial break or erosion, 1 cm or greater  The pessary is removed, cleaned and replaced without difficulty.      ICD-10-CM   1. Pessary maintenance, Milex ring with support #4, original fit 04/2018  Z46.89     2. Complete prolapse of vaginal vault s/p hysterectomy  N99.3        ELDEEN KARY will be sen back in 4 months for continued follow up.  Lazaro Arms, MD  05/09/2023 9:09 AM

## 2023-07-12 DIAGNOSIS — I10 Essential (primary) hypertension: Secondary | ICD-10-CM | POA: Diagnosis not present

## 2023-07-12 DIAGNOSIS — M19031 Primary osteoarthritis, right wrist: Secondary | ICD-10-CM | POA: Diagnosis not present

## 2023-07-12 DIAGNOSIS — M1712 Unilateral primary osteoarthritis, left knee: Secondary | ICD-10-CM | POA: Diagnosis not present

## 2023-07-12 DIAGNOSIS — M654 Radial styloid tenosynovitis [de Quervain]: Secondary | ICD-10-CM | POA: Diagnosis not present

## 2023-08-29 ENCOUNTER — Encounter (HOSPITAL_COMMUNITY): Payer: Self-pay

## 2023-08-29 ENCOUNTER — Ambulatory Visit (HOSPITAL_COMMUNITY)
Admission: RE | Admit: 2023-08-29 | Discharge: 2023-08-29 | Disposition: A | Discharge status: Home / Self Care | Payer: Medicare Other | Source: Ambulatory Visit | Attending: Hematology | Admitting: Hematology

## 2023-08-29 ENCOUNTER — Inpatient Hospital Stay: Payer: Medicare Other | Attending: Hematology

## 2023-08-29 DIAGNOSIS — C50919 Malignant neoplasm of unspecified site of unspecified female breast: Secondary | ICD-10-CM | POA: Diagnosis present

## 2023-08-29 DIAGNOSIS — Z79899 Other long term (current) drug therapy: Secondary | ICD-10-CM | POA: Diagnosis not present

## 2023-08-29 DIAGNOSIS — R92333 Mammographic heterogeneous density, bilateral breasts: Secondary | ICD-10-CM | POA: Diagnosis not present

## 2023-08-29 DIAGNOSIS — Z79811 Long term (current) use of aromatase inhibitors: Secondary | ICD-10-CM | POA: Insufficient documentation

## 2023-08-29 DIAGNOSIS — C50512 Malignant neoplasm of lower-outer quadrant of left female breast: Secondary | ICD-10-CM | POA: Diagnosis not present

## 2023-08-29 DIAGNOSIS — Z170 Estrogen receptor positive status [ER+]: Secondary | ICD-10-CM | POA: Diagnosis not present

## 2023-08-29 DIAGNOSIS — Z1732 Human epidermal growth factor receptor 2 negative status: Secondary | ICD-10-CM | POA: Diagnosis not present

## 2023-08-29 DIAGNOSIS — Z9889 Other specified postprocedural states: Secondary | ICD-10-CM | POA: Insufficient documentation

## 2023-08-29 DIAGNOSIS — Z1721 Progesterone receptor positive status: Secondary | ICD-10-CM | POA: Insufficient documentation

## 2023-08-29 DIAGNOSIS — M8580 Other specified disorders of bone density and structure, unspecified site: Secondary | ICD-10-CM | POA: Diagnosis not present

## 2023-08-29 DIAGNOSIS — Z803 Family history of malignant neoplasm of breast: Secondary | ICD-10-CM | POA: Insufficient documentation

## 2023-08-29 LAB — CBC WITH DIFFERENTIAL/PLATELET
Abs Immature Granulocytes: 0.03 10*3/uL (ref 0.00–0.07)
Basophils Absolute: 0 10*3/uL (ref 0.0–0.1)
Basophils Relative: 1 %
Eosinophils Absolute: 0.2 10*3/uL (ref 0.0–0.5)
Eosinophils Relative: 2 %
HCT: 37 % (ref 36.0–46.0)
Hemoglobin: 12 g/dL (ref 12.0–15.0)
Immature Granulocytes: 0 %
Lymphocytes Relative: 25 %
Lymphs Abs: 2 10*3/uL (ref 0.7–4.0)
MCH: 27.6 pg (ref 26.0–34.0)
MCHC: 32.4 g/dL (ref 30.0–36.0)
MCV: 85.3 fL (ref 80.0–100.0)
Monocytes Absolute: 1 10*3/uL (ref 0.1–1.0)
Monocytes Relative: 12 %
Neutro Abs: 4.9 10*3/uL (ref 1.7–7.7)
Neutrophils Relative %: 60 %
Platelets: 175 10*3/uL (ref 150–400)
RBC: 4.34 MIL/uL (ref 3.87–5.11)
RDW: 14.9 % (ref 11.5–15.5)
WBC: 8.1 10*3/uL (ref 4.0–10.5)
nRBC: 0 % (ref 0.0–0.2)

## 2023-08-29 LAB — COMPREHENSIVE METABOLIC PANEL
ALT: 12 U/L (ref 0–44)
AST: 21 U/L (ref 15–41)
Albumin: 3.6 g/dL (ref 3.5–5.0)
Alkaline Phosphatase: 56 U/L (ref 38–126)
Anion gap: 8 (ref 5–15)
BUN: 19 mg/dL (ref 8–23)
CO2: 29 mmol/L (ref 22–32)
Calcium: 10 mg/dL (ref 8.9–10.3)
Chloride: 101 mmol/L (ref 98–111)
Creatinine, Ser: 0.67 mg/dL (ref 0.44–1.00)
GFR, Estimated: >60 mL/min (ref >60)
Glucose, Bld: 87 mg/dL (ref 70–99)
Potassium: 3.6 mmol/L (ref 3.5–5.1)
Sodium: 138 mmol/L (ref 135–145)
Total Bilirubin: 0.5 mg/dL (ref 0.0–1.2)
Total Protein: 7.6 g/dL (ref 6.5–8.1)

## 2023-09-05 ENCOUNTER — Other Ambulatory Visit: Payer: Self-pay | Admitting: Hematology

## 2023-09-05 ENCOUNTER — Inpatient Hospital Stay: Payer: Medicare Other | Admitting: Hematology

## 2023-09-05 VITALS — BP 157/69 | HR 54 | Temp 97.9°F | Resp 16 | Wt 183.2 lb

## 2023-09-05 DIAGNOSIS — C50919 Malignant neoplasm of unspecified site of unspecified female breast: Secondary | ICD-10-CM

## 2023-09-05 DIAGNOSIS — Z803 Family history of malignant neoplasm of breast: Secondary | ICD-10-CM | POA: Diagnosis not present

## 2023-09-05 DIAGNOSIS — Z79899 Other long term (current) drug therapy: Secondary | ICD-10-CM | POA: Diagnosis not present

## 2023-09-05 DIAGNOSIS — Z1732 Human epidermal growth factor receptor 2 negative status: Secondary | ICD-10-CM | POA: Diagnosis not present

## 2023-09-05 DIAGNOSIS — Z170 Estrogen receptor positive status [ER+]: Secondary | ICD-10-CM | POA: Diagnosis not present

## 2023-09-05 DIAGNOSIS — M8580 Other specified disorders of bone density and structure, unspecified site: Secondary | ICD-10-CM | POA: Diagnosis not present

## 2023-09-05 DIAGNOSIS — Z79811 Long term (current) use of aromatase inhibitors: Secondary | ICD-10-CM | POA: Diagnosis not present

## 2023-09-05 DIAGNOSIS — Z1721 Progesterone receptor positive status: Secondary | ICD-10-CM | POA: Diagnosis not present

## 2023-09-05 DIAGNOSIS — C50512 Malignant neoplasm of lower-outer quadrant of left female breast: Secondary | ICD-10-CM | POA: Diagnosis not present

## 2023-09-05 NOTE — Patient Instructions (Addendum)
 [facility] Discharge Instructions   You were seen and examined today by Dr. Ellin Saba.  He reviewed the results of your lab work which are normal/stable.   Your mammogram is showing that the tumor in the breast is stable.   Continue anastrozole as prescribed.   We will see you back in one year.   Return as scheduled.    Thank you for choosing  Can[facility] Center at [facility] to provide your oncology and hematology care.  To afford each patient quality time with our provider, please arrive at least 15 minutes before your scheduled appointment time.   If you have a lab appointment with the Cancer Center please come in thru the Main Entrance and check in at the main information desk.  You need to re-schedule your appointment should you arrive 10 or more minutes late.  We strive to give you quality time with our providers, and arriving late affects you and other patients whose appointments are after yours.  Also, if you no show three or more times for appointments you may be dismissed from the clinic at the providers discretion.     Again, thank you for choosing [facility].  Our hope is that these requests will decrease the amount of time that you wait before being seen by our physicians.       _____________________________________________________________  Should you have questions after your visit to [facility], please contact our office at [phone] and follow the prompts.  Our office hours are 8:00 a.m. and 4:30 p.m. Monday - Friday.  Please note that voicemails left after 4:00 p.m. may not be returned until the following business day.  We are closed weekends and major holidays.  You do have access to a nurse 24-7, just call the main number to the clinic [phone] and do not press any options, hold on the line and a nurse will answer the phone.    For prescription refill requests, have your pharmacy  contact our office and allow 72 hours.    Due to Covid, you will need to wear a mask upon entering the hospital. If you do not have a mask, a mask will be given to you at the Main Entrance upon arrival. For doctor visits, patients may have 1 support person age 103 or older with them. For treatment visits, patients can not have anyone with them due to social distancing guidelines and our immunocompromised population.

## 2023-09-05 NOTE — Progress Notes (Signed)
 [facility] [address] [place] [address]    Clinic Day:  09/05/2023  Referring physician: Assunta Found, MD  Patient Care Team: Leslie Found, MD as PCP - General (Family Medicine) Leslie Massed, MD as Medical Oncologist (Oncology) Leslie Sarah, RN as Oncology Nurse Navigator (Oncology)   ASSESSMENT & PLAN:   Assessment: 1.  Invasive lobular carcinoma of the outer quadrant of the left breast: - Mammogram on 12/21/2020, BI-RADS 0 with possible distortion in the left breast. - Ultrasound/mammogram on 12/30/2020-persistent outer left breast distortion without sonographic correlate.  No lymphadenopathy in the left axilla. - Biopsy on 01/15/2021 of the left breast outer quadrant-invasive lobular carcinoma, E-cadherin weakly positive.  ER-70%, PR-90%, HER2 1+, Ki-67 20%. - MRI of the breast on 02/23/2021 shows 2.2 x 2 x 1 cm irregular mass identified in the lower outer left breast compatible with malignancy.  No abnormal appearing lymph nodes.  Right breast was normal. - She decided to not to have surgery. - Anastrozole started around 02/25/2021.  2.  Social/family history: - Lives with husband at home.  She worked for a Publishing rights manager.  Never smoker.  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 - Brother had prostate cancer.  Maternal aunt had breast cancer.   Plan: 1.  Invasive lobular carcinoma of the outer quadrant of the left breast: - She is tolerating anastrozole reasonably well.  Mild hot flashes present. - Reviewed labs from 08/29/2023: Normal LFTs.  CBC grossly normal. - Reviewed mammogram from 08/29/2023: Stable to less apparent distortion in the outer left breast at the site of biopsy.  No new masses seen in both breasts. - Physical exam: No palpable adenopathy. - Labs: Normal LFTs and CBC. - Continue anastrozole daily until progression.  As her 51-month scans have been staying stable, we will switch her to 1 year follow-up with repeat diagnostic mammogram  and labs.  2.  Bone health (DEXA on 12/21/2020 with T-score 0.4): - Previous DEXA scan in June 2022 was normal. - She is continuing calcium and vitamin D supplements.  Last vitamin D level is 54. - Will plan to repeat DEXA scan prior to next visit.   Breast Cancer therapy associated bone loss: I have recommended calcium, Vitamin D and weight bearing exercises.  Orders Placed This Encounter  Procedures   DG Bone Density    Standing Status:   Future    Expected Date:   03/07/2024    Expiration Date:   09/04/2024    Reason for Exam (SYMPTOM  OR DIAGNOSIS REQUIRED):   anti-estrogen therapy    Preferred imaging location?:   [facility]   MM DIAG BREAST TOMO BILATERAL    NAS No implants [facility]    Standing Status:   Future    Expiration Date:   09/04/2024    Reason for Exam (SYMPTOM  OR DIAGNOSIS REQUIRED):   Invasive lobular carcinoma of the outer quadrant of the left breast    Preferred imaging location?:   [facility]      I,Leslie Barrera,acting as a scribe for Leslie Massed, MD.,have documented all relevant documentation on the behalf of Leslie Massed, MD,as directed by  Leslie Massed, MD while in the presence of Leslie Massed, MD.  I, Leslie Massed MD, have reviewed the above documentation for accuracy and completeness, and I agree with the above.    Leslie Massed, MD   3/11/20253:55 PM  CHIEF COMPLAINT:   Diagnosis: Invasive lobular carcinoma of breast  in female    Cancer Staging  Invasive lobular carcinoma of breast in female [facility]) Staging form: Breast, AJCC 8th Edition - Clinical stage from 02/11/2021: Stage Unknown (cTX, cN0, cM0, ER+, PR+, HER2-) - Unsigned    Current Therapy:  Anastrozole   HISTORY OF PRESENT ILLNESS:   Oncology History   No history exists.     INTERVAL HISTORY:   Leslie Barrera is a 83 y.o. female presenting to clinic today for follow up of Invasive lobular carcinoma of breast in female. She  was last seen by me on 02/23/23.  Since her last visit, she underwent diagnostic mammogram on 08/29/23 that Barrera: Stable to less apparent appearance of distortion in the outer left breast at site of biopsy proven invasive lobular carcinoma. No new masses or abnormalities identified in the left breast and no mammographic evidence of malignancy in the right breast.  Today, she states that she is doing well overall. Her appetite level is at 100%. Her energy level is at 75%. She is tolerating Anastrozole well and denies any hot flashes, joint pains, or new onset pains. Leslie Barrera is taking Calcium and Vitamin D supplements.   PAST MEDICAL HISTORY:   Past Medical History: Past Medical History:  Diagnosis Date   Breast cancer (HCC) 12/2020   invasive lobular ca   High cholesterol    HTN (hypertension)    Vitamin D deficiency     Surgical History: Past Surgical History:  Procedure Laterality Date   BREAST BIOPSY Left 12/2020   invasive lobular ca   CATARACT EXTRACTION W/PHACO Left 05/04/2020   Procedure: CATARACT EXTRACTION PHACO AND INTRAOCULAR LENS PLACEMENT (IOC);  Surgeon: Leslie Pierce, MD;  Location: [facility]S;  Service: Ophthalmology;  Laterality: Left;  CDE: 12.29   CATARACT EXTRACTION W/PHACO Right 05/18/2020   Procedure: CATARACT EXTRACTION PHACO AND INTRAOCULAR LENS PLACEMENT RIGHT EYE;  Surgeon: Leslie Pierce, MD;  Location: [facility] ORS;  Service: Ophthalmology;  Laterality: Right;  CDE 17.41   COLONOSCOPY N/A 04/07/2015   Procedure: COLONOSCOPY;  Surgeon: Leslie Macho, MD;  Location: [facility] END[facility] SUITE;  Service: Gastroenterology;  Laterality: N/A;   left knee arthroscopy  06/27/2004   [facility]   left-great toe bunion     Small toe surgery     TOTAL KNEE ARTHROPLASTY Right 06/28/2007   VESICOVAGINAL FISTULA CLOSURE W/ TAH      Social History: Social History   Socioeconomic History   Marital status: Married    Spouse name: Not on file   Number of children: 1   Years of education: Not  on file   Highest education level: Not on file  Occupational History   Not on file  Tobacco Use   Smoking status: Never   Smokeless tobacco: Never  Vaping Use   Vaping status: Never Used  Substance and Sexual Activity   Alcohol use: No   Drug use: No   Sexual activity: Not Currently    Birth control/protection: Surgical    Comment: hyst  Other Topics Concern   Not on file  Social History Narrative   Not on file   Social Drivers of Health   Financial Resource Strain: Low Risk  (02/11/2021)   Overall Financial Resource Strain (CARDIA)    Difficulty of Paying Living Expenses: Not hard at all  Food Insecurity: No Food Insecurity (02/11/2021)   Hunger Vital Sign    Worried About Running Out of Food in the Last Year: Never true    Ran Out of Food in the Last Year: Never  true  Transportation Needs: No Transportation Needs (02/11/2021)   PRAPARE - Administrator, Civil Service (Medical): No    Lack of Transportation (Non-Medical): No  Physical Activity: Sufficiently Active (02/11/2021)   Exercise Vital Sign    Days of Exercise per Week: 7 days    Minutes of Exercise per Session: 30 min  Stress: No Stress Concern Present (02/11/2021)   [employer] of Occupational Health - Occupational Stress Questionnaire    Feeling of Stress : Not at all  Social Connections: Moderately Integrated (02/11/2021)   Social Connection and Isolation Panel [NHANES]    Frequency of Communication with Friends and Family: More than three times a week    Frequency of Social Gatherings with Friends and Family: More than three times a week    Attends Religious Services: More than 4 times per year    Active Member of [employer] or Organizations: No    Attends Banker Meetings: Never    Marital Status: Married  Catering manager Violence: Not At Risk (02/11/2021)   Humiliation, Afraid, Rape, and Kick questionnaire    Fear of Current or Ex-Partner: No    Emotionally Abused: No     Physically Abused: No    Sexually Abused: No    Family History: Family History  Problem Relation Age of Onset   Heart disease Mother    Alzheimer's disease Father    Other Father        hardening of the arteries   Alzheimer's disease Sister    Heart attack Brother    Prostate cancer Brother     Current Medications:  Current Outpatient Medications:    anastrozole (ARIMIDEX) 1 MG tablet, Take 1 tablet (1 mg total) by mouth daily., Disp: 30 tablet, Rfl: 5   bisoprolol-hydrochlorothiazide (ZIAC) 10-6.25 MG per tablet, Take 1 tablet by mouth daily. , Disp: , Rfl:    Calcium Carbonate (CALCIUM 600 PO), Take by mouth., Disp: , Rfl:    cholecalciferol (VITAMIN D) 25 MCG (1000 UNIT) tablet, Take 1,000 Units by mouth daily. , Disp: , Rfl:    furosemide (LASIX) 40 MG tablet, Take 40 mg by mouth daily. , Disp: , Rfl:    simvastatin (ZOCOR) 40 MG tablet, Take 40 mg by mouth at bedtime., Disp: , Rfl:    Allergies: Allergies  Allergen Reactions   Tetracycline Rash    REVIEW OF SYSTEMS:   Review of Systems  Constitutional:  Negative for chills, fatigue and fever.  HENT:   Negative for lump/mass, mouth sores, nosebleeds, sore throat and trouble swallowing.   Eyes:  Negative for eye problems.  Respiratory:  Negative for cough and shortness of breath.   Cardiovascular:  Negative for chest pain, leg swelling and palpitations.  Gastrointestinal:  Positive for constipation. Negative for abdominal pain, diarrhea, nausea and vomiting.  Genitourinary:  Negative for bladder incontinence, difficulty urinating, dysuria, frequency, hematuria and nocturia.   Musculoskeletal:  Negative for arthralgias, back pain, flank pain, myalgias and neck pain.  Skin:  Negative for itching and rash.  Neurological:  Positive for numbness. Negative for dizziness and headaches.  Hematological:  Does not bruise/bleed easily.  Psychiatric/Behavioral:  Negative for depression, sleep disturbance and suicidal ideas. The  patient is not nervous/anxious.   All other systems reviewed and are negative.    VITALS:   Blood pressure (!) 157/69, pulse (!) 54, temperature 97.9 F (36.6 C), temperature source Oral, resp. rate 16, weight 183 lb 3.2 oz (83.1 kg), SpO2 95%.  Wt Readings from Last 3 Encounters:  09/05/23 183 lb 3.2 oz (83.1 kg)  01/03/23 185 lb 12.8 oz (84.3 kg)  10/06/22 186 lb (84.4 kg)    Body mass index is 29.57 kg/m.  Performance status (ECOG): 1 - Symptomatic but completely ambulatory  PHYSICAL EXAM:   Physical Exam Vitals and nursing note reviewed. Exam conducted with a chaperone present.  Constitutional:      Appearance: Normal appearance.  Cardiovascular:     Rate and Rhythm: Normal rate and regular rhythm.     Pulses: Normal pulses.     Heart sounds: Normal heart sounds.  Pulmonary:     Effort: Pulmonary effort is normal.     Breath sounds: Normal breath sounds.  Abdominal:     Palpations: Abdomen is soft. There is no hepatomegaly, splenomegaly or mass.     Tenderness: There is no abdominal tenderness.  Musculoskeletal:     Right lower leg: No edema.     Left lower leg: No edema.  Lymphadenopathy:     Cervical: No cervical adenopathy.     Right cervical: No superficial, deep or posterior cervical adenopathy.    Left cervical: No superficial, deep or posterior cervical adenopathy.     Upper Body:     Right upper body: No supraclavicular or axillary adenopathy.     Left upper body: No supraclavicular or axillary adenopathy.  Neurological:     General: No focal deficit present.     Mental Status: She is alert and oriented to person, place, and time.  Psychiatric:        Mood and Affect: Mood normal.        Behavior: Behavior normal.    Breast Exam Chaperone: Anne Fu, LPN   LABS:      Latest Ref Rng & Units 08/29/2023   10:41 AM 02/17/2023   12:36 PM 08/17/2022   10:55 AM  CBC  WBC 4.0 - 10.5 K/uL 8.1  7.7  5.9   Hemoglobin 12.0 - 15.0 g/dL 14.7  82.9  56.2    Hematocrit 36.0 - 46.0 % 37.0  37.4  36.6   Platelets 150 - 400 K/uL 175  177  152       Latest Ref Rng & Units 08/29/2023   10:41 AM 02/17/2023   12:36 PM 08/17/2022   10:55 AM  CMP  Glucose 70 - 99 mg/dL 87  96  130   BUN 8 - 23 mg/dL 19  17  16    Creatinine 0.44 - 1.00 mg/dL 8.65  7.84  6.96   Sodium 135 - 145 mmol/L 138  136  136   Potassium 3.5 - 5.1 mmol/L 3.6  3.6  3.2   Chloride 98 - 111 mmol/L 101  98  98   CO2 22 - 32 mmol/L 29  30  29    Calcium 8.9 - 10.3 mg/dL 29.5  9.7  8.9   Total Protein 6.5 - 8.1 g/dL 7.6  8.0  7.4   Total Bilirubin 0.0 - 1.2 mg/dL 0.5  0.5  0.5   Alkaline Phos 38 - 126 U/L 56  57  52   AST 15 - 41 U/L 21  21  26    ALT 0 - 44 U/L 12  13  12       No results Barrera for: "CEA1", "CEA" / No results Barrera for: "CEA1", "CEA" No results Barrera for: "PSA1" No results Barrera for: "MWU132" No results Barrera for: "CAN125"  No results Barrera for: "TOTALPROTELP", "  ALBUMINELP", "A1GS", "A2GS", "BETS", "BETA2SER", "GAMS", "MSPIKE", "SPEI" No results Barrera for: "TIBC", "FERRITIN", "IRONPCTSAT" No results Barrera for: "LDH"   STUDIES:   MM 3D DIAGNOSTIC MAMMOGRAM BILATERAL BREAST Result Date: 08/29/2023 CLINICAL DATA:  83 year old female with history left breast invasive lobular carcinoma diagnosed following stereotactic guided core biopsy distortion July 2022. The patient has opted for medical therapy with anastrozole rather than surgery. EXAM: DIGITAL DIAGNOSTIC BILATERAL MAMMOGRAM WITH TOMOSYNTHESIS AND CAD TECHNIQUE: Bilateral digital diagnostic mammography and breast tomosynthesis was performed. The images were evaluated with computer-aided detection. COMPARISON:  Previous exam(s). ACR Breast Density Category c: The breasts are heterogeneously dense, which may obscure small masses. FINDINGS: No suspicious masses or calcifications are seen in the right breast. A ribbon shaped marking clip is present and identified to be located 1.2 cm medial to the biopsied distortion  in the left breast. The previously seen distortion in the slightly outer left breast is less apparent on today's exam. There are new new masses or abnormalities identified in the left breast mammographically. IMPRESSION: 1. Stable to less apparent appearance of distortion in the outer left breast at site of biopsy proven invasive lobular carcinoma. 2. No new masses or abnormalities identified in the left breast and no mammographic evidence of malignancy in the right breast. RECOMMENDATION: Treatment plan for known left breast malignancy. I have discussed the findings and recommendations with the patient. If applicable, a reminder letter will be sent to the patient regarding the next appointment. BI-RADS CATEGORY  6: Known biopsy-proven malignancy. Electronically Signed   By: Edwin Cap M.D.   On: 08/29/2023 10:33

## 2023-09-07 ENCOUNTER — Encounter: Payer: Self-pay | Admitting: Obstetrics & Gynecology

## 2023-09-07 ENCOUNTER — Ambulatory Visit: Payer: Medicare Other | Admitting: Obstetrics & Gynecology

## 2023-09-07 VITALS — BP 138/70 | HR 49

## 2023-09-07 DIAGNOSIS — N993 Prolapse of vaginal vault after hysterectomy: Secondary | ICD-10-CM

## 2023-09-07 DIAGNOSIS — Z4689 Encounter for fitting and adjustment of other specified devices: Secondary | ICD-10-CM

## 2023-09-07 NOTE — Progress Notes (Signed)
 No chief complaint on file.   Blood pressure 138/70, pulse (!) 49.  Leslie Barrera presents today for routine follow up related to her pessary.   She uses a Milex ring with support #4 She reports no vaginal discharge and no vaginal bleeding   Likert scale(1 not bothersome -5 very bothersome)  :  1  Exam reveals no undue vaginal mucosal pressure of breakdown, no discharge and no vaginal bleeding.  Vaginal Epithelial Abnormality Classification System:   0 0    No abnormalities 1    Epithelial erythema 2    Granulation tissue 3    Epithelial break or erosion, 1 cm or less 4    Epithelial break or erosion, 1 cm or greater  The pessary is removed, cleaned and replaced without difficulty.      ICD-10-CM   1. Pessary maintenance, Milex ring with support #4, original fit 04/2018  Z46.89     2. Complete prolapse of vaginal vault s/p hysterectomy  N99.3        LYNDON CHENOWETH will be sen back in 4 months for continued follow up.  Lazaro Arms, MD  09/07/2023 9:54 AM

## 2023-10-17 ENCOUNTER — Other Ambulatory Visit: Payer: Self-pay | Admitting: Hematology

## 2023-10-17 DIAGNOSIS — I10 Essential (primary) hypertension: Secondary | ICD-10-CM | POA: Diagnosis not present

## 2023-10-17 DIAGNOSIS — R7309 Other abnormal glucose: Secondary | ICD-10-CM | POA: Diagnosis not present

## 2023-10-17 DIAGNOSIS — E782 Mixed hyperlipidemia: Secondary | ICD-10-CM | POA: Diagnosis not present

## 2023-10-17 DIAGNOSIS — E559 Vitamin D deficiency, unspecified: Secondary | ICD-10-CM | POA: Diagnosis not present

## 2023-10-17 DIAGNOSIS — R946 Abnormal results of thyroid function studies: Secondary | ICD-10-CM | POA: Diagnosis not present

## 2023-10-17 DIAGNOSIS — Z0001 Encounter for general adult medical examination with abnormal findings: Secondary | ICD-10-CM | POA: Diagnosis not present

## 2023-10-17 DIAGNOSIS — D649 Anemia, unspecified: Secondary | ICD-10-CM | POA: Diagnosis not present

## 2023-10-17 DIAGNOSIS — E7849 Other hyperlipidemia: Secondary | ICD-10-CM | POA: Diagnosis not present

## 2023-12-27 DIAGNOSIS — M542 Cervicalgia: Secondary | ICD-10-CM | POA: Diagnosis not present

## 2024-01-04 ENCOUNTER — Encounter: Payer: Self-pay | Admitting: Obstetrics & Gynecology

## 2024-01-04 ENCOUNTER — Ambulatory Visit: Admitting: Obstetrics & Gynecology

## 2024-01-04 VITALS — BP 151/68 | HR 67 | Ht 63.0 in | Wt 182.0 lb

## 2024-01-04 DIAGNOSIS — N993 Prolapse of vaginal vault after hysterectomy: Secondary | ICD-10-CM | POA: Diagnosis not present

## 2024-01-04 DIAGNOSIS — Z4689 Encounter for fitting and adjustment of other specified devices: Secondary | ICD-10-CM

## 2024-01-04 NOTE — Progress Notes (Signed)
 Chief Complaint  Patient presents with   Pessary Check    Blood pressure (!) 151/68, pulse 67, height 5' 3 (1.6 m), weight 182 lb (82.6 kg).  Leslie Barrera presents today for routine follow up related to her pessary.   She uses a Milex ring with support #4 She reports no vaginal discharge and no vaginal bleeding   Likert scale(1 not bothersome -5 very bothersome)  :  1  Exam reveals no undue vaginal mucosal pressure of breakdown, no discharge and no vaginal bleeding.  Vaginal Epithelial Abnormality Classification System:   0 0    No abnormalities 1    Epithelial erythema 2    Granulation tissue 3    Epithelial break or erosion, 1 cm or less 4    Epithelial break or erosion, 1 cm or greater  The pessary is removed, cleaned and replaced without difficulty.      ICD-10-CM   1. Pessary maintenance, Milex ring with support #4, original fit 04/2018  Z46.89     2. Complete prolapse of vaginal vault s/p hysterectomy  N99.3        SANTANA [name] will be sen back in 4 months for continued follow up.  Vonn VEAR Inch, MD  01/04/2024 11:27 AM

## 2024-02-07 ENCOUNTER — Ambulatory Visit: Admitting: Family Medicine

## 2024-02-07 ENCOUNTER — Encounter: Payer: Self-pay | Admitting: Family Medicine

## 2024-02-07 VITALS — BP 125/67 | HR 64 | Temp 97.2°F | Ht 63.0 in | Wt 179.0 lb

## 2024-02-07 DIAGNOSIS — M150 Primary generalized (osteo)arthritis: Secondary | ICD-10-CM

## 2024-02-07 DIAGNOSIS — Z4689 Encounter for fitting and adjustment of other specified devices: Secondary | ICD-10-CM

## 2024-02-07 DIAGNOSIS — I10 Essential (primary) hypertension: Secondary | ICD-10-CM

## 2024-02-07 DIAGNOSIS — C50919 Malignant neoplasm of unspecified site of unspecified female breast: Secondary | ICD-10-CM | POA: Diagnosis not present

## 2024-02-07 DIAGNOSIS — E785 Hyperlipidemia, unspecified: Secondary | ICD-10-CM

## 2024-02-07 DIAGNOSIS — M159 Polyosteoarthritis, unspecified: Secondary | ICD-10-CM | POA: Insufficient documentation

## 2024-02-07 MED ORDER — BISOPROLOL-HYDROCHLOROTHIAZIDE 10-6.25 MG PO TABS: 1.0000 | ORAL_TABLET | Freq: Every day | ORAL | 3 refills | Status: AC

## 2024-02-07 NOTE — Progress Notes (Signed)
 Subjective:  Patient ID: Leslie Barrera, female    DOB: January 11, 1941  Age: 83 y.o. MRN: [id number]  CC:   Chief Complaint  Patient presents with   New Patient (Initial Visit)    HPI:  83 year old female presents to establish care.  Patient states that she is doing well.  Blood pressure is well-controlled on Lasix and bisoprolol /HCTZ.  Patient follows with OB/GYN regarding her pessary.  Patient has current breast cancer.  She has elected against surgery.  She is currently on anastrozole  and tolerating.  She follows with oncology.  Recent labs normal/unremarkable.  Patient states that she is feeling well.  She has no complaints or concerns at this time.  Patient Active Problem List   Diagnosis Date Noted   Pessary maintenance 02/07/2024   Osteoarthritis, multiple sites 02/07/2024   Essential hypertension 02/07/2024   Hyperlipidemia 02/07/2024   Invasive lobular carcinoma of breast in female [facility]) 02/11/2021   Status post total right knee replacement 12/08/09 01/14/2014    Social Hx   Social History   Socioeconomic History   Marital status: Married    Spouse name: Not on file   Number of children: 1   Years of education: Not on file   Highest education level: Not on file  Occupational History   Not on file  Tobacco Use   Smoking status: Never   Smokeless tobacco: Never  Vaping Use   Vaping status: Never Used  Substance and Sexual Activity   Alcohol use: No   Drug use: No   Sexual activity: Not Currently    Birth control/protection: Surgical    Comment: hyst  Other Topics Concern   Not on file  Social History Narrative   Not on file   Social Drivers of Health   Financial Resource Strain: Low Risk  (02/11/2021)   Overall Financial Resource Strain (CARDIA)    Difficulty of Paying Living Expenses: Not hard at all  Food Insecurity: No Food Insecurity (02/11/2021)   Hunger Vital Sign    Worried About Running Out of Food in the Last Year: Never true    Ran Out  of Food in the Last Year: Never true  Transportation Needs: No Transportation Needs (02/11/2021)   PRAPARE - Administrator, Civil Service (Medical): No    Lack of Transportation (Non-Medical): No  Physical Activity: Sufficiently Active (02/11/2021)   Exercise Vital Sign    Days of Exercise per Week: 7 days    Minutes of Exercise per Session: 30 min  Stress: No Stress Concern Present (02/11/2021)   [employer] of Occupational Health - Occupational Stress Questionnaire    Feeling of Stress : Not at all  Social Connections: Moderately Integrated (02/11/2021)   Social Connection and Isolation Panel    Frequency of Communication with Friends and Family: More than three times a week    Frequency of Social Gatherings with Friends and Family: More than three times a week    Attends Religious Services: More than 4 times per year    Active Member of [employer] or Organizations: No    Attends Engineer, structural: Never    Marital Status: Married    Review of Systems Per HPI  Objective:  BP 125/67   Pulse 64   Temp (!) 97.2 F (36.2 C)   Ht 5' 3 (1.6 m)   Wt 179 lb (81.2 kg)   SpO2 99%   BMI 31.71 kg/m      02/07/2024  9:50 AM 01/04/2024   11:12 AM 01/04/2024   11:09 AM  BP/Weight  Systolic BP 125 151 171  Diastolic BP 67 68 51  Wt. (Lbs) 179  182  BMI 31.71 kg/m2  32.24 kg/m2    Physical Exam Vitals and nursing note reviewed.  Constitutional:      General: She is not in acute distress.    Appearance: Normal appearance.  HENT:     Head: Normocephalic and atraumatic.  Cardiovascular:     Rate and Rhythm: Normal rate and regular rhythm.  Pulmonary:     Effort: Pulmonary effort is normal.     Breath sounds: Normal breath sounds.  Neurological:     Mental Status: She is alert.  Psychiatric:        Mood and Affect: Mood normal.        Behavior: Behavior normal.     Lab Results  Component Value Date   WBC 8.1 08/29/2023   HGB 12.0 08/29/2023    HCT 37.0 08/29/2023   PLT 175 08/29/2023   GLUCOSE 87 08/29/2023   ALT 12 08/29/2023   AST 21 08/29/2023   NA 138 08/29/2023   K 3.6 08/29/2023   CL 101 08/29/2023   CREATININE 0.67 08/29/2023   BUN 19 08/29/2023   CO2 29 08/29/2023   INR 0.99 12/03/2009     Assessment & Plan:  Essential hypertension Assessment & Plan: Stable.  Continue current medications.  Orders: -     Bisoprolol -hydroCHLOROthiazide ; Take 1 tablet by mouth daily. Take 1 tablet by mouth daily.  Dispense: 90 tablet; Refill: 3  Pessary maintenance Assessment & Plan: Stable.  Follows with GYN.   Primary osteoarthritis involving multiple joints  Invasive lobular carcinoma of breast in female [facility]) Assessment & Plan: Follows with oncology.  Continue anastrozole .   Hyperlipidemia, unspecified hyperlipidemia type Assessment & Plan: No recent lipid panels available.  Continue simvastatin.  Need records of labs from PCP.     Follow-up: 6 months  Zahlia Deshazer Bluford DO [facility]

## 2024-02-07 NOTE — Assessment & Plan Note (Signed)
 Stable.  Continue current medications.

## 2024-02-07 NOTE — Assessment & Plan Note (Signed)
Stable.  Follows with GYN

## 2024-02-07 NOTE — Assessment & Plan Note (Signed)
 Follows with oncology.  Continue anastrozole .

## 2024-02-07 NOTE — Assessment & Plan Note (Signed)
 No recent lipid panels available.  Continue simvastatin.  Need records of labs from PCP.

## 2024-02-07 NOTE — Patient Instructions (Signed)
Continue your meds.  Follow up in 6 months.

## 2024-05-06 ENCOUNTER — Encounter: Payer: Self-pay | Admitting: Adult Health

## 2024-05-06 ENCOUNTER — Ambulatory Visit: Admitting: Adult Health

## 2024-05-06 VITALS — BP 145/76 | HR 40 | Ht 63.0 in | Wt 180.0 lb

## 2024-05-06 DIAGNOSIS — N993 Prolapse of vaginal vault after hysterectomy: Secondary | ICD-10-CM | POA: Diagnosis not present

## 2024-05-06 DIAGNOSIS — I10 Essential (primary) hypertension: Secondary | ICD-10-CM

## 2024-05-06 DIAGNOSIS — Z4689 Encounter for fitting and adjustment of other specified devices: Secondary | ICD-10-CM | POA: Diagnosis not present

## 2024-05-06 NOTE — Progress Notes (Signed)
  Subjective:     Patient ID: Leslie Barrera, female   DOB: 1941-03-25, 83 y.o.   MRN: [id number]  HPI Kagan is a 83 year old black female,married sp hysterectomy in for pessary maintenance. She denies any bleedin odor or discharge.  PCP is Dr Bluford  Review of Systems For pessary maintenance Denies any bleeding, odor or vaginal discharge Reviewed past medical,surgical, social and family history. Reviewed medications and allergies.     Objective:   Physical Exam BP (!) 145/76 (BP Location: Left Arm, Patient Position: Sitting, Cuff Size: Normal)   Pulse (!) 40   Ht 5' 3 (1.6 m)   Wt 180 lb (81.6 kg)   BMI 31.89 kg/m     Skin warm and dry.Pelvic: external genitalia is normal in appearance no lesions, vagina: pessary removed,(#4 ring with support) no vaginal irritation noted, +prolapse, absent cervix and uterus, pessary washed with soap and water  and dried and easily reinserted. Examination chaperoned by Clarita Salt LPN   Fall risk is low  [facility] - 05/06/24 0958       Pregnancy Intention Screening   Does the patient want to become pregnant in the next year? N/A    Does the patient's partner want to become pregnant in the next year? N/A    Would the patient like to discuss contraceptive options today? N/A      Contraception Wrap Up   Current Method Female Sterilization   hyst   End Method Female Sterilization   hyst   Contraception Counseling Provided No          Assessment:    1. Pessary maintenance, Milex ring with support #4, original fit 04/2018 (Primary) Cleaned and reinserted   2. Complete prolapse of vaginal vault s/p hysterectomy  3. Essential hypertension Take  Ziac  10-6.25 mg 1 daily  and lasix and follow up with PCP     Plan:     Follow up in 3 months for pessary maintenance or sooner if needed

## 2024-05-14 ENCOUNTER — Other Ambulatory Visit: Payer: Self-pay | Admitting: *Deleted

## 2024-05-14 MED ORDER — ANASTROZOLE 1 MG PO TABS: 1.0000 mg | ORAL_TABLET | Freq: Every day | ORAL | 6 refills | Status: AC

## 2024-05-14 NOTE — Telephone Encounter (Signed)
 Patient tolerating Anastrozole  without difficulty.  To continue therapy per last OVN.

## 2024-08-06 ENCOUNTER — Ambulatory Visit: Admitting: Adult Health

## 2024-08-12 ENCOUNTER — Ambulatory Visit: Admitting: Family Medicine

## 2024-09-03 ENCOUNTER — Other Ambulatory Visit (HOSPITAL_COMMUNITY)

## 2024-09-03 ENCOUNTER — Encounter (HOSPITAL_COMMUNITY)

## 2024-09-03 ENCOUNTER — Inpatient Hospital Stay

## 2024-09-03 ENCOUNTER — Other Ambulatory Visit

## 2024-09-03 ENCOUNTER — Inpatient Hospital Stay (HOSPITAL_COMMUNITY): Admission: RE | Admit: 2024-09-03 | Source: Ambulatory Visit

## 2024-09-10 ENCOUNTER — Inpatient Hospital Stay: Admitting: Physician Assistant
